# Patient Record
Sex: Female | Born: 1990 | Race: White | Hispanic: No | Marital: Single | Smoking: Current some day smoker
Health system: Southern US, Community
[De-identification: ages and names within clinical notes are randomized; demographics above are authoritative.]

## PROBLEM LIST (undated history)

## (undated) ENCOUNTER — Inpatient Hospital Stay (HOSPITAL_COMMUNITY): Payer: Self-pay

## (undated) DIAGNOSIS — F329 Major depressive disorder, single episode, unspecified: Secondary | ICD-10-CM

## (undated) DIAGNOSIS — K219 Gastro-esophageal reflux disease without esophagitis: Secondary | ICD-10-CM

## (undated) DIAGNOSIS — F1010 Alcohol abuse, uncomplicated: Secondary | ICD-10-CM

## (undated) DIAGNOSIS — F39 Unspecified mood [affective] disorder: Secondary | ICD-10-CM

## (undated) DIAGNOSIS — F32A Depression, unspecified: Secondary | ICD-10-CM

## (undated) HISTORY — DX: Major depressive disorder, single episode, unspecified: F32.9

## (undated) HISTORY — DX: Alcohol abuse, uncomplicated: F10.10

## (undated) HISTORY — DX: Unspecified mood (affective) disorder: F39

## (undated) HISTORY — DX: Depression, unspecified: F32.A

---
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
## 2009-07-28 ENCOUNTER — Inpatient Hospital Stay (HOSPITAL_COMMUNITY): Admission: EM | Admit: 2009-07-28 | Discharge: 2009-07-31 | Payer: Self-pay | Admitting: Emergency Medicine

## 2009-07-31 ENCOUNTER — Ambulatory Visit: Payer: Self-pay | Admitting: Psychiatry

## 2009-07-31 ENCOUNTER — Inpatient Hospital Stay (HOSPITAL_COMMUNITY): Admission: AD | Admit: 2009-07-31 | Discharge: 2009-08-02 | Payer: Self-pay | Admitting: Psychiatry

## 2009-09-28 ENCOUNTER — Ambulatory Visit: Payer: Self-pay

## 2009-12-20 ENCOUNTER — Observation Stay: Payer: Self-pay | Admitting: Internal Medicine

## 2010-03-08 ENCOUNTER — Ambulatory Visit: Payer: Self-pay | Admitting: Family Medicine

## 2010-03-12 DIAGNOSIS — F10982 Alcohol use, unspecified with alcohol-induced sleep disorder: Secondary | ICD-10-CM

## 2010-03-12 DIAGNOSIS — F329 Major depressive disorder, single episode, unspecified: Secondary | ICD-10-CM

## 2010-03-12 DIAGNOSIS — R634 Abnormal weight loss: Secondary | ICD-10-CM

## 2010-03-12 DIAGNOSIS — F1010 Alcohol abuse, uncomplicated: Secondary | ICD-10-CM

## 2010-03-12 LAB — CONVERTED CEMR LAB: TSH: 1.93 microintl units/mL (ref 0.35–5.50)

## 2010-03-13 ENCOUNTER — Encounter: Payer: Self-pay | Admitting: Family Medicine

## 2010-03-13 LAB — CONVERTED CEMR LAB
Chlamydia, Swab/Urine, PCR: NEGATIVE
HIV: NONREACTIVE

## 2010-04-12 ENCOUNTER — Ambulatory Visit: Admit: 2010-04-12 | Payer: Self-pay | Admitting: Family Medicine

## 2010-05-02 NOTE — Assessment & Plan Note (Signed)
Summary: NEW PT TO EST/CLE   Vital Signs:  Patient profile:   20 year old female Height:      65 inches Weight:      115.25 pounds BMI:     19.25 Temp:     96.7 degrees F oral Pulse rate:   72 / minute Pulse rhythm:   regular BP sitting:   120 / 84  (right arm) Cuff size:   regular  Vitals Entered By: Linde Gillis CMA Duncan Dull) (March 12, 2010 10:50 AM) CC: new patient, establish care   History of Present Illness: 20 yo here to establish care.  ETOH abuse- s/p recent inpatient rehab [facility] for [facility] days.  Attends AA meetings 5 x /week.  Dropped out of [employer] last semester due to 2 DUIs.  Trying to get her life back together, changing her circle of friends so she is not influenced by other drinkers. Father has been sober for 11 years.  Depression- h/o drug overdose on Tylenol PM  (intentional) s/p stay at [facility] in May 2011.  Says that she was upset because she has had multiple deaths of people close to her, including her grandfather and ex boyfriend in those months.  Also had been random drug tested and tested positive for Marijuana they day she attempted OD and that pushed her over the edge.  She was very upset that her parents would find out.  Insomnia- needs help sleeping.  Asking for Ambien.  Difficulty falling but can stay asleep.  This has been ongoing for months.  Difficulty gaining weight- ever since she has issues with ETOH, insomnia and depression, cant seem to gain weight.  Denies any symptoms of hypo or hyperthyroidism.  Sexually active with her boyfriend, on OCPS.  Wants to be tested for STDs.  Had pap last year at Dr. Neysa Hotter office.   Preventive Screening-Counseling & Management  Alcohol-Tobacco     Smoking Status: current  Current Medications (verified): 1)  Necon 1/35 (28) 1-35 Mg-Mcg Tabs (Norethindrone-Eth Estradiol) .... Take As Directed 2)  Remeron 15 Mg  Tabs (Mirtazapine) .Marland Kitchen.. 1 Qhs  Allergies (verified): No Known Drug  Allergies  Past History:  Past Medical History: Alcohol abuse- s/p inpatient rehab at [facility] in 2011, two [place]. Depression- h/o Tylenol pm overdose, subsequent admission to behavioral health  Family History: Family History of Alcoholism/Addiction  Social History: Single Current Smoker Smoking Status:  current  Review of Systems      See HPI General:  Complains of loss of appetite and sleep disorder. Eyes:  Denies blurring. ENT:  Denies difficulty swallowing. CV:  Denies chest pain or discomfort. Resp:  Denies shortness of breath. GI:  Denies abdominal pain and change in bowel habits. GU:  Denies abnormal vaginal bleeding, discharge, and dysuria. MS:  Denies joint pain, joint redness, and joint swelling. Derm:  Denies rash. Neuro:  Denies headaches. Psych:  Complains of anxiety and depression; denies sense of great danger, suicidal thoughts/plans, thoughts of violence, unusual visions or sounds, and thoughts /plans of harming others. Endo:  Denies cold intolerance and heat intolerance. Heme:  Denies abnormal bruising and bleeding.  Physical Exam  General:  alert, well-developed, and well-nourished.   Head:  normocephalic and atraumatic.   Eyes:  vision grossly intact, pupils equal, pupils round, and pupils reactive to light.   Ears:  no external deformities.   Nose:  no external deformity.   Mouth:  good dentition.   Neck:  No deformities, masses, or tenderness  noted. Psych:  Cognition and judgment appear intact. Alert and cooperative with normal attention span and concentration. 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  Cannot give her controlled substances at this point given her addiction and  recent SI attempt with sleep add. Will try Remeron given her loss of appetite.  See below.  Problem # 2:  WEIGHT LOSS (ICD-783.21) Assessment: New Most likely due to her depression and recent treatment for addiction. Will check TSH but will also start on Remeron 15 mg at bedtime.  Problem # 3:  ALCOHOL ABUSE (ICD-305.00) Assessment: Improved See above.  Encouraged her to continue going to [employer] Offered referral for psychotherapy, pt declined.  Problem # 4:  SCREENING EXAMINATION FOR VENEREAL DISEASE (ICD-V74.5) Assessment: New  Orders: T-Chlamydia & GC Probe, Urine (87491/[id number]) Venipuncture (23536) T-HIV Antibody  (Reflex) [id number]) T-RPR (Syphilis) (67619-50932)  Complete Medication List: 1)  Necon 1/35 (28) 1-35 Mg-mcg Tabs (Norethindrone-eth estradiol) .... Take as directed 2)  Remeron 15 Mg Tabs (Mirtazapine) .Marland Kitchen.. 1 qhs  Patient Instructions: 1)  Great to meet you. 2)  Please follow up in one month. Prescriptions: REMERON 15 MG  TABS (MIRTAZAPINE) 1 qhs  #30 x 0   Entered and Authorized by:   Ruthe Mannan MD   Signed by:   Ruthe Mannan MD on 03/12/2010   Method used:   Electronically to        [facility]* (retail)       [address]       [place], [place]  [address]       Ph: [phone]       Fax: [phone]   RxID:   [id number]    Orders Added: 1)  T-Chlamydia & GC Probe, Urine [87491/[id number]] 2)  Venipuncture [92426] 3)  T-HIV Antibody  (Reflex) [[id number]] 4)  T-RPR (Syphilis) [phone] 5)  New Patient Level IV [74081]    Prior Medications (reviewed today): None Current Allergies (reviewed today): No known allergies    TD Result Date:  03/03/2006 TD Result:  historical PAP Next Due:  1 yr   Appended Document: NEW PT TO EST/CLE

## 2010-06-18 LAB — APTT
aPTT: 27 seconds (ref 24–37)
aPTT: 27 seconds (ref 24–37)
aPTT: 27 seconds (ref 24–37)

## 2010-06-18 LAB — SALICYLATE LEVEL: Salicylate Lvl: <4 mg/dL (ref 2.8–20.0)

## 2010-06-18 LAB — COMPREHENSIVE METABOLIC PANEL
ALT: 15 U/L (ref 0–35)
AST: 18 U/L (ref 0–37)
AST: 20 U/L (ref 0–37)
AST: 24 U/L (ref 0–37)
Albumin: 2.9 g/dL — ABNORMAL LOW (ref 3.5–5.2)
Albumin: 3.3 g/dL — ABNORMAL LOW (ref 3.5–5.2)
Albumin: 3.4 g/dL — ABNORMAL LOW (ref 3.5–5.2)
Alkaline Phosphatase: 32 U/L — ABNORMAL LOW (ref 39–117)
Alkaline Phosphatase: 33 U/L — ABNORMAL LOW (ref 39–117)
Alkaline Phosphatase: 34 U/L — ABNORMAL LOW (ref 39–117)
BUN: 2 mg/dL — ABNORMAL LOW (ref 6–23)
BUN: 5 mg/dL — ABNORMAL LOW (ref 6–23)
BUN: 6 mg/dL (ref 6–23)
CO2: 21 mEq/L (ref 19–32)
Calcium: 8.4 mg/dL (ref 8.4–10.5)
Calcium: 8.8 mg/dL (ref 8.4–10.5)
Calcium: 8.8 mg/dL (ref 8.4–10.5)
Calcium: 8.6 mg/dL (ref 8.4–10.5)
Chloride: 117 mEq/L — ABNORMAL HIGH (ref 96–112)
Creatinine, Ser: 0.75 mg/dL (ref 0.4–1.2)
Creatinine, Ser: 0.77 mg/dL (ref 0.4–1.2)
Creatinine, Ser: 0.9 mg/dL (ref 0.4–1.2)
GFR calc Af Amer: >60 mL/min (ref >60)
GFR calc non Af Amer: >60 mL/min (ref >60)
GFR calc non Af Amer: >60 mL/min (ref >60)
GFR calc non Af Amer: >60 mL/min (ref >60)
Glucose, Bld: 109 mg/dL — ABNORMAL HIGH (ref 70–99)
Glucose, Bld: 82 mg/dL (ref 70–99)
Potassium: 3.5 mEq/L (ref 3.5–5.1)
Potassium: 3.7 mEq/L (ref 3.5–5.1)
Sodium: 143 mEq/L (ref 135–145)
Total Bilirubin: 1.1 mg/dL (ref 0.3–1.2)
Total Protein: 5.6 g/dL — ABNORMAL LOW (ref 6.0–8.3)
Total Protein: 5.9 g/dL — ABNORMAL LOW (ref 6.0–8.3)

## 2010-06-18 LAB — URINALYSIS, ROUTINE W REFLEX MICROSCOPIC
Bilirubin Urine: NEGATIVE
Glucose, UA: NEGATIVE mg/dL
Hgb urine dipstick: NEGATIVE
Protein, ur: NEGATIVE mg/dL
Urobilinogen, UA: 0.2 mg/dL (ref 0.0–1.0)
pH: 5 (ref 5.0–8.0)

## 2010-06-18 LAB — CBC
HCT: 37.2 % (ref 36.0–46.0)
HCT: 40.7 % (ref 36.0–46.0)
Hemoglobin: 12.6 g/dL (ref 12.0–15.0)
MCHC: 33.9 g/dL (ref 30.0–36.0)
MCHC: 33.7 g/dL (ref 30.0–36.0)
MCV: 94.7 fL (ref 78.0–100.0)
MCV: 93.7 fL (ref 78.0–100.0)
Platelets: 192 10*3/uL (ref 150–400)
Platelets: 255 10*3/uL (ref 150–400)
RBC: 3.94 MIL/uL (ref 3.87–5.11)
RBC: 4.34 MIL/uL (ref 3.87–5.11)
RDW: 13.3 % (ref 11.5–15.5)
RDW: 13.4 % (ref 11.5–15.5)
WBC: 13.6 10*3/uL — ABNORMAL HIGH (ref 4.0–10.5)
WBC: 8.1 10*3/uL (ref 4.0–10.5)

## 2010-06-18 LAB — ACETAMINOPHEN LEVEL
Acetaminophen (Tylenol), Serum: <10 ug/mL — ABNORMAL LOW (ref 10–30)
Acetaminophen (Tylenol), Serum: <10 ug/mL — ABNORMAL LOW (ref 10–30)
Acetaminophen (Tylenol), Serum: 48.8 ug/mL — ABNORMAL HIGH (ref 10–30)

## 2010-06-18 LAB — DIFFERENTIAL
Eosinophils Absolute: 0 10*3/uL (ref 0.0–0.7)
Eosinophils Relative: 0 % (ref 0–5)
Eosinophils Relative: 0 % (ref 0–5)
Lymphocytes Relative: 15 % (ref 12–46)
Lymphs Abs: 2 10*3/uL (ref 0.7–4.0)
Lymphs Abs: 1 10*3/uL (ref 0.7–4.0)
Monocytes Relative: 7 % (ref 3–12)
Neutro Abs: 10.7 10*3/uL — ABNORMAL HIGH (ref 1.7–7.7)
Neutro Abs: 5.7 10*3/uL (ref 1.7–7.7)

## 2010-06-18 LAB — MAGNESIUM: Magnesium: 2.1 mg/dL (ref 1.5–2.5)

## 2010-06-18 LAB — ETHANOL: Alcohol, Ethyl (B): 5 mg/dL (ref 0–10)

## 2010-06-18 LAB — PROTIME-INR
INR: 1.05 (ref 0.00–1.49)
INR: 1.13 (ref 0.00–1.49)
INR: 1.13 (ref 0.00–1.49)
Prothrombin Time: 13.6 seconds (ref 11.6–15.2)
Prothrombin Time: 14.4 seconds (ref 11.6–15.2)
Prothrombin Time: 14.4 seconds (ref 11.6–15.2)

## 2010-06-18 LAB — RAPID URINE DRUG SCREEN, HOSP PERFORMED
Amphetamines: NOT DETECTED
Benzodiazepines: POSITIVE — AB
Benzodiazepines: NOT DETECTED
Cocaine: NOT DETECTED
Opiates: NOT DETECTED
Tetrahydrocannabinol: NOT DETECTED
Tetrahydrocannabinol: NOT DETECTED

## 2010-06-18 LAB — MRSA PCR SCREENING: MRSA by PCR: NEGATIVE

## 2010-06-18 LAB — PHOSPHORUS: Phosphorus: 3.4 mg/dL (ref 2.3–4.6)

## 2010-08-13 ENCOUNTER — Encounter: Payer: Self-pay | Admitting: Family Medicine

## 2010-08-14 ENCOUNTER — Ambulatory Visit (INDEPENDENT_AMBULATORY_CARE_PROVIDER_SITE_OTHER): Payer: 59 | Admitting: Family Medicine

## 2010-08-14 ENCOUNTER — Other Ambulatory Visit: Payer: Self-pay | Admitting: Family Medicine

## 2010-08-14 ENCOUNTER — Encounter: Payer: Self-pay | Admitting: Family Medicine

## 2010-08-14 VITALS — BP 100/60 | HR 79 | Temp 98.3°F | Ht 65.0 in | Wt 115.8 lb

## 2010-08-14 DIAGNOSIS — Z331 Pregnant state, incidental: Secondary | ICD-10-CM

## 2010-08-14 HISTORY — DX: Pregnant state, incidental: Z33.1

## 2010-08-14 LAB — POCT URINE PREGNANCY: Preg Test, Ur: POSITIVE

## 2010-08-14 MED ORDER — PROMETHAZINE HCL 12.5 MG PO TABS: 12.5000 mg | ORAL_TABLET | Freq: Four times a day (QID) | ORAL | Status: AC | PRN

## 2010-08-14 NOTE — Progress Notes (Signed)
20 yo here for ?pregnancy.  Cannot remember when she had her LMP--?end of February.  Went to [facility] last month, pos pregnancy.  Pt wants to know her options.  Considering abortion.  Has had breast tenderness, nausea, vomiting.  Vomiting improved.   Finally starting to regain weight. Wt Readings from Last 3 Encounters:  08/14/10 115 lb 12.8 oz (52.527 kg)  03/12/10 115 lb 4 oz (52.277 kg)   She is taking a prenatal vitamin.    Cannot afford OB care at this point, applying for medicaid.  Review of Systems       See HPI  Physical Exam BP 100/60  Pulse 79  Temp(Src) 98.3 F (36.8 C) (Oral)  Ht 5\' 5"  (1.651 m)  Wt 115 lb 12.8 oz (52.527 kg)  BMI 19.27 kg/m2  LMP 05/25/2010  General:  alert, well-developed, and well-nourished.   Head:  normocephalic and atraumatic.   Psych:  Cognition and judgment appear intact. Alert and cooperative with normal attention span and concentration. No apparent delusions, illusions, hallucinations  A/P- pregnancy. Likely around12 weeks or greater. Will order OB ultrasound for dating although discussed with pt and her mother that this may not be as accurate as it would have been earlier on in pregnancy. Refer to [facility] for prenatal care and to discuss options.

## 2010-08-15 ENCOUNTER — Ambulatory Visit (HOSPITAL_COMMUNITY)
Admission: RE | Admit: 2010-08-15 | Discharge: 2010-08-15 | Disposition: A | Discharge status: Home / Self Care | Payer: 59 | Source: Ambulatory Visit | Attending: Family Medicine | Admitting: Family Medicine

## 2010-08-15 ENCOUNTER — Other Ambulatory Visit (HOSPITAL_COMMUNITY): Payer: 59

## 2010-08-15 DIAGNOSIS — Z3689 Encounter for other specified antenatal screening: Secondary | ICD-10-CM | POA: Insufficient documentation

## 2010-08-15 DIAGNOSIS — Z331 Pregnant state, incidental: Secondary | ICD-10-CM

## 2010-08-23 ENCOUNTER — Other Ambulatory Visit: Payer: 59

## 2010-09-18 ENCOUNTER — Ambulatory Visit (INDEPENDENT_AMBULATORY_CARE_PROVIDER_SITE_OTHER): Payer: 59 | Admitting: Family Medicine

## 2010-09-18 ENCOUNTER — Encounter: Payer: Self-pay | Admitting: Family Medicine

## 2010-09-18 VITALS — BP 120/80 | HR 76 | Temp 97.6°F | Ht 65.0 in | Wt 111.2 lb

## 2010-09-18 DIAGNOSIS — R21 Rash and other nonspecific skin eruption: Secondary | ICD-10-CM

## 2010-09-18 MED ORDER — FLUOCINONIDE 0.05 % EX CREA: TOPICAL_CREAM | CUTANEOUS | Status: DC

## 2010-09-18 NOTE — Patient Instructions (Signed)
Good to see you. Try taking Benadryl or Zyrtec for next several days (follow instructions on bottle). Apply lidex to legs daily. Call me on Friday with an update.

## 2010-09-18 NOTE — Progress Notes (Signed)
20 yo here for rash behind legs.  Recently had an abortion, no complications. Emotionally feels good about things.  3 days ago, acute onset of very itchy rash behind her knees bilaterally. Has not spread but is not getting better. No rash elsewhere. No wheezing or SOB. Has no known allergies. Was at the swimming pool that day, rash appeared at night. No new lotions or detergents.  Has not taken any antihistamines.  Review of Systems       See HPI  Physical Exam BP 120/80  Pulse 76  Temp(Src) 97.6 F (36.4 C) (Oral)  Ht 5\' 5"  (1.651 m)  Wt 111 lb 4 oz (50.463 kg)  BMI 18.51 kg/m2  General:  alert, well-developed, and well-nourished.   Head:  normocephalic and atraumatic.   Psych:  Cognition and judgment appear intact. Alert and cooperative with normal attention span and concentration. No apparent delusions, illusions, hallucinations Skin:  Multiple urticaria behind knees bilaterally, otherwise no rashes or discoloration  A/P: 1. Rash   New. Consistent with contact/allergic dermatitis. Will place on antihistamine, apply topical lidex. See pt instructions for details. The patient and her mother indicate understanding of these issues and agrees with the plan.

## 2010-10-16 ENCOUNTER — Ambulatory Visit: Payer: 59 | Admitting: Obstetrics & Gynecology

## 2010-10-16 DIAGNOSIS — Z01419 Encounter for gynecological examination (general) (routine) without abnormal findings: Secondary | ICD-10-CM

## 2010-10-16 NOTE — Progress Notes (Deleted)
  Subjective:    Patient ID: Doris Roberson, female    DOB: [birth year], 20 y.o.   MRN: [id number]  HPI    Review of Systems     Objective:   Physical Exam        Assessment & Plan:

## 2011-01-15 ENCOUNTER — Ambulatory Visit (INDEPENDENT_AMBULATORY_CARE_PROVIDER_SITE_OTHER): Payer: 59 | Admitting: Family Medicine

## 2011-01-15 ENCOUNTER — Encounter: Payer: Self-pay | Admitting: Family Medicine

## 2011-01-15 VITALS — BP 120/70 | HR 76 | Temp 98.5°F | Ht 65.0 in | Wt 125.5 lb

## 2011-01-15 DIAGNOSIS — F39 Unspecified mood [affective] disorder: Secondary | ICD-10-CM

## 2011-01-15 DIAGNOSIS — F1910 Other psychoactive substance abuse, uncomplicated: Secondary | ICD-10-CM

## 2011-01-15 DIAGNOSIS — F1010 Alcohol abuse, uncomplicated: Secondary | ICD-10-CM | POA: Insufficient documentation

## 2011-01-15 DIAGNOSIS — Z79899 Other long term (current) drug therapy: Secondary | ICD-10-CM

## 2011-01-15 LAB — CBC WITH DIFFERENTIAL/PLATELET
Basophils Relative: 0.7 % (ref 0.0–3.0)
Eosinophils Relative: 2.1 % (ref 0.0–5.0)
HCT: 40.1 % (ref 36.0–46.0)
Hemoglobin: 13.6 g/dL (ref 12.0–15.0)
Lymphocytes Relative: 24 % (ref 12.0–46.0)
Lymphs Abs: 2.1 10*3/uL (ref 0.7–4.0)
Monocytes Relative: 5.4 % (ref 3.0–12.0)
Neutro Abs: 6 10*3/uL (ref 1.4–7.7)
RBC: 4.39 Mil/uL (ref 3.87–5.11)
WBC: 8.8 10*3/uL (ref 4.5–10.5)

## 2011-01-15 LAB — HEPATIC FUNCTION PANEL
Albumin: 4 g/dL (ref 3.5–5.2)
Alkaline Phosphatase: 49 U/L (ref 39–117)
Total Protein: 6.8 g/dL (ref 6.0–8.3)

## 2011-01-15 MED ORDER — QUETIAPINE FUMARATE 100 MG PO TABS: 100.0000 mg | ORAL_TABLET | Freq: Every day | ORAL | Status: DC

## 2011-01-15 NOTE — Patient Instructions (Signed)
Hang in there. I am very proud of you. Please stop by to see Doris Roberson after you to go the lab.

## 2011-01-15 NOTE — Progress Notes (Signed)
  Subjective:    Patient ID: Doris Roberson, female    DOB: 1991/02/07, 20 y.o.   MRN: [id number]  HPI  20 yo here for follow up.  Was at an inpatient treatment facility for ETOH abuse 12/16/2010- 01/04/2011. 31 days sober, attending AA meetings religiously.  She was also started on Seroquel 100 mg nightly and Lamictal 100 25 mg daily. She is unsure why they were started. Denies any symptoms of mania.  Does endorse feeling anxious. Feels a little better with these medications but at times has a tremor in her hands. Denies any SI or HI.  No follow up scheduled with a psychiatrist.  Patient Active Problem List  Diagnoses  . ALCOHOL INDUCED SLEEP DISORDERS  . ALCOHOL ABUSE  . DEPRESSION  . WEIGHT LOSS  . Pregnant state, incidental  . Rash  . Alcohol abuse  . Mood disorder   Past Medical History  Diagnosis Date  . Alcohol abuse     s/p inpatient rehab at [facility] in 2011, two [place]  . Depression     h/o Tylenol pm overdose, subsequent admision to behavioral health  . Alcohol abuse   . Mood disorder    No past surgical history on file. History  Substance Use Topics  . Smoking status: Current Everyday Smoker  . Smokeless tobacco: Not on file  . Alcohol Use:    Family History  Problem Relation Age of Onset  . Alcohol abuse Other    No Known Allergies Current Outpatient Prescriptions on File Prior to Visit  Medication Sig Dispense Refill  . mirtazapine (REMERON) 15 MG tablet Take 15 mg by mouth at bedtime.        Kathrynn Running Estrad-Fe Biphas (LO LOESTRIN FE) 1 MG-10 MCG / 10 MCG TABS Take 1 tablet by mouth daily.         The PMH, PSH, Social History, Family History, Medications, and allergies have been reviewed in [facility], and have been updated if relevant.   Review of Systems    See HPI Denies focal weakness, severe memory loss, concerning skin lesions, depression, anxiety, abnormal bruising/bleeding, major joint swelling, breast masses or abnormal vaginal  bleeding.    Objective:   Physical Exam BP 120/70  Pulse 76  Temp(Src) 98.5 F (36.9 C) (Oral)  Ht 5\' 5"  (1.651 m)  Wt 125 lb 8 oz (56.926 kg)  BMI 20.88 kg/m2  LMP 12/25/2010  General:  Well-developed,well-nourished,in no acute distress; alert,appropriate and cooperative throughout examination Head:  normocephalic and atraumatic.   Neurologic:  alert & oriented X3 and gait normal, no tremor visible.  Skin:  Intact without suspicious lesions or rashes Psych:  Cognition and judgment appear intact. Alert and cooperative with normal attention span and concentration. No apparent delusions, illusions, hallucinations    Assessment & Plan:   1. Alcohol abuse  >25 min spent with face to face with patient, >50% counseling and/or coordinating care Congratulated her on being sober! Ambulatory referral to Psychiatry  2. Mood disorder  NOS- unclear diagnosis and I explained to [name] and her mother that I am not comfortable prescribing Seroquel and Lamictal on a regular basis without having a clear diagnosis.  Refer to psychiatry.  They agree with plan. Given Rx for seroquel as she has no further refills. Ambulatory referral to Psychiatry  3. Long term use of drug  Hepatic function panel, CBC w/Diff

## 2011-11-05 ENCOUNTER — Telehealth: Payer: Self-pay

## 2011-11-05 NOTE — Telephone Encounter (Signed)
pts mother left v/m pt going to school in 2 weeks and needs tetanus shot; health dept cannot give today and pt has to get today due to pt only day off. Left v/m for pts mother to call back.

## 2011-11-13 NOTE — Telephone Encounter (Signed)
Spoke with pts mother; she did call back and not sure who she spoke with but pt did not need immunization.

## 2012-08-30 IMAGING — US US OB COMP LESS 14 WK
1 series · 14 of 28 positions shown · non-contrast
Comparison: none

[Series 1: us ob comp less 14 wks · 34 acquisitions, 14 frames shown]
[im 2/34]
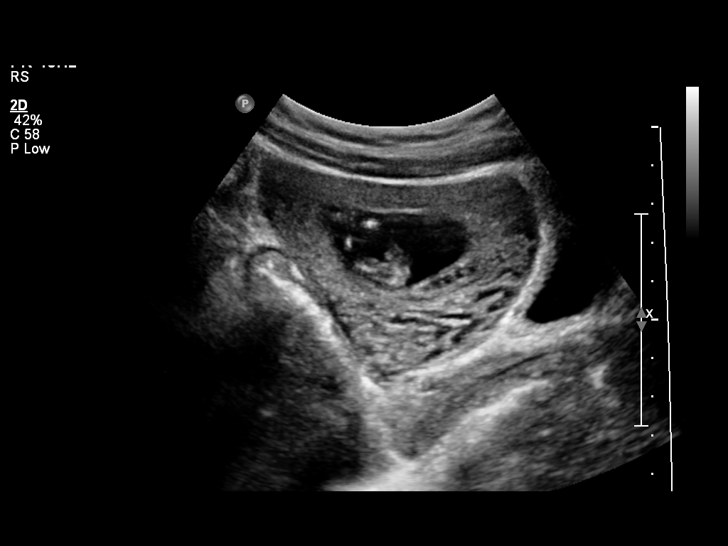
[im 4/34]
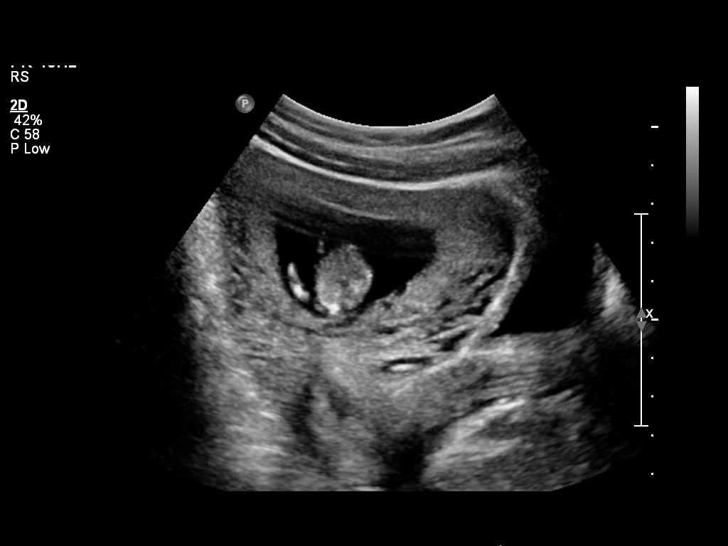
[im 7/34]
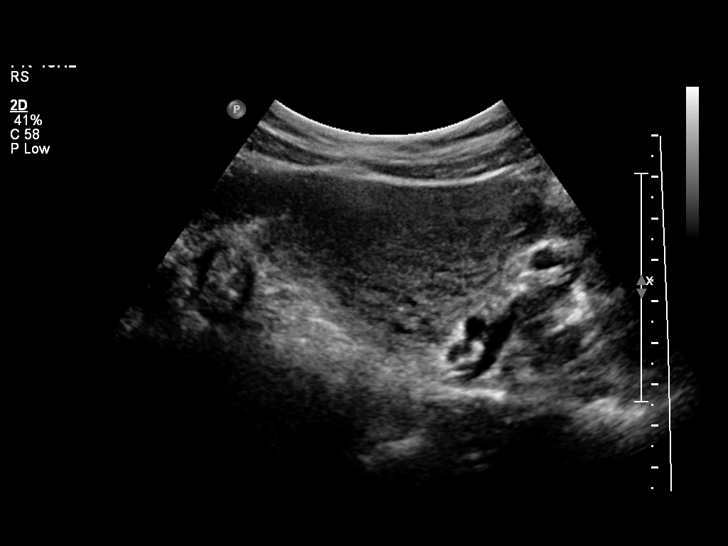
[im 9/34]
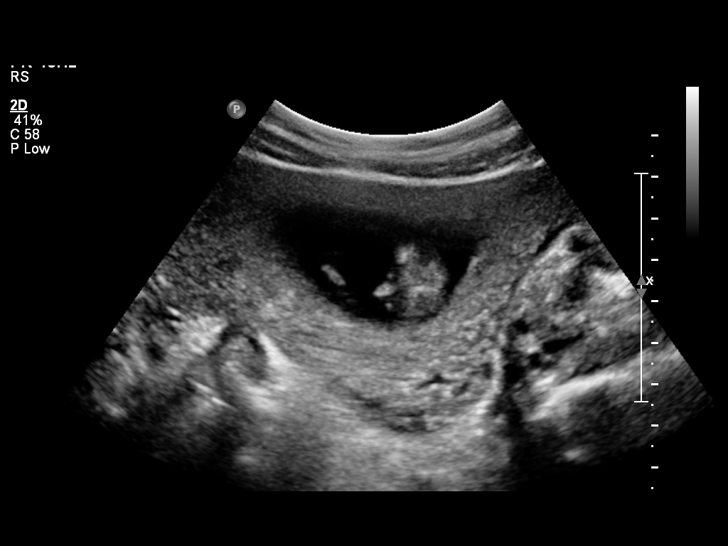
[im 12/34]
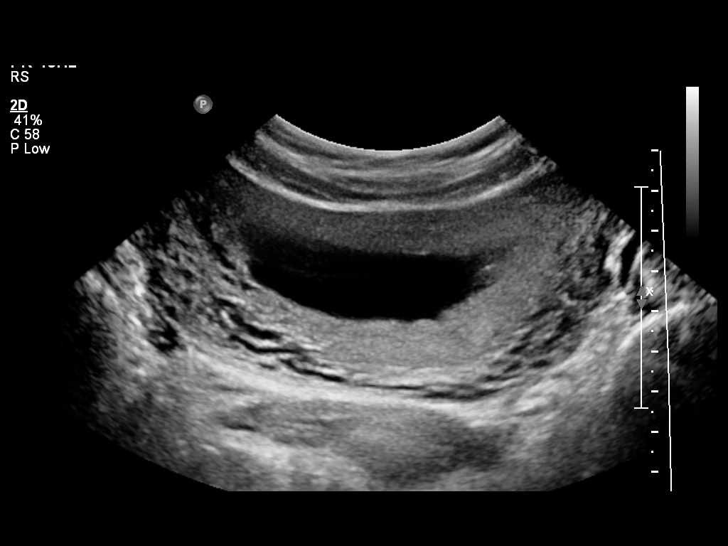
[im 14/34]
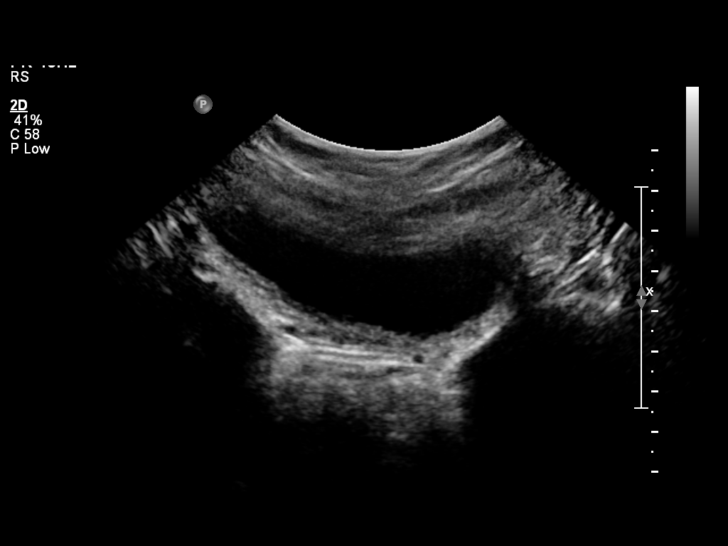
[im 16/34]
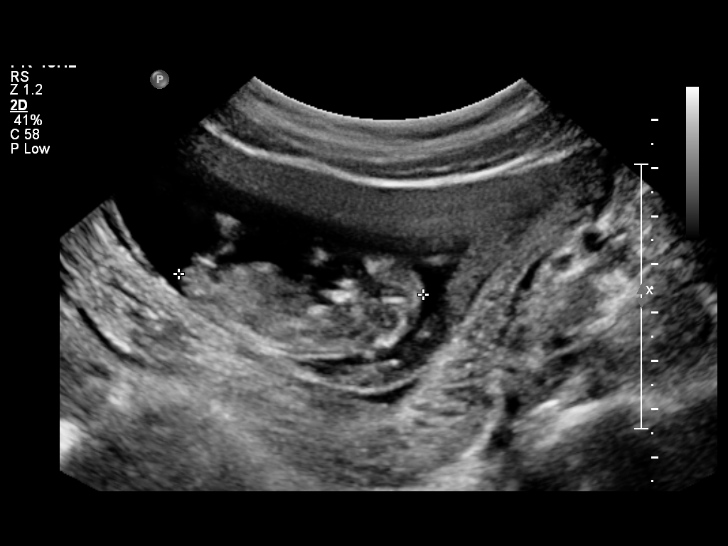
[im 19/34]
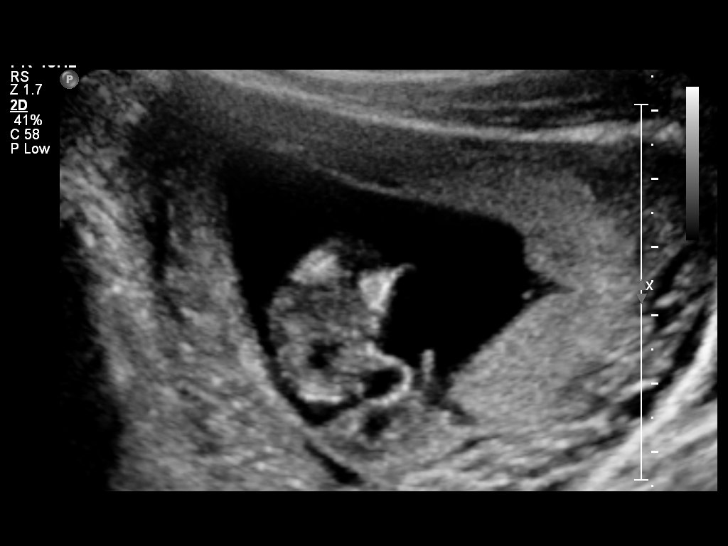
[im 21/34]
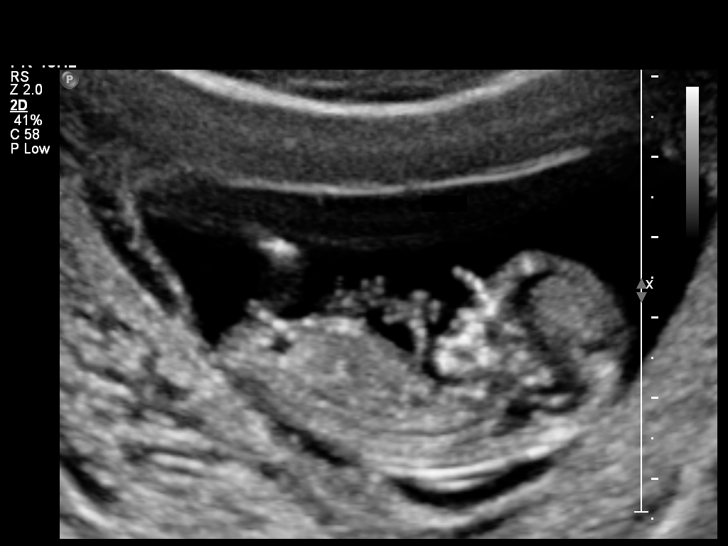
[im 24/34]
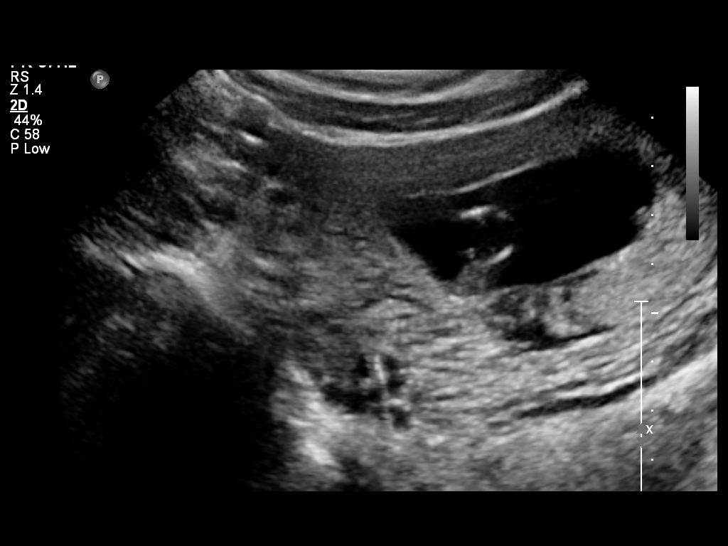
[im 26/34]
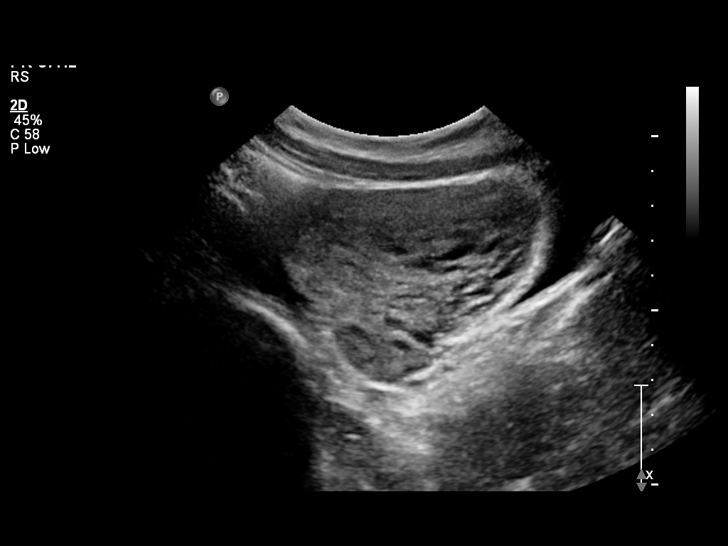
[im 29/34]
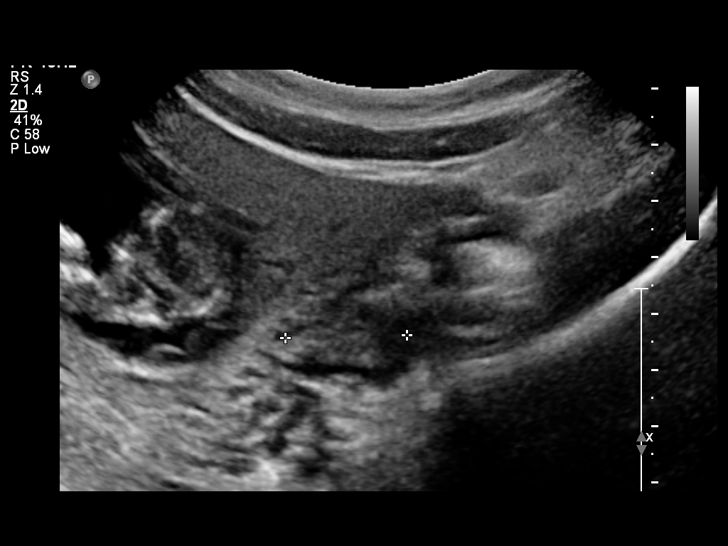
[im 31/34]
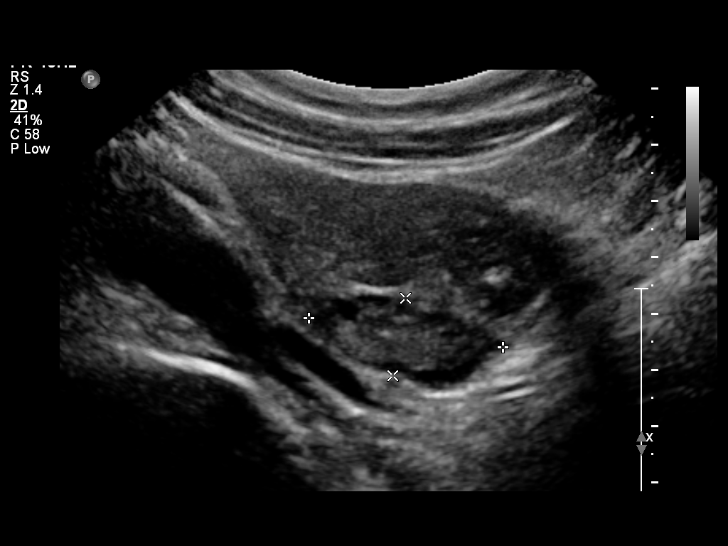
[im 34/34]
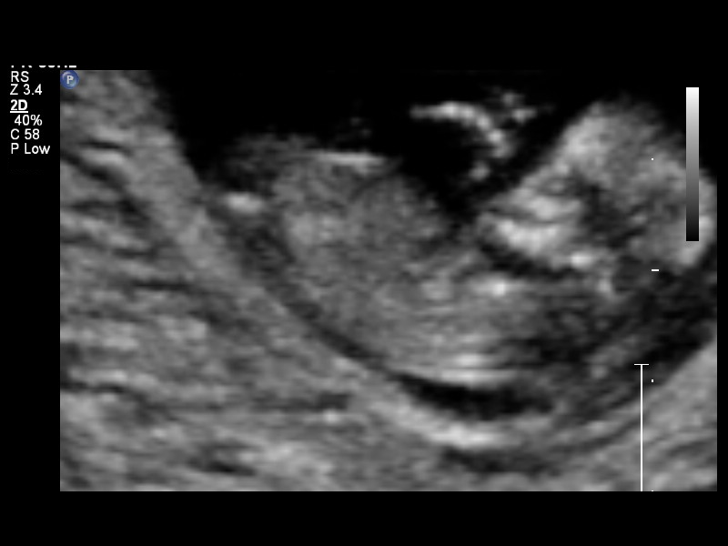

[14 of 28 positions shown; findings below may reference images not displayed]

OBSTETRICS REPORT
                      (Signed Final 08/15/2010 [DATE])

 Order#:         [id number]
Procedures

 US OB COMP LESS 14 WKS                                76801.0
Indications

 Uncertain LMP;  Establish Gestational [AGE]
Fetal Evaluation

 Preg. Location:    Intrauterine
 Gest. Sac:         Intrauterine
 Yolk Sac:          Visualized
 Fetal Pole:        Visualized
 Fetal Heart Rate:  158                          bpm
 Cardiac Activity:  Observed
Biometry

 CRL:     50.9  mm     G. Age:  11w 5d                 EDD:    03/01/11
Gestational Age

 LMP:           11w 5d        Date:  05/25/10                 EDD:   03/01/11
 Best:          11w 5d     Det. By:  LMP  (05/25/10)          EDD:   03/01/11
Cervix Uterus Adnexa

 Cervix:       Closed.
 Left Ovary:    Within normal limits measuring 3.5 x 1.4 x 2.1 cm.
 Right Ovary:   Within normal limits measuring 1.3 x 2.8 x 1.5 cm.

 Adnexa:     No abnormality visualized.
Impression

 There is a single living intrauterine pregancy demonstrating
 an EGA by CRL of  11w 5d. This correlates well with
 expected EGA by LMP of 11w 5d  .

 Normal ovaries.

## 2015-10-05 ENCOUNTER — Other Ambulatory Visit: Payer: Self-pay | Admitting: Obstetrics and Gynecology

## 2015-10-05 ENCOUNTER — Ambulatory Visit
Admission: RE | Admit: 2015-10-05 | Discharge: 2015-10-05 | Disposition: A | Discharge status: Home / Self Care | Payer: Medicaid Other | Source: Ambulatory Visit | Attending: Obstetrics and Gynecology | Admitting: Obstetrics and Gynecology

## 2015-10-05 DIAGNOSIS — O26842 Uterine size-date discrepancy, second trimester: Secondary | ICD-10-CM

## 2015-10-05 DIAGNOSIS — O99330 Smoking (tobacco) complicating pregnancy, unspecified trimester: Secondary | ICD-10-CM | POA: Insufficient documentation

## 2015-10-05 DIAGNOSIS — Z3480 Encounter for supervision of other normal pregnancy, unspecified trimester: Secondary | ICD-10-CM | POA: Insufficient documentation

## 2015-10-05 DIAGNOSIS — O468X1 Other antepartum hemorrhage, first trimester: Secondary | ICD-10-CM | POA: Diagnosis not present

## 2015-10-05 DIAGNOSIS — Z3A10 10 weeks gestation of pregnancy: Secondary | ICD-10-CM | POA: Diagnosis not present

## 2015-10-05 DIAGNOSIS — Z3483 Encounter for supervision of other normal pregnancy, third trimester: Secondary | ICD-10-CM

## 2015-10-05 DIAGNOSIS — Z3481 Encounter for supervision of other normal pregnancy, first trimester: Secondary | ICD-10-CM | POA: Diagnosis present

## 2015-10-05 HISTORY — DX: Smoking (tobacco) complicating pregnancy, unspecified trimester: O99.330

## 2015-10-05 LAB — OB RESULTS CONSOLE RPR: RPR: NONREACTIVE

## 2015-10-05 LAB — OB RESULTS CONSOLE HEPATITIS B SURFACE ANTIGEN: HEP B S AG: NEGATIVE

## 2015-10-08 ENCOUNTER — Other Ambulatory Visit: Payer: Self-pay | Admitting: Obstetrics and Gynecology

## 2015-10-08 DIAGNOSIS — Z369 Encounter for antenatal screening, unspecified: Secondary | ICD-10-CM

## 2015-10-18 ENCOUNTER — Ambulatory Visit: Payer: Medicaid Other

## 2015-10-22 ENCOUNTER — Ambulatory Visit
Admission: RE | Admit: 2015-10-22 | Discharge: 2015-10-22 | Disposition: A | Discharge status: Home / Self Care | Payer: Medicaid Other | Source: Ambulatory Visit | Attending: Obstetrics and Gynecology | Admitting: Obstetrics and Gynecology

## 2015-10-22 DIAGNOSIS — Z369 Encounter for antenatal screening, unspecified: Secondary | ICD-10-CM | POA: Insufficient documentation

## 2015-10-22 DIAGNOSIS — Z3481 Encounter for supervision of other normal pregnancy, first trimester: Secondary | ICD-10-CM | POA: Insufficient documentation

## 2015-10-22 DIAGNOSIS — Z36 Encounter for antenatal screening of mother: Secondary | ICD-10-CM

## 2015-10-22 DIAGNOSIS — N8312 Corpus luteum cyst of left ovary: Secondary | ICD-10-CM | POA: Diagnosis not present

## 2015-10-22 DIAGNOSIS — Z3A12 12 weeks gestation of pregnancy: Secondary | ICD-10-CM | POA: Diagnosis not present

## 2015-10-22 DIAGNOSIS — O208 Other hemorrhage in early pregnancy: Secondary | ICD-10-CM | POA: Diagnosis not present

## 2015-10-22 DIAGNOSIS — Z818 Family history of other mental and behavioral disorders: Secondary | ICD-10-CM | POA: Diagnosis not present

## 2015-10-22 HISTORY — DX: Encounter for antenatal screening, unspecified: Z36.9

## 2015-10-22 NOTE — Progress Notes (Signed)
Doris Wells, MS, CGC performed an integral service incident to the physician's initial service.  I was physically present in the clinical area and was immediately available to render assistance.   Nivan Melendrez C Michie Molnar  

## 2015-10-22 NOTE — Progress Notes (Signed)
Referring physician:  [facility] Length of Consultation: 45 minutes   Doris Roberson  was referred to [facility] for genetic counseling to review prenatal screening and testing options and to discuss the family history of autism and hydrocephaly.  This note summarizes the information we discussed.    We offered the following routine screening tests for this pregnancy:  First trimester screening, which includes nuchal translucency ultrasound screen and first trimester maternal serum marker screening.  The nuchal translucency has approximately an 80% detection rate for Down syndrome and can be positive for other chromosome abnormalities as well as congenital heart defects.  When combined with a maternal serum marker screening, the detection rate is up to 90% for Down syndrome and up to 97% for trisomy 18.     Maternal serum marker screening, a blood test that measures pregnancy proteins, can provide risk assessments for Down syndrome, trisomy 18, and open neural tube defects (spina bifida, anencephaly). Because it does not directly examine the fetus, it cannot positively diagnose or rule out these problems.  Targeted ultrasound uses high frequency sound waves to create an image of the developing fetus.  An ultrasound is often recommended as a routine means of evaluating the pregnancy.  It is also used to screen for fetal anatomy problems (for example, a heart defect) that might be suggestive of a chromosomal or other abnormality.   Should these screening tests indicate an increased concern, then the following additional testing options would be offered:  The chorionic villus sampling procedure is available for first trimester chromosome analysis.  This involves the withdrawal of a small amount of chorionic villi (tissue from the developing placenta).  Risk of pregnancy loss is estimated to be approximately 1 in 200 to 1 in 100 (0.5 to 1%).  There is approximately a 1% (1  in 100) chance that the CVS chromosome results will be unclear.  Chorionic villi cannot be tested for neural tube defects.     Amniocentesis involves the removal of a small amount of amniotic fluid from the sac surrounding the fetus with the use of a thin needle inserted through the maternal abdomen and uterus.  Ultrasound guidance is used throughout the procedure.  Fetal cells from amniotic fluid are directly evaluated and > 99.5% of chromosome problems and > 98% of open neural tube defects can be detected. This procedure is generally performed after the 15th week of pregnancy.  The main risks to this procedure include complications leading to miscarriage in less than 1 in 200 cases (0.5%).  As another option for information if the pregnancy is suspected to be an an increased chance for certain chromosome conditions, we also reviewed the availability of cell free fetal DNA testing from maternal blood to determine whether or not the baby may have either Down syndrome, trisomy 67, or trisomy 69.  This test utilizes a maternal blood sample and DNA sequencing technology to isolate circulating cell free fetal DNA from maternal plasma.  The fetal DNA can then be analyzed for DNA sequences that are derived from the three most common chromosomes involved in aneuploidy, chromosomes 13, 18, and 21.  If the overall amount of DNA is greater than the expected level for any of these chromosomes, aneuploidy is suspected.  While we do not consider it a replacement for invasive testing and karyotype analysis, a negative result from this testing would be reassuring, though not a guarantee of a normal chromosome complement for the baby.  An abnormal  result is certainly suggestive of an abnormal chromosome complement, though we would still recommend CVS or amniocentesis to confirm any findings from this testing.  Cystic Fibrosis and Spinal Muscular Atrophy (SMA) screening were also discussed with the patient. Both conditions are  recessive, which means that both parents must be carriers in order to have a child with the disease.  Cystic fibrosis (CF) is one of the most common genetic conditions in persons of Caucasian ancestry.  This condition occurs in approximately 1 in 2,500 Caucasian persons and results in thickened secretions in the lungs, digestive, and reproductive systems.  For a baby to be at risk for having CF, both of the parents must be carriers for this condition.  Approximately 1 in 57 Caucasian persons is a carrier for CF.  Current carrier testing looks for the most common mutations in the gene for CF and can detect approximately 90% of carriers in the Caucasian population.  This means that the carrier screening can greatly reduce, but cannot eliminate, the chance for an individual to have a child with CF.  If an individual is found to be a carrier for CF, then carrier testing would be available for the partner. As part of [place]'s newborn screening profile, all babies born in the state of [place] will have a two-tier screening process.  Specimens are first tested to determine the concentration of immunoreactive trypsinogen (IRT).  The top 5% of specimens with the highest IRT values then undergo DNA testing using a panel of over 40 common CF mutations. SMA is a neurodegenerative disorder that leads to atrophy of skeletal muscle and overall weakness.  This condition is also more prevalent in the Caucasian population, with 1 in 40-1 in 60 persons being a carrier and 1 in 6,000-1 in 10,000 children being affected.  There are multiple forms of the disease, with some causing death in infancy to other forms with survival into adulthood.  The genetics of SMA is complex, but carrier screening can detect up to 95% of carriers in the Caucasian population.  Similar to CF, a negative result can greatly reduce, but cannot eliminate, the chance to have a child with SMA.  We obtained a detailed family history and pregnancy  history.  This is the second pregnancy for the patient, her first was an elective termination for personal reasons.  The father of this pregnancy reported a paternal half sister with autism.  She is 56 years old and has no physical differences or birth defects.  He is not aware of any genetic testing that may have been performed to learn about the cause for her autism.  The term autism may include a spectrum of disorders from Asperger syndrome (with mild features) to more severe autistic features and developmental disabilities.  There may be many different causes for autism, some of which are inherited and others that are not well understood.  In order to determine the chance for this pregnancy or other family members to have a similar condition, one must first determine the cause for the condition in the affected family member.  When the specific cause is not known, this risk assessment is much more difficult.  For this reason, we would encourage a pediatric medical genetics evaluation on his sister.  Among the inherited causes for autism spectrum disorders, developmental delay or mental retardation are single gene disorders, chromosome conditions, numerous syndromes and multifactorial conditions.  Each of these may have a different recurrence risk within a family.  We discussed  Fragile X syndrome, but did not offer carrier screening to the father of the baby, as this is a paternal half sister and they could not share an X chromosome.  The father of the baby also reported one paternal first cousin with hydrocephaly.  She is now an adult but he knows little medical information about her condition or any testing that may have been performed.  Hydrocephaly can also have many causes.  Without information about the type and cause, it is hard to determine the level of concern for other family members.  In the absence of a genetic syndrome as the cause, the recurrence risk for this pregnancy, a fourth degree relative, is  likely low.  Ultrasound in the second and third trimester can be used to assess for hydrocephaly. Lastly, he reported one maternal uncle with Cherlynn Polo disease, or ALS.  Approximately 90% of cases of this condition are sporadic in a family and are thought to be due to multiple factors including environmental as well as inherited components.  In less than 10% of cases, there appears to be a familial cause.  If this uncle had any genetic testing, we are happy to discuss this further, though given the lack of other affected family members, recurrence is expected to be low.  The remainder of the family history is unremarkable for birth defects, developmental differences or known genetic conditions.  Ms. Kiddoo reported no complications during this pregnancy.  She reported taking medications including Remeron and trazodone prior to [redacted] weeks gestation when she learned that she was pregnant.  While there is limited data on the use of these medications during pregnancy, they have not been shown to increase the risk for birth defects.  Ms. Coblentz is also trying to stop smoking.  She has cut back from 1 pack per day to 8-10 cigarettes each day.  As we discussed, smoking during pregnancy has been associated with low birth weight, premature delivery and pregnancy loss.  For this reason, we suggest that she cut back or avoid smoking for the remainder of the pregnancy.  After consideration of the options, Ms. Kane elected to proceed with first trimester screening and to declined both CF and SMA carrier screening.  An ultrasound was performed at the time of the visit.  The gestational age was consistent with  12 weeks.  Fetal anatomy could not be assessed due to early gestational age.  Please refer to the ultrasound report for details of that study.  Ms. Folz was encouraged to call with questions or concerns.  We can be contacted at [phone].    Cherly Anderson, MS, CGC

## 2015-10-25 ENCOUNTER — Telehealth: Payer: Self-pay | Admitting: Obstetrics and Gynecology

## 2015-10-25 NOTE — Telephone Encounter (Signed)
Ms. Szabo  elected to undergo First Trimester screening on 10/22/15.  To review, first trimester screening, includes nuchal translucency ultrasound screen and/or first trimester maternal serum marker screening.  The nuchal translucency has approximately an 80% detection rate for Down syndrome and can be positive for other chromosome abnormalities as well as heart defects.  When combined with a maternal serum marker screening, the detection rate is up to 90% for Down syndrome and up to 97% for trisomy 13 and 18.     The results of the First Trimester Nuchal Translucency and Biochemical Screening were within normal range.  The risk for Down syndrome is now estimated to be less than 1 in 10,000.  The risk for Trisomy 13/18 is also estimated to be 1 in 10,000.  Should more definitive information be desired, we would offer amniocentesis.  Because we do not yet know the effectiveness of combined first and second trimester screening, we do not recommend a maternal serum screen to assess the chance for chromosome conditions.  However, if screening for neural tube defects is desired, maternal serum screening for AFP only can be performed between 15 and [redacted] weeks gestation.    Cherly Anderson, MS, CGC

## 2016-01-31 DIAGNOSIS — O26892 Other specified pregnancy related conditions, second trimester: Secondary | ICD-10-CM | POA: Insufficient documentation

## 2016-01-31 DIAGNOSIS — O99333 Smoking (tobacco) complicating pregnancy, third trimester: Secondary | ICD-10-CM

## 2016-01-31 DIAGNOSIS — Z6791 Unspecified blood type, Rh negative: Secondary | ICD-10-CM | POA: Insufficient documentation

## 2016-01-31 HISTORY — DX: Other specified pregnancy related conditions, second trimester: O26.892

## 2016-01-31 HISTORY — DX: Smoking (tobacco) complicating pregnancy, third trimester: O99.333

## 2016-03-15 DIAGNOSIS — O210 Mild hyperemesis gravidarum: Secondary | ICD-10-CM | POA: Insufficient documentation

## 2016-03-15 HISTORY — DX: Mild hyperemesis gravidarum: O21.0

## 2016-03-28 ENCOUNTER — Inpatient Hospital Stay (HOSPITAL_COMMUNITY)
Admission: AD | Admit: 2016-03-28 | Discharge: 2016-03-28 | Disposition: A | Discharge status: Home / Self Care | Payer: Medicaid Other | Source: Ambulatory Visit | Attending: Obstetrics and Gynecology | Admitting: Obstetrics and Gynecology

## 2016-03-28 ENCOUNTER — Encounter (HOSPITAL_COMMUNITY): Payer: Self-pay | Admitting: *Deleted

## 2016-03-28 DIAGNOSIS — O99513 Diseases of the respiratory system complicating pregnancy, third trimester: Secondary | ICD-10-CM | POA: Insufficient documentation

## 2016-03-28 DIAGNOSIS — J069 Acute upper respiratory infection, unspecified: Secondary | ICD-10-CM | POA: Insufficient documentation

## 2016-03-28 DIAGNOSIS — O99333 Smoking (tobacco) complicating pregnancy, third trimester: Secondary | ICD-10-CM | POA: Diagnosis not present

## 2016-03-28 DIAGNOSIS — R030 Elevated blood-pressure reading, without diagnosis of hypertension: Secondary | ICD-10-CM | POA: Insufficient documentation

## 2016-03-28 DIAGNOSIS — Z3A35 35 weeks gestation of pregnancy: Secondary | ICD-10-CM | POA: Diagnosis not present

## 2016-03-28 DIAGNOSIS — O26893 Other specified pregnancy related conditions, third trimester: Secondary | ICD-10-CM | POA: Diagnosis not present

## 2016-03-28 DIAGNOSIS — R05 Cough: Secondary | ICD-10-CM | POA: Diagnosis present

## 2016-03-28 DIAGNOSIS — O9989 Other specified diseases and conditions complicating pregnancy, childbirth and the puerperium: Secondary | ICD-10-CM | POA: Diagnosis not present

## 2016-03-28 DIAGNOSIS — F17210 Nicotine dependence, cigarettes, uncomplicated: Secondary | ICD-10-CM | POA: Insufficient documentation

## 2016-03-28 LAB — URINALYSIS, ROUTINE W REFLEX MICROSCOPIC
Bilirubin Urine: NEGATIVE
Glucose, UA: NEGATIVE mg/dL
Hgb urine dipstick: NEGATIVE
Ketones, ur: NEGATIVE mg/dL
Leukocytes, UA: NEGATIVE
Nitrite: NEGATIVE
Protein, ur: NEGATIVE mg/dL
Specific Gravity, Urine: 1.011 (ref 1.005–1.030)
pH: 6 (ref 5.0–8.0)

## 2016-03-28 LAB — COMPREHENSIVE METABOLIC PANEL
ALT: 18 U/L (ref 14–54)
ANION GAP: 6 (ref 5–15)
AST: 19 U/L (ref 15–41)
Albumin: 3.3 g/dL — ABNORMAL LOW (ref 3.5–5.0)
Alkaline Phosphatase: 170 U/L — ABNORMAL HIGH (ref 38–126)
BUN: 8 mg/dL (ref 6–20)
CHLORIDE: 106 mmol/L (ref 101–111)
CO2: 24 mmol/L (ref 22–32)
Calcium: 9.7 mg/dL (ref 8.9–10.3)
Creatinine, Ser: 0.4 mg/dL — ABNORMAL LOW (ref 0.44–1.00)
Glucose, Bld: 74 mg/dL (ref 65–99)
Potassium: 4.3 mmol/L (ref 3.5–5.1)
Sodium: 136 mmol/L (ref 135–145)
TOTAL PROTEIN: 6.6 g/dL (ref 6.5–8.1)
Total Bilirubin: 0.4 mg/dL (ref 0.3–1.2)

## 2016-03-28 LAB — INFLUENZA PANEL BY PCR (TYPE A & B)
INFLAPCR: NEGATIVE
Influenza B By PCR: NEGATIVE

## 2016-03-28 LAB — PROTEIN / CREATININE RATIO, URINE
CREATININE, URINE: 56 mg/dL
Protein Creatinine Ratio: 0.13 mg/mg{Cre} (ref 0.00–0.15)
Total Protein, Urine: 7 mg/dL

## 2016-03-28 LAB — CBC
HCT: 33.7 % — ABNORMAL LOW (ref 36.0–46.0)
Hemoglobin: 11.6 g/dL — ABNORMAL LOW (ref 12.0–15.0)
MCH: 30.9 pg (ref 26.0–34.0)
MCHC: 34.4 g/dL (ref 30.0–36.0)
MCV: 89.6 fL (ref 78.0–100.0)
PLATELETS: 439 10*3/uL — AB (ref 150–400)
RBC: 3.76 MIL/uL — ABNORMAL LOW (ref 3.87–5.11)
RDW: 13.3 % (ref 11.5–15.5)
WBC: 15.7 10*3/uL — AB (ref 4.0–10.5)

## 2016-03-28 LAB — RAPID STREP SCREEN (MED CTR MEBANE ONLY): STREPTOCOCCUS, GROUP A SCREEN (DIRECT): NEGATIVE

## 2016-03-28 MED ORDER — METOCLOPRAMIDE HCL 5 MG/ML IJ SOLN
10.0000 mg | Freq: Once | INTRAMUSCULAR | Status: AC
Start: 2016-03-28 — End: 2016-03-28
  Administered 2016-03-28: 10 mg via INTRAVENOUS
  Filled 2016-03-28: qty 2

## 2016-03-28 MED ORDER — FLUTICASONE PROPIONATE 50 MCG/ACT NA SUSP: 2.0000 | Freq: Every day | NASAL | 0 refills | Status: DC

## 2016-03-28 MED ORDER — BENZONATATE 100 MG PO CAPS
200.0000 mg | ORAL_CAPSULE | Freq: Once | ORAL | Status: AC
  Administered 2016-03-28: 200 mg via ORAL
  Filled 2016-03-28: qty 2

## 2016-03-28 MED ORDER — LACTATED RINGERS IV BOLUS (SEPSIS)
1000.0000 mL | Freq: Once | INTRAVENOUS | Status: AC
  Administered 2016-03-28: 1000 mL via INTRAVENOUS

## 2016-03-28 MED ORDER — BENZONATATE 100 MG PO CAPS: 100.0000 mg | ORAL_CAPSULE | Freq: Three times a day (TID) | ORAL | 0 refills | Status: DC

## 2016-03-28 NOTE — Discharge Instructions (Signed)
Safe Medications in Pregnancy   Acne: Benzoyl Peroxide Salicylic Acid  Backache/Headache: Tylenol: 2 regular strength every 4 hours OR              2 Extra strength every 6 hours  Colds/Coughs/Allergies: Benadryl (alcohol free) 25 mg every 6 hours as needed Breath right strips Claritin Cepacol throat lozenges Chloraseptic throat spray Cold-Eeze- up to three times per day Cough drops, alcohol free Flonase (by prescription only) Guaifenesin Mucinex Robitussin DM (plain only, alcohol free) Saline nasal spray/drops Sudafed (pseudoephedrine) & Actifed ** use only after [redacted] weeks gestation and if you do not have high blood pressure Tylenol Vicks Vaporub Zinc lozenges Zyrtec   Constipation: Colace Ducolax suppositories Fleet enema Glycerin suppositories Metamucil Milk of magnesia Miralax Senokot Smooth move tea  Diarrhea: Kaopectate Imodium A-D  *NO pepto Bismol  Hemorrhoids: Anusol Anusol HC Preparation H Tucks  Indigestion: Tums Maalox Mylanta Zantac  Pepcid  Insomnia: Benadryl (alcohol free) 25mg  every 6 hours as needed Tylenol PM Unisom, no Gelcaps  Leg Cramps: Tums MagGel  Nausea/Vomiting:  Bonine Dramamine Emetrol Ginger extract Sea bands Meclizine  Nausea medication to take during pregnancy:  Unisom (doxylamine succinate 25 mg tablets) Take one tablet daily at bedtime. If symptoms are not adequately controlled, the dose can be increased to a maximum recommended dose of two tablets daily (1/2 tablet in the morning, 1/2 tablet mid-afternoon and one at bedtime). Vitamin B6 100mg  tablets. Take one tablet twice a day (up to 200 mg per day).  Skin Rashes: Aveeno products Benadryl cream or 25mg  every 6 hours as needed Calamine Lotion 1% cortisone cream  Yeast infection: Gyne-lotrimin 7 Monistat 7  Gum/tooth pain: Anbesol  **If taking multiple medications, please check labels to avoid duplicating the same active ingredients **take  medication as directed on the label ** Do not exceed 4000 mg of tylenol in 24 hours **Do not take medications that contain aspirin or ibuprofen          Upper Respiratory Infection, Adult Most upper respiratory infections (URIs) are a viral infection of the air passages leading to the lungs. A URI affects the nose, throat, and upper air passages. The most common type of URI is nasopharyngitis and is typically referred to as "the common cold." URIs run their course and usually go away on their own. Most of the time, a URI does not require medical attention, but sometimes a bacterial infection in the upper airways can follow a viral infection. This is called a secondary infection. Sinus and middle ear infections are common types of secondary upper respiratory infections. Bacterial pneumonia can also complicate a URI. A URI can worsen asthma and chronic obstructive pulmonary disease (COPD). Sometimes, these complications can require emergency medical care and may be life threatening. What are the causes? Almost all URIs are caused by viruses. A virus is a type of germ and can spread from one person to another. What increases the risk? You may be at risk for a URI if:  You smoke.  You have chronic heart or lung disease.  You have a weakened defense (immune) system.  You are very young or very old.  You have nasal allergies or asthma.  You work in crowded or poorly ventilated areas.  You work in health care facilities or schools. What are the signs or symptoms? Symptoms typically develop 2-3 days after you come in contact with a cold virus. Most viral URIs last 7-10 days. However, viral URIs from the influenza virus (flu virus)  can last 14-18 days and are typically more severe. Symptoms may include:  Runny or stuffy (congested) nose.  Sneezing.  Cough.  Sore throat.  Headache.  Fatigue.  Fever.  Loss of appetite.  Pain in your forehead, behind your eyes, and over your  cheekbones (sinus pain).  Muscle aches. How is this diagnosed? Your health care provider may diagnose a URI by:  Physical exam.  Tests to check that your symptoms are not due to another condition such as:  Strep throat.  Sinusitis.  Pneumonia.  Asthma. How is this treated? A URI goes away on its own with time. It cannot be cured with medicines, but medicines may be prescribed or recommended to relieve symptoms. Medicines may help:  Reduce your fever.  Reduce your cough.  Relieve nasal congestion. Follow these instructions at home:  Take medicines only as directed by your health care provider.  Gargle warm saltwater or take cough drops to comfort your throat as directed by your health care provider.  Use a warm mist humidifier or inhale steam from a shower to increase air moisture. This may make it easier to breathe.  Drink enough fluid to keep your urine clear or pale yellow.  Eat soups and other clear broths and maintain good nutrition.  Rest as needed.  Return to work when your temperature has returned to normal or as your health care provider advises. You may need to stay home longer to avoid infecting others. You can also use a face mask and careful hand washing to prevent spread of the virus.  Increase the usage of your inhaler if you have asthma.  Do not use any tobacco products, including cigarettes, chewing tobacco, or electronic cigarettes. If you need help quitting, ask your health care provider. How is this prevented? The best way to protect yourself from getting a cold is to practice good hygiene.  Avoid oral or hand contact with people with cold symptoms.  Wash your hands often if contact occurs. There is no clear evidence that vitamin C, vitamin E, echinacea, or exercise reduces the chance of developing a cold. However, it is always recommended to get plenty of rest, exercise, and practice good nutrition. Contact a health care provider if:  You are  getting worse rather than better.  Your symptoms are not controlled by medicine.  You have chills.  You have worsening shortness of breath.  You have brown or red mucus.  You have yellow or brown nasal discharge.  You have pain in your face, especially when you bend forward.  You have a fever.  You have swollen neck glands.  You have pain while swallowing.  You have white areas in the back of your throat. Get help right away if:  You have severe or persistent:  Headache.  Ear pain.  Sinus pain.  Chest pain.  You have chronic lung disease and any of the following:  Wheezing.  Prolonged cough.  Coughing up blood.  A change in your usual mucus.  You have a stiff neck.  You have changes in your:  Vision.  Hearing.  Thinking.  Mood. This information is not intended to replace advice given to you by your health care provider. Make sure you discuss any questions you have with your health care provider. Document Released: 09/10/2000 Document Revised: 11/18/2015 Document Reviewed: 06/22/2013 Elsevier Interactive Patient Education  2017 [employer].

## 2016-03-28 NOTE — MAU Provider Note (Signed)
History     CSN: [id number]  Arrival date and time: 03/28/16 1740   First Provider Initiated Contact with Patient 03/28/16 1825      Chief Complaint  Patient presents with  . Cough  . Headache   HPI Doris Roberson is a 25 y.o. G2P0010 at [redacted]w[redacted]d who presents with cold symptoms and headache. Symptoms began on Sunday. Initially had sore throat which resolved after 1 day. Reports non productive cough, headache, and nausea/vomiting. Nausea/vomiting has been an issue for her throughout the pregnancy. Recently prescribed reglan by ob/gyn (in [place]) but hasn't filled rx yet. Has not vomited today but is nauseated.  Headache off & on x 2 days. Describes as frontal, throbbing headache that she rates 7/10. Has not treated. Denies history of hypertension, vision changes, or epigastric pain.  Denies ear pain, sinus pain, SOB, chest pain, abdominal pain, diarrhea, constipation, vaginal bleeding, or LOF. Positive fetal movement.  Pt states she thinks she has the flu. Husband has been sick with similar symptoms. Pt denies fever/chills or body aches. Did received flu vaccine this season.    OB History    Gravida Para Term Preterm AB Living   2       1     SAB TAB Ectopic Multiple Live Births                  Past Medical History:  Diagnosis Date  . Alcohol abuse    s/p inpatient rehab at [facility] in 2011, two [place]  . Depression    h/o Tylenol pm overdose, subsequent admision to behavioral health  . Mood disorder [facility])     Past Surgical History:  Procedure Laterality Date  . ANTERIOR CRUCIATE LIGAMENT REPAIR Left 2008    Family History  Problem Relation Age of Onset  . Alcohol abuse Other     Social History  Substance Use Topics  . Smoking status: Current Every Day Smoker    Packs/day: 0.25  . Smokeless tobacco: Never Used  . Alcohol use No    Allergies: No Known Allergies  Prescriptions Prior to Admission  Medication Sig Dispense Refill Last Dose  .  Prenatal Vit-Fe Fumarate-FA (PRENATAL MULTIVITAMIN) TABS tablet Take 1 tablet by mouth daily at 12 noon.   03/27/2016 at Unknown time  . promethazine (PHENERGAN) 25 MG tablet Take 25 mg by mouth every 6 (six) hours as needed for nausea or vomiting.   03/27/2016 at Unknown time    Review of Systems  Constitutional: Negative for chills and fever.  HENT: Positive for congestion. Negative for ear pain, sinus pain and sore throat.   Eyes: Negative for blurred vision.  Respiratory: Positive for cough. Negative for sputum production, shortness of breath and wheezing.   Cardiovascular: Negative for chest pain.  Gastrointestinal: Positive for nausea and vomiting. Negative for abdominal pain, constipation and diarrhea.  Genitourinary: Negative.   Musculoskeletal: Negative for myalgias.  Neurological: Positive for headaches. Negative for dizziness.   Physical Exam   Blood pressure 132/80, pulse 91, temperature 98.5 F (36.9 C), resp. rate 16, height 5\' 3"  (1.6 m), weight 161 lb (73 kg), SpO2 99 %.  Temp:  [98.5 F (36.9 C)] 98.5 F (36.9 C) (12/29 1802) Pulse Rate:  [81-95] 81 (12/29 1945) Resp:  [16] 16 (12/29 1802) BP: (124-146)/(73-84) 124/73 (12/29 1943) SpO2:  [99 %] 99 % (12/29 1945) Weight:  [161 lb (73 kg)] 161 lb (73 kg) (12/29 1802)   Physical Exam  Nursing note and vitals  reviewed. Constitutional: She is oriented to person, place, and time. She appears well-developed and well-nourished. No distress.  HENT:  Head: Normocephalic and atraumatic.  Right Ear: Tympanic membrane normal.  Left Ear: Tympanic membrane normal.  Nose: Mucosal edema present. Right sinus exhibits no maxillary sinus tenderness and no frontal sinus tenderness. Left sinus exhibits no maxillary sinus tenderness and no frontal sinus tenderness.  Mouth/Throat: Posterior oropharyngeal erythema present. No oropharyngeal exudate, posterior oropharyngeal edema or tonsillar abscesses.  Eyes: Conjunctivae are normal.  Right eye exhibits no discharge. Left eye exhibits no discharge. No scleral icterus.  Neck: Normal range of motion.  Cardiovascular: Normal rate, regular rhythm and normal heart sounds.   No murmur heard. Respiratory: Effort normal and breath sounds normal. No respiratory distress. She has no wheezes.  GI: Soft. Bowel sounds are normal. There is no tenderness.  Neurological: She is alert and oriented to person, place, and time. She has normal reflexes.  No clonus  Skin: Skin is warm and dry. She is not diaphoretic.  Psychiatric: She has a normal mood and affect. Her behavior is normal. Judgment and thought content normal.   Fetal Tracing:  Baseline: 140 Variability: moderate Accelerations: 15x15 Decelerations: none  Toco: irr ctx   MAU Course  Procedures Results for orders placed or performed during the hospital encounter of 03/28/16 (from the past 24 hour(s))  Urinalysis, Routine w reflex microscopic     Status: Abnormal   Collection Time: 03/28/16  6:00 PM  Result Value Ref Range   Color, Urine YELLOW YELLOW   APPearance CLOUDY (A) CLEAR   Specific Gravity, Urine 1.011 1.005 - 1.030   pH 6.0 5.0 - 8.0   Glucose, UA NEGATIVE NEGATIVE mg/dL   Hgb urine dipstick NEGATIVE NEGATIVE   Bilirubin Urine NEGATIVE NEGATIVE   Ketones, ur NEGATIVE NEGATIVE mg/dL   Protein, ur NEGATIVE NEGATIVE mg/dL   Nitrite NEGATIVE NEGATIVE   Leukocytes, UA NEGATIVE NEGATIVE  Protein / creatinine ratio, urine     Status: None   Collection Time: 03/28/16  6:00 PM  Result Value Ref Range   Creatinine, Urine 56.00 mg/dL   Total Protein, Urine 7 mg/dL   Protein Creatinine Ratio 0.13 0.00 - 0.15 mg/mg[Cre]  CBC     Status: Abnormal   Collection Time: 03/28/16  7:07 PM  Result Value Ref Range   WBC 15.7 (H) 4.0 - 10.5 K/uL   RBC 3.76 (L) 3.87 - 5.11 MIL/uL   Hemoglobin 11.6 (L) 12.0 - 15.0 g/dL   HCT 16.133.7 (L) 09.636.0 - 04.546.0 %   MCV 89.6 78.0 - 100.0 fL   MCH 30.9 26.0 - 34.0 pg   MCHC 34.4 30.0 -  36.0 g/dL   RDW 40.913.3 81.111.5 - 91.415.5 %   Platelets 439 (H) 150 - 400 K/uL  Comprehensive metabolic panel     Status: Abnormal   Collection Time: 03/28/16  7:07 PM  Result Value Ref Range   Sodium 136 135 - 145 mmol/L   Potassium 4.3 3.5 - 5.1 mmol/L   Chloride 106 101 - 111 mmol/L   CO2 24 22 - 32 mmol/L   Glucose, Bld 74 65 - 99 mg/dL   BUN 8 6 - 20 mg/dL   Creatinine, Ser 7.820.40 (L) 0.44 - 1.00 mg/dL   Calcium 9.7 8.9 - 95.610.3 mg/dL   Total Protein 6.6 6.5 - 8.1 g/dL   Albumin 3.3 (L) 3.5 - 5.0 g/dL   AST 19 15 - 41 U/L   ALT 18 14 -  54 U/L   Alkaline Phosphatase 170 (H) 38 - 126 U/L   Total Bilirubin 0.4 0.3 - 1.2 mg/dL   GFR calc non Af Amer >60 >60 mL/min   GFR calc Af Amer >60 >60 mL/min   Anion gap 6 5 - 15  Influenza panel by PCR (type A & B, H1N1)     Status: None   Collection Time: 03/28/16  7:13 PM  Result Value Ref Range   Influenza A By PCR NEGATIVE NEGATIVE   Influenza B By PCR NEGATIVE NEGATIVE    MDM Reactive fetal tracing Initial BP elevated; CBC, CMP, & urine PCR ordered IV fluid bolus, reglan 10 mg IV, & tessalon 200 mg  Pt reports improvement in symptoms Flu & strep swabs collected  Assessment and Plan  A; 1. Acute upper respiratory infection   2. Elevated blood-pressure reading without diagnosis of hypertension    P; Discharge home Rx tessalon & fluticasone Strep swab pending OTC meds safe in pregnancy list given Discussed reasons to return to MAU Keep f/u with OB  Judeth HornErin Ezzie Senat 03/28/2016, 6:24 PM

## 2016-03-28 NOTE — MAU Note (Signed)
Pt presents to MAU with complaints of a cough, vomiting and a headache since Sunday. Denies any vaginal bleeding or abnormal discharge

## 2016-03-31 LAB — CULTURE, GROUP A STREP (THRC)

## 2016-04-02 LAB — OB RESULTS CONSOLE GC/CHLAMYDIA
CHLAMYDIA, DNA PROBE: NEGATIVE
Gonorrhea: NEGATIVE

## 2016-04-08 DIAGNOSIS — L0591 Pilonidal cyst without abscess: Secondary | ICD-10-CM | POA: Insufficient documentation

## 2016-04-10 ENCOUNTER — Inpatient Hospital Stay (HOSPITAL_COMMUNITY)
Admission: AD | Admit: 2016-04-10 | Discharge: 2016-04-10 | Disposition: A | Discharge status: Home / Self Care | Payer: Medicaid Other | Source: Ambulatory Visit | Attending: Obstetrics and Gynecology | Admitting: Obstetrics and Gynecology

## 2016-04-10 ENCOUNTER — Encounter (HOSPITAL_COMMUNITY): Payer: Self-pay | Admitting: *Deleted

## 2016-04-10 DIAGNOSIS — F17210 Nicotine dependence, cigarettes, uncomplicated: Secondary | ICD-10-CM | POA: Diagnosis not present

## 2016-04-10 DIAGNOSIS — O99713 Diseases of the skin and subcutaneous tissue complicating pregnancy, third trimester: Secondary | ICD-10-CM | POA: Insufficient documentation

## 2016-04-10 DIAGNOSIS — K641 Second degree hemorrhoids: Secondary | ICD-10-CM

## 2016-04-10 DIAGNOSIS — O99333 Smoking (tobacco) complicating pregnancy, third trimester: Secondary | ICD-10-CM | POA: Diagnosis not present

## 2016-04-10 DIAGNOSIS — Z3A37 37 weeks gestation of pregnancy: Secondary | ICD-10-CM | POA: Diagnosis not present

## 2016-04-10 DIAGNOSIS — L0501 Pilonidal cyst with abscess: Secondary | ICD-10-CM | POA: Insufficient documentation

## 2016-04-10 DIAGNOSIS — O2243 Hemorrhoids in pregnancy, third trimester: Secondary | ICD-10-CM | POA: Insufficient documentation

## 2016-04-10 DIAGNOSIS — L0591 Pilonidal cyst without abscess: Secondary | ICD-10-CM | POA: Diagnosis present

## 2016-04-10 LAB — URINALYSIS, ROUTINE W REFLEX MICROSCOPIC
Bilirubin Urine: NEGATIVE
Glucose, UA: NEGATIVE mg/dL
HGB URINE DIPSTICK: NEGATIVE
KETONES UR: NEGATIVE mg/dL
LEUKOCYTES UA: NEGATIVE
Nitrite: NEGATIVE
PROTEIN: NEGATIVE mg/dL
Specific Gravity, Urine: 1.015 (ref 1.005–1.030)
pH: 6 (ref 5.0–8.0)

## 2016-04-10 MED ORDER — HYDROCORTISONE ACETATE 25 MG RE SUPP: 25.0000 mg | Freq: Two times a day (BID) | RECTAL | 3 refills | Status: DC

## 2016-04-10 MED ORDER — HYDROCODONE-ACETAMINOPHEN 5-325 MG PO TABS: 1.0000 | ORAL_TABLET | ORAL | 0 refills | Status: DC | PRN

## 2016-04-10 MED ORDER — HYDROCORTISONE ACETATE 25 MG RE SUPP
25.0000 mg | Freq: Once | RECTAL | Status: AC
  Administered 2016-04-10: 25 mg via RECTAL
  Filled 2016-04-10: qty 1

## 2016-04-10 NOTE — MAU Note (Signed)
Pt reports she has a cyst near her sacrum that they did surgery on at the beginning of her pregnancy but it is back and very painful and last pm she started having pain near her rectum and she has a hemorrhoid and today it has gotten larger today and hurts more.

## 2016-04-10 NOTE — MAU Note (Signed)
IN RM  10--   HEMORR-   NOT BLEEDING ,    P CYST   RED  AND  SORE.

## 2016-04-10 NOTE — MAU Note (Signed)
PT  SAYS SHE HAS HAD   CYST   BEFORE   IN 08-2015-   [facility]   [place]    [place] -      ALSO  HA  HEMORR-    SAYS    ALSO  HAS   N/V    ALL  PREG

## 2016-04-10 NOTE — Discharge Instructions (Signed)
Hemorrhoids Hemorrhoids are swollen veins in and around the rectum or anus. There are two types of hemorrhoids:  Internal hemorrhoids. These occur in the veins that are just inside the rectum. They may poke through to the outside and become irritated and painful.  External hemorrhoids. These occur in the veins that are outside of the anus and can be felt as a painful swelling or hard lump near the anus.  Most hemorrhoids do not cause serious problems, and they can be managed with home treatments such as diet and lifestyle changes. If home treatments do not help your symptoms, procedures can be done to shrink or remove the hemorrhoids. What are the causes? This condition is caused by increased pressure in the anal area. This pressure may result from various things, including:  Constipation.  Straining to have a bowel movement.  Diarrhea.  Pregnancy.  Obesity.  Sitting for long periods of time.  Heavy lifting or other activity that causes you to strain.  Anal sex.  What are the signs or symptoms? Symptoms of this condition include:  Pain.  Anal itching or irritation.  Rectal bleeding.  Leakage of stool (feces).  Anal swelling.  One or more lumps around the anus.  How is this diagnosed? This condition can often be diagnosed through a visual exam. Other exams or tests may also be done, such as:  Examination of the rectal area with a gloved hand (digital rectal exam).  Examination of the anal canal using a small tube (anoscope).  A blood test, if you have lost a significant amount of blood.  A test to look inside the colon (sigmoidoscopy or colonoscopy).  How is this treated? This condition can usually be treated at home. However, various procedures may be done if dietary changes, lifestyle changes, and other home treatments do not help your symptoms. These procedures can help make the hemorrhoids smaller or remove them completely. Some of these procedures involve  surgery, and others do not. Common procedures include:  Rubber band ligation. Rubber bands are placed at the base of the hemorrhoids to cut off the blood supply to them.  Sclerotherapy. Medicine is injected into the hemorrhoids to shrink them.  Infrared coagulation. A type of light energy is used to get rid of the hemorrhoids.  Hemorrhoidectomy surgery. The hemorrhoids are surgically removed, and the veins that supply them are tied off.  Stapled hemorrhoidopexy surgery. A circular stapling device is used to remove the hemorrhoids and use staples to cut off the blood supply to them.  Follow these instructions at home: Eating and drinking  Eat foods that have a lot of fiber in them, such as whole grains, beans, nuts, fruits, and vegetables. Ask your health care provider about taking products that have added fiber (fiber supplements).  Drink enough fluid to keep your urine clear or pale yellow. Managing pain and swelling  Take warm sitz baths for 20 minutes, 3-4 times a day to ease pain and discomfort.  If directed, apply ice to the affected area. Using ice packs between sitz baths may be helpful. ? Put ice in a plastic bag. ? Place a towel between your skin and the bag. ? Leave the ice on for 20 minutes, 2-3 times a day. General instructions  Take over-the-counter and prescription medicines only as told by your health care provider.  Use medicated creams or suppositories as told.  Exercise regularly.  Go to the bathroom when you have the urge to have a bowel movement. Do not wait.    bowel movements.  Keep the anal area dry and clean. Use wet toilet paper or moist towelettes after a bowel movement.  Do not sit on the toilet for long periods of time. This increases blood pooling and pain. Contact a health care provider if:  You have increasing pain and swelling that are not controlled by treatment or medicine.  You have uncontrolled bleeding.  You have  difficulty having a bowel movement, or you are unable to have a bowel movement.  You have pain or inflammation outside the area of the hemorrhoids. This information is not intended to replace advice given to you by your health care provider. Make sure you discuss any questions you have with your health care provider. Document Released: 03/14/2000 Document Revised: 08/15/2015 Document Reviewed: 11/29/2014 Elsevier Interactive Patient Education  2017 [employer] Pilonidal Cyst Introduction A pilonidal cyst is a fluid-filled sac. It forms beneath the skin near your tailbone, at the top of the crease of your buttocks. A pilonidal cyst that is not large or infected may not cause symptoms or problems. If the cyst becomes irritated or infected, it may fill with pus. This causes pain and swelling (pilonidal abscess). An infected cyst may need to be treated with medicine, drained, or removed. What are the causes? The cause of a pilonidal cyst is not known. One cause may be a hair that grows into your skin (ingrown hair). What increases the risk? Pilonidal cysts are more common in boys and men. Risk factors include:  Having lots of hair near the crease of the buttocks.  Being overweight.  Having a pilonidal dimple.  Wearing tight clothing.  Not bathing or showering frequently.  Sitting for long periods of time. What are the signs or symptoms? Signs and symptoms of a pilonidal cyst may include:  Redness.  Pain and tenderness.  Warmth.  Swelling.  Pus.  Fever. How is this diagnosed? Your health care provider may diagnose a pilonidal cyst based on your symptoms and a physical exam. The health care provider may do a blood test to check for infection. If your cyst is draining pus, your health care provider may take a sample of the drainage to be tested at a laboratory. How is this treated? Surgery is the usual treatment for an infected pilonidal cyst. You may also have to take  medicines before surgery. The type of surgery you have depends on the size and severity of the infected cyst. The different kinds of surgery include:  Incision and drainage. This is a procedure to open and drain the cyst.  Marsupialization. In this procedure, a large cyst or abscess may be opened and kept open by stitching the edges of the skin to the cyst walls.  Cyst removal. This procedure involves opening the skin and removing all or part of the cyst. Follow these instructions at home:  Follow all of your surgeons instructions carefully if you had surgery.  Take medicines only as directed by your health care provider.  If you were prescribed an antibiotic medicine, finish it all even if you start to feel better.  Keep the area around your pilonidal cyst clean and dry.  Clean the area as directed by your health care provider. Pat the area dry with a clean towel. Do not rub it as this may cause bleeding.  Remove hair from the area around the cyst as directed by your health care provider.  Do not wear tight clothing or sit in one place for long periods of time.  There are many different ways to close and cover an incision, including stitches, skin glue, and adhesive strips. Follow your health care provider's instructions on:  Incision care.  Bandage (dressing) changes and removal.  Incision closure removal. Contact a health care provider if:  You have drainage, redness, swelling, or pain at the site of the cyst.  You have a fever. This information is not intended to replace advice given to you by your health care provider. Make sure you discuss any questions you have with your health care provider. Document Released: 03/14/2000 Document Revised: 08/23/2015 Document Reviewed: 08/04/2013  2017 [employer]

## 2016-04-10 NOTE — MAU Provider Note (Signed)
History     CSN: [id number]  Arrival date and time: 04/10/16 7829   First Provider Initiated Contact with Patient 04/10/16 2025      Chief Complaint  Patient presents with  . Abscess  . Hemorrhoids   Doris Roberson is 26 y.o. G2P0010 at [redacted]w[redacted]d who presents today with a pilondial cyst. She states that she had one at the beginning of the pregnancy and it has returned in the last 5 days. She also has a hemorrhoid x 2 days. She has not tried anything for it. She states that it is gradually worsening. She rates her pain 8/10 at this time. She denies any abdominal pain, contractions, leaking of fluid or vaginal bleeding. She confirms normal fetal movement. She denies any complications with this pregnancy. Nex appointment 04/15/16    Abscess  This is a new problem. Episode onset: about 5 days ago. The problem occurs constantly. The problem has been gradually worsening. Associated symptoms include nausea and vomiting (has had the entire pregnancy ). Pertinent negatives include no chills or fever. Nothing aggravates the symptoms. She has tried heat for the symptoms. The treatment provided no relief.     Past Medical History:  Diagnosis Date  . Alcohol abuse    s/p inpatient rehab at [facility] in 2011, two [place]  . Depression    h/o Tylenol pm overdose, subsequent admision to behavioral health  . Mood disorder [facility])     Past Surgical History:  Procedure Laterality Date  . ANTERIOR CRUCIATE LIGAMENT REPAIR Left 2008    Family History  Problem Relation Age of Onset  . Alcohol abuse Other     Social History  Substance Use Topics  . Smoking status: Current Every Day Smoker    Packs/day: 0.25  . Smokeless tobacco: Never Used  . Alcohol use No    Allergies: No Known Allergies  Prescriptions Prior to Admission  Medication Sig Dispense Refill Last Dose  . benzonatate (TESSALON) 100 MG capsule Take 1 capsule (100 mg total) by mouth every 8 (eight) hours. 21 capsule 0   .  fluticasone (FLONASE) 50 MCG/ACT nasal spray Place 2 sprays into both nostrils daily. 16 g 0   . metoCLOPramide (REGLAN) 10 MG tablet Take by mouth.     . Prenatal Vit-Fe Fumarate-FA (PRENATAL MULTIVITAMIN) TABS tablet Take 1 tablet by mouth daily at 12 noon.   03/27/2016 at Unknown time  . promethazine (PHENERGAN) 25 MG tablet Take 25 mg by mouth every 6 (six) hours as needed for nausea or vomiting.   03/27/2016 at Unknown time    Review of Systems  Constitutional: Negative for chills and fever.  Gastrointestinal: Positive for nausea and vomiting (has had the entire pregnancy ). Negative for constipation and diarrhea.  Genitourinary: Negative for vaginal bleeding and vaginal discharge.   Physical Exam   Blood pressure 125/80, pulse 110, temperature 98.3 F (36.8 C), temperature source Oral, resp. rate 18, height 5\' 6"  (1.676 m), weight 163 lb (73.9 kg), SpO2 98 %.  Physical Exam  Nursing note and vitals reviewed. Constitutional: She is oriented to person, place, and time. She appears well-developed and well-nourished. No distress.  HENT:  Head: Normocephalic.  Cardiovascular: Normal rate.   Respiratory: Effort normal.  GI: Soft. There is no tenderness. There is no rebound.  Genitourinary:  Genitourinary Comments: 2cmx2cm pilonidal abscess. Fluctuant at the very top, but still firm along the edges.  1cmx1cm externalized hemorrhoid.  Hemorrhoid replaced back into rectum and Anusol suppository inserted.  Neurological: She is alert and oriented to person, place, and time.  Skin: Skin is warm and dry.  Psychiatric: She has a normal mood and affect.   FHT: 135, moderate with 15x15 accels, no decels Toco: no UCs  MAU Course  Procedures  MDM   Assessment and Plan   1. Pilonidal abscess   2. Grade II hemorrhoids   3. [redacted] weeks gestation of pregnancy     DC home Comfort measures reviewed  3rd Trimester precautions  PTL precautions  Fetal kick counts RX: Anusol suppositories  BID, Vicodin PRN #15  Return to MAU as needed FU with OB as planned  Follow-up Information    [facility] Follow up.   Contact information: Triage RN for urgent/walk in clinic  [phone]           Tawnya CrookHogan, Tramaine Sauls Donovan 04/10/2016, 8:27 PM

## 2016-04-14 ENCOUNTER — Encounter: Payer: Self-pay | Admitting: *Deleted

## 2016-04-14 DIAGNOSIS — R8271 Bacteriuria: Secondary | ICD-10-CM

## 2016-04-14 HISTORY — DX: Bacteriuria: R82.71

## 2016-04-15 ENCOUNTER — Encounter: Payer: Self-pay | Admitting: Surgery

## 2016-04-15 ENCOUNTER — Ambulatory Visit (INDEPENDENT_AMBULATORY_CARE_PROVIDER_SITE_OTHER): Payer: Medicaid Other | Admitting: Surgery

## 2016-04-15 VITALS — BP 120/74 | HR 92 | Temp 97.6°F | Ht 66.0 in | Wt 163.8 lb

## 2016-04-15 DIAGNOSIS — K645 Perianal venous thrombosis: Secondary | ICD-10-CM

## 2016-04-15 DIAGNOSIS — L0501 Pilonidal cyst with abscess: Secondary | ICD-10-CM

## 2016-04-15 MED ORDER — CEPHALEXIN 500 MG PO CAPS: 500.0000 mg | ORAL_CAPSULE | Freq: Four times a day (QID) | ORAL | 0 refills | Status: DC

## 2016-04-15 NOTE — Progress Notes (Signed)
  Surgical Consultation  04/15/2016  Doris Roberson is an 26 y.o. female.   CC: Pilonidal cyst  HPI: This patient with a pilonidal cyst she is [redacted] weeks pregnant and was referred over by her obstetrician. The patient had a pilonidal cyst at the commencement of her pregnancy which was I indeed at an emergency room. Then it has recurred and last night it spontaneously drained. She is feeling much better and much less pain no fevers or chills. She is not currently on antibiotics.  Also she had an acute hemorrhoid thrombosis last week but did not have any I&D of that either. Her pain from her hemorrhoid is much better.   Past Medical History:  Diagnosis Date  . Alcohol abuse    s/[place] inpatient re[place] at [facility] [place], [place]  . Depression    h/o Tylenol pm overdose, subsequent admision to behavioral health  . Mood disorder [facility](HCC)     Past Surgical History:  Procedure Laterality Date  . ANTERIOR CRUCIATE LIGAMENT REPAIR Left 2008    Family History  Problem Relation Age of Onset  . Alcohol abuse Other     Social History:  reports that she has been smoking.  She has been smoking about 0.25 packs per day. She has never used smokeless tobacco. She reports that she does not drink alcohol or use drugs.  Allergies:  Allergies  Allergen Reactions  . Other Other (See Comments)    Medications reviewed.   Review of Systems:   Review of Systems  Constitutional: Negative for chills and fever.  HENT: Negative.   Eyes: Negative.   Respiratory: Negative.   Cardiovascular: Negative.   Gastrointestinal: Negative.   Genitourinary: Negative.   Musculoskeletal: Negative.   Skin: Negative.   Neurological: Negative.   Endo/Heme/Allergies: Negative.   Psychiatric/Behavioral: Negative.      Physical Exam:  There were no vitals taken for this visit.  Physical Exam  Constitutional: She is oriented to person, place, and time and well-developed, well-nourished, and in no  distress. No distress.  Obviously pregnant  HENT:  Head: Normocephalic and atraumatic.  Abdominal:  Gravid  Genitourinary:  Genitourinary Comments: External rectal exam demonstrates a resolving thrombosed hemorrhoid with eschar  Musculoskeletal: Normal range of motion. She exhibits no edema.  Neurological: She is alert and oriented to person, place, and time.  Skin: Skin is warm. She is not diaphoretic. There is erythema.  Spontaneous drainage from a erythematous fluctuant area with no expressible purulence at this time.  Vitals reviewed.     No results found for this or any previous visit (from the past 48 hour(s)). No results found.  Assessment/Plan:  #1 resolving thrombosed hemorrhoid no further treatment needed. She has Anusol at home.  #2 infected pilonidal cyst with abscess which has spontaneously drained. I've recommended starting her on Keflex and asked her to notify her OB that I'm starting her on Keflex. There is no expressible purulence at this time and no need for further drainage. I did discuss with her the need for excision at some point once she is postpartum.  Patient will follow-up next week.   Lattie Hawichard E Marrion Accomando, MD, FACS

## 2016-04-15 NOTE — Patient Instructions (Signed)
Please pick up your medicine at the pharmacy. Please see your follow up appointment listed below. Please call our office if you have questions or concerns.

## 2016-04-15 NOTE — Addendum Note (Signed)
Addended by: Cameron ProudHILDERS, Keven Soucy S on: 04/15/2016 10:29 AM   Modules accepted: Orders

## 2016-04-21 ENCOUNTER — Ambulatory Visit: Payer: Medicaid Other | Admitting: Surgery

## 2016-04-24 ENCOUNTER — Ambulatory Visit: Payer: Self-pay | Admitting: General Surgery

## 2016-05-05 ENCOUNTER — Other Ambulatory Visit: Payer: Self-pay | Admitting: Obstetrics and Gynecology

## 2016-05-05 ENCOUNTER — Inpatient Hospital Stay
Admission: RE | Admit: 2016-05-05 | Discharge: 2016-05-09 | DRG: 766 | Disposition: A | Discharge status: Home / Self Care | Payer: Medicaid Other | Source: Ambulatory Visit | Attending: Obstetrics and Gynecology | Admitting: Obstetrics and Gynecology

## 2016-05-05 DIAGNOSIS — O99214 Obesity complicating childbirth: Secondary | ICD-10-CM | POA: Diagnosis present

## 2016-05-05 DIAGNOSIS — O26893 Other specified pregnancy related conditions, third trimester: Secondary | ICD-10-CM | POA: Diagnosis present

## 2016-05-05 DIAGNOSIS — O9962 Diseases of the digestive system complicating childbirth: Secondary | ICD-10-CM | POA: Diagnosis present

## 2016-05-05 DIAGNOSIS — O99334 Smoking (tobacco) complicating childbirth: Secondary | ICD-10-CM | POA: Diagnosis present

## 2016-05-05 DIAGNOSIS — K219 Gastro-esophageal reflux disease without esophagitis: Secondary | ICD-10-CM | POA: Diagnosis present

## 2016-05-05 DIAGNOSIS — Z6791 Unspecified blood type, Rh negative: Secondary | ICD-10-CM

## 2016-05-05 DIAGNOSIS — Z3A40 40 weeks gestation of pregnancy: Secondary | ICD-10-CM

## 2016-05-05 DIAGNOSIS — O480 Post-term pregnancy: Principal | ICD-10-CM | POA: Diagnosis present

## 2016-05-05 DIAGNOSIS — F17210 Nicotine dependence, cigarettes, uncomplicated: Secondary | ICD-10-CM | POA: Diagnosis present

## 2016-05-05 DIAGNOSIS — O9081 Anemia of the puerperium: Secondary | ICD-10-CM | POA: Diagnosis not present

## 2016-05-05 DIAGNOSIS — E669 Obesity, unspecified: Secondary | ICD-10-CM | POA: Diagnosis present

## 2016-05-05 DIAGNOSIS — Z6827 Body mass index (BMI) 27.0-27.9, adult: Secondary | ICD-10-CM | POA: Diagnosis not present

## 2016-05-05 LAB — CBC
HEMATOCRIT: 35 % (ref 35.0–47.0)
HEMOGLOBIN: 12.4 g/dL (ref 12.0–16.0)
MCH: 31.9 pg (ref 26.0–34.0)
MCHC: 35.3 g/dL (ref 32.0–36.0)
MCV: 90.2 fL (ref 80.0–100.0)
Platelets: 406 10*3/uL (ref 150–440)
RBC: 3.88 MIL/uL (ref 3.80–5.20)
RDW: 13.7 % (ref 11.5–14.5)
WBC: 16.4 10*3/uL — ABNORMAL HIGH (ref 3.6–11.0)

## 2016-05-05 LAB — TYPE AND SCREEN
ABO/RH(D): O NEG
ANTIBODY SCREEN: NEGATIVE

## 2016-05-05 MED ORDER — OXYTOCIN 40 UNITS IN LACTATED RINGERS INFUSION - SIMPLE MED
2.5000 [IU]/h | INTRAVENOUS | Status: DC
  Filled 2016-05-05: qty 1000

## 2016-05-05 MED ORDER — TERBUTALINE SULFATE 1 MG/ML IJ SOLN: 0.2500 mg | Freq: Once | INTRAMUSCULAR | Status: DC | PRN

## 2016-05-05 MED ORDER — BUTORPHANOL TARTRATE 1 MG/ML IJ SOLN
1.0000 mg | INTRAMUSCULAR | Status: DC | PRN
  Administered 2016-05-06 (×2): 1 mg via INTRAVENOUS
  Filled 2016-05-05 (×2): qty 1

## 2016-05-05 MED ORDER — ACETAMINOPHEN 325 MG PO TABS: 650.0000 mg | ORAL_TABLET | ORAL | Status: DC | PRN

## 2016-05-05 MED ORDER — LIDOCAINE HCL (PF) 1 % IJ SOLN: 30.0000 mL | INTRAMUSCULAR | Status: DC | PRN

## 2016-05-05 MED ORDER — LACTATED RINGERS IV SOLN: 500.0000 mL | INTRAVENOUS | Status: DC | PRN

## 2016-05-05 MED ORDER — DINOPROSTONE 10 MG VA INST
10.0000 mg | VAGINAL_INSERT | Freq: Once | VAGINAL | Status: AC
  Administered 2016-05-05: 10 mg via VAGINAL
  Filled 2016-05-05: qty 1

## 2016-05-05 MED ORDER — ONDANSETRON HCL 4 MG/2ML IJ SOLN
4.0000 mg | Freq: Four times a day (QID) | INTRAMUSCULAR | Status: DC | PRN
  Administered 2016-05-06 – 2016-05-07 (×3): 4 mg via INTRAVENOUS
  Filled 2016-05-05 (×2): qty 2

## 2016-05-05 MED ORDER — ZOLPIDEM TARTRATE 5 MG PO TABS
5.0000 mg | ORAL_TABLET | Freq: Every evening | ORAL | Status: DC | PRN
  Administered 2016-05-05: 5 mg via ORAL
  Filled 2016-05-05: qty 1

## 2016-05-05 MED ORDER — OXYTOCIN BOLUS FROM INFUSION: 500.0000 mL | Freq: Once | INTRAVENOUS | Status: DC

## 2016-05-05 MED ORDER — LACTATED RINGERS IV SOLN
INTRAVENOUS | Status: DC
  Administered 2016-05-05 – 2016-05-07 (×5): via INTRAVENOUS

## 2016-05-05 MED ORDER — SOD CITRATE-CITRIC ACID 500-334 MG/5ML PO SOLN
30.0000 mL | ORAL | Status: DC | PRN
  Administered 2016-05-07: 30 mL via ORAL

## 2016-05-05 NOTE — Progress Notes (Signed)
Doris Roberson is a [age] y.o. G2P0010 at 1365w5d by US  admitted for IOL due to post-dates. PNC at [facility] significant for tobacco abuse, small subchorionic hemorrhage4, facial edema at 21 weeks with no diagnosis, epistaxsis, pilonidal cyst presents here this pm for IOL. Disc risks and benefits and alternatives including infection, bleeding, fetal or uterine intolerance and pt accepts the risks.   Subjective: I am ready to be induced  Objective: BP 135/82 (BP Location: Left Arm)   Pulse 100   Temp 98.4 F (36.9 C) (Oral)   Resp 16   Ht 5\' 6"  (1.676 m)   Wt 76.7 kg (169 lb)   BMI 27.28 kg/m  No intake/output data recorded. No intake/output data recorded.  FHT: 145, +accels, no decels, CAt 1 strip UC:   occas SVE:    FT/long  Labs: Lab Results  Component Value Date   WBC 16.4 (H) 05/05/2016   HGB 12.4 05/05/2016   HCT 35.0 05/05/2016   MCV 90.2 05/05/2016   PLT 406 05/05/2016    Assessment / Plan: A: IUP at 40 5/7 weeks for IOL due to post-dates 2. Tobacco Abuse 3. Obesity  P: [facility] to [facility] for [facility]. 2. Ext fetal and uterine monitors 3. GBS neg 4. Cervidil 10 mg per vagina .    Sharee PimpleCaron W Bernadene Garside 05/05/2016, 10:51 PM

## 2016-05-05 NOTE — H&P (Signed)
OB ADMISSION/ HISTORY & PHYSICAL:  Admission Date: No admission date for patient encounter.  Admit Diagnosis: IOL at 40+5 weeks for postdates   Doris Roberson is a 26 y.o. female presenting for induction of labor for postdates.    Prenatal History: G2P0010   EDC : 04/30/2016, by Ultrasound  Prenatal care at [facility]  Prenatal course complicated by: Tobacco Use in Pregnancy 0.5PPD, RH Negative s/p Rhogam, Small Subchorionic Hemorrhage noted on [place] on, 7/7/17Facial swelling at 21 wks without other s/s PreE, Epistaxsis, Pilonidal cyst:  Prenatal Labs: ABO, Rh:  O Negative Antibody:  Negative Rubella:   Immune Varicella: Immune  RPR:   NR HBsAg:   Negative HIV:   Negative GTT: 81 GBS:   Negative  1st trimester genetic screening: Negative AFP: Negative Flu: given 01/03/16 Tdap: given 02/15/16  Medical / Surgical History :  Past medical history:  Past Medical History:  Diagnosis Date  . Alcohol abuse    s/p inpatient rehab at [facility] in 2011, two [place]  . Depression    h/o Tylenol pm overdose, subsequent admision to behavioral health  . Mood disorder [facility])      Past surgical history:  Past Surgical History:  Procedure Laterality Date  . ANTERIOR CRUCIATE LIGAMENT REPAIR Left 2008    Family History:  Family History  Problem Relation Age of Onset  . Alcohol abuse Other   . Heart disease Father      Social History:  reports that she has been smoking.  She has been smoking about 0.25 packs per day. She has never used smokeless tobacco. She reports that she does not drink alcohol or use drugs.   Allergies: Other    Current Medications at time of admission:  Prior to Admission medications   Medication Sig Start Date End Date Taking? Authorizing Provider  cephALEXin (KEFLEX) 500 MG capsule Take 1 capsule (500 mg total) by mouth 4 (four) times daily. 04/15/16   Lattie Haw, MD  fluticasone (FLONASE) 50 MCG/ACT nasal spray Place 2 sprays into  both nostrils daily. 03/28/16   Judeth Horn, NP  HYDROcodone-acetaminophen (NORCO/VICODIN) 5-325 MG tablet Take 1-2 tablets by mouth every 4 (four) hours as needed. 04/10/16   Armando Reichert, CNM  hydrocortisone (ANUSOL-HC) 25 MG suppository Place 1 suppository (25 mg total) rectally 2 (two) times daily. 04/10/16   Armando Reichert, CNM  metoCLOPramide (REGLAN) 10 MG tablet Take by mouth. 03/15/16   Historical Provider, MD  Prenatal Vit-Fe Fumarate-FA (PRENATAL MULTIVITAMIN) TABS tablet Take 1 tablet by mouth daily at 12 noon.    Historical Provider, MD  promethazine (PHENERGAN) 25 MG tablet Take 25 mg by mouth every 6 (six) hours as needed for nausea or vomiting.    Historical Provider, MD     Review of Systems: Active FM +Bh ctxs No LOF  / SROM  No bloody show    Physical Exam:  VS: There were no vitals taken for this visit.  General: alert and oriented, appears  calm Heart: RRR Lungs: Clear lung fields Abdomen: Gravid, soft and non-tender, non-distended / uterus: gravid, non-tender Extremities: no edema  Genitalia / VE:  on 05/02/16: external os: 1cm/ internal os: FTP/40%/soft/-3/vtx  Reactive NST on 05/02/16  Assessment: 40+[redacted] weeks gestation Induction stage of labor GBS Negative Tobacco Use  Plan:  1. Admit to [employer] for Induction of Labor for postdates    - Routine labor and delivery orders    - Cervidil 10mg  vaginally x 1    -  Ambien 5mg  PRN for sleep tonight    - Pain control: Stadol 1mg  IVP PRN for pain or epidural at >3cm 2. GBS Negative 3. Postpartum:     - Breast/Formula    - Contraception: unsure  4. Anticipate SVD  Dr. Dalbert GarnetBeasley notified of admission / plan of care  Carlean JewsMeredith Hermilo Dutter, CNM

## 2016-05-06 ENCOUNTER — Inpatient Hospital Stay: Payer: Medicaid Other | Admitting: Anesthesiology

## 2016-05-06 MED ORDER — NALOXONE HCL 0.4 MG/ML IJ SOLN: 0.4000 mg | INTRAMUSCULAR | Status: DC | PRN

## 2016-05-06 MED ORDER — MEPERIDINE HCL 25 MG/ML IJ SOLN: 6.2500 mg | INTRAMUSCULAR | Status: DC | PRN

## 2016-05-06 MED ORDER — SODIUM CHLORIDE 0.9 % IV SOLN: 10.0000 ug/h | INTRAVENOUS | Status: DC

## 2016-05-06 MED ORDER — KETOROLAC TROMETHAMINE 30 MG/ML IJ SOLN: 30.0000 mg | Freq: Four times a day (QID) | INTRAMUSCULAR | Status: DC | PRN

## 2016-05-06 MED ORDER — LIDOCAINE HCL (PF) 1 % IJ SOLN
INTRAMUSCULAR | Status: AC
  Filled 2016-05-06: qty 30

## 2016-05-06 MED ORDER — OXYTOCIN 40 UNITS IN LACTATED RINGERS INFUSION - SIMPLE MED
1.0000 m[IU]/min | INTRAVENOUS | Status: DC
  Administered 2016-05-06 – 2016-05-07 (×2): 2 m[IU]/min via INTRAVENOUS

## 2016-05-06 MED ORDER — NALOXONE HCL 2 MG/2ML IJ SOSY
1.0000 ug/kg/h | PREFILLED_SYRINGE | INTRAVENOUS | Status: DC | PRN
  Filled 2016-05-06: qty 2

## 2016-05-06 MED ORDER — OXYTOCIN 10 UNIT/ML IJ SOLN
INTRAMUSCULAR | Status: AC
  Filled 2016-05-06: qty 2

## 2016-05-06 MED ORDER — NALBUPHINE HCL 10 MG/ML IJ SOLN: 5.0000 mg | Freq: Once | INTRAMUSCULAR | Status: DC | PRN

## 2016-05-06 MED ORDER — NALBUPHINE HCL 10 MG/ML IJ SOLN: 5.0000 mg | INTRAMUSCULAR | Status: DC | PRN

## 2016-05-06 MED ORDER — ROPIVACAINE HCL 2 MG/ML IJ SOLN
10.0000 mL/h | INTRAMUSCULAR | Status: DC
  Filled 2016-05-06 (×4): qty 5

## 2016-05-06 MED ORDER — TERBUTALINE SULFATE 1 MG/ML IJ SOLN: 0.2500 mg | Freq: Once | INTRAMUSCULAR | Status: DC | PRN

## 2016-05-06 MED ORDER — FENTANYL 2.5 MCG/ML W/ROPIVACAINE 0.2% IN NS 100 ML EPIDURAL INFUSION (ARMC-ANES)
10.0000 mL/h | EPIDURAL | Status: DC
  Administered 2016-05-06: 10 mL/h via EPIDURAL
  Administered 2016-05-07: 20 mL/h via EPIDURAL
  Filled 2016-05-06: qty 100

## 2016-05-06 MED ORDER — MISOPROSTOL 200 MCG PO TABS
ORAL_TABLET | ORAL | Status: AC
  Filled 2016-05-06: qty 4

## 2016-05-06 MED ORDER — LIDOCAINE HCL (PF) 1 % IJ SOLN
INTRAMUSCULAR | Status: DC | PRN
  Administered 2016-05-06: 3 mL via SUBCUTANEOUS

## 2016-05-06 MED ORDER — ONDANSETRON HCL 4 MG/2ML IJ SOLN
4.0000 mg | Freq: Three times a day (TID) | INTRAMUSCULAR | Status: DC | PRN
  Filled 2016-05-06: qty 2

## 2016-05-06 MED ORDER — SODIUM CHLORIDE 0.9% FLUSH: 3.0000 mL | INTRAVENOUS | Status: DC | PRN

## 2016-05-06 MED ORDER — DIPHENHYDRAMINE HCL 25 MG PO CAPS: 25.0000 mg | ORAL_CAPSULE | ORAL | Status: DC | PRN

## 2016-05-06 MED ORDER — DIPHENHYDRAMINE HCL 50 MG/ML IJ SOLN
12.5000 mg | INTRAMUSCULAR | Status: DC | PRN
  Administered 2016-05-07: 25 mg via INTRAVENOUS
  Filled 2016-05-06: qty 1

## 2016-05-06 MED ORDER — BUPIVACAINE HCL (PF) 0.25 % IJ SOLN
INTRAMUSCULAR | Status: DC | PRN
  Administered 2016-05-06 (×2): 5 mL via EPIDURAL

## 2016-05-06 MED ORDER — AMMONIA AROMATIC IN INHA
RESPIRATORY_TRACT | Status: AC
  Filled 2016-05-06: qty 10

## 2016-05-06 MED ORDER — LIDOCAINE-EPINEPHRINE (PF) 1.5 %-1:200000 IJ SOLN
INTRAMUSCULAR | Status: DC | PRN
  Administered 2016-05-06: 3 mL via EPIDURAL

## 2016-05-06 NOTE — Progress Notes (Addendum)
S:  Feeling stronger contractions and using Nitrous oxide PRN      Discussed foley bulb placement and patient agrees   O:  VS: Blood pressure 129/71, pulse 81, temperature 97.9 F (36.6 C), temperature source Oral, resp. rate 20, height 5\' 6"  (1.676 m), weight 76.7 kg (169 lb).        FHR : baseline 125 bpm / variability moderate / accelerations + / occasional variable decelerations        Toco: contractions every 2-4 minutes / mild-moderate         Cervix : Dilation: 2 Effacement (%): 90 Cervical Position: Anterior Station: -2 Presentation: Vertex Exam by:: J. Grindheim        Membranes: Intact  A: Latent labor     FHR category 2  P: Foley Bulb placed and instilled with 30mL of sterile water with minimal discomfort      Continue nitrous oxide or Stadol PRN     Reassess in 1-2 hours    Carlean JewsMeredith Cheryel Kyte, CNM

## 2016-05-06 NOTE — Progress Notes (Addendum)
S:  Pt. Is tearful and nauseous requiring Zofran IVP.  She states she is exhausted.  Multiple family members present in the room     Foley bulb out around 1700    She is comfortable with her epidural   O:  VS: Blood pressure 125/81, pulse (!) 107, temperature 97.9 F (36.6 C), temperature source Oral, resp. rate 20, height 5\' 6"  (1.676 m), weight 76.7 kg (169 lb), SpO2 100 %.        FHR : baseline 120bpm / variability moderate / accelerations + / occasional variable decelerations        Toco: contractions every 2-6 minutes /moderate         Cervix : Dilation: 5 Effacement (%): 80, 90 Cervical Position: Anterior Station: Ballotable Presentation: Vertex Exam by:: M Sigmon CNM        Membranes: Intract   A: Latent labor     FHR category 2  P: Begin Pitocin at 2 milliunits and increase by 2 milliunits      AROM when appropriate     Advised family members to let Adyn rest for several hours      Anticipate NSVD      Carlean JewsMeredith Sigmon, CNM

## 2016-05-06 NOTE — Progress Notes (Signed)
S:  Comfortable with epidural and nausea has improved     C/o heartburn, but declines medication       Desires to be checked - currently on Pitocin 6 milliunits  O:  VS: Blood pressure 125/77, pulse 84, temperature 98.1 F (36.7 C), temperature source Oral, resp. rate 18, height 5\' 6"  (1.676 m), weight 76.7 kg (169 lb), SpO2 100 %.        FHR : baseline 125 bpm / variability moderate / accelerations + / none decelerations        Toco: contractions every 2-4 minutes / moderate        Cervix : Dilation: 5.5 Effacement (%): 80, 90 Cervical Position: Middle Station: -2 Presentation: Vertex Exam by:: Carlean JewsMeredith Daltyn Degroat, CNM         Membranes: Intact  A: Latent labor     FHR category 1  P: Continue Pitocin augmentation      AROM when appropriate     Reassess in 1-2 hours    Anticipate NSVD   Carlean JewsMeredith Santiana Glidden, CNM

## 2016-05-06 NOTE — Anesthesia Procedure Notes (Signed)
Epidural Patient location during procedure: OB Start time: 05/06/2016 5:45 PM End time: 05/06/2016 5:55 PM  Staffing Anesthesiologist: Lenard SimmerKARENZ, Bristal Steffy Performed: anesthesiologist   Preanesthetic Checklist Completed: patient identified, site marked, surgical consent, pre-op evaluation, timeout performed, IV checked, risks and benefits discussed and monitors and equipment checked  Epidural Patient position: sitting Prep: ChloraPrep Patient monitoring: heart rate, continuous pulse ox and blood pressure Approach: midline Location: L4-L5 Injection technique: LOR saline  Needle:  Needle type: Tuohy  Needle gauge: 17 G Needle length: 9 cm and 9 Needle insertion depth: 3.5 cm Catheter type: closed end flexible Catheter size: 19 Gauge Catheter at skin depth: 8 cm Test dose: negative and 1.5% lidocaine with Epi 1:200 K  Assessment Sensory level: T10 Events: blood not aspirated, injection not painful, no injection resistance, negative IV test and no paresthesia  Additional Notes Pt. Evaluated and documentation done after procedure finished. Patient identified. Risks/Benefits/Options discussed with patient including but not limited to bleeding, infection, nerve damage, paralysis, failed block, incomplete pain control, headache, blood pressure changes, nausea, vomiting, reactions to medication both or allergic, itching and postpartum back pain. Confirmed with bedside nurse the patient's most recent platelet count. Confirmed with patient that they are not currently taking any anticoagulation, have any bleeding history or any family history of bleeding disorders. Patient expressed understanding and wished to proceed. All questions were answered. Sterile technique was used throughout the entire procedure. Please see nursing notes for vital signs. Test dose was given through epidural catheter and negative prior to continuing to dose epidural or start infusion. Warning signs of high block given to the  patient including shortness of breath, tingling/numbness in hands, complete motor block, or any concerning symptoms with instructions to call for help. Patient was given instructions on fall risk and not to get out of bed. All questions and concerns addressed with instructions to call with any issues or inadequate analgesia.   Patient tolerated the insertion well without immediate complications.Reason for block:procedure for pain

## 2016-05-06 NOTE — Anesthesia Preprocedure Evaluation (Addendum)
Anesthesia Evaluation  Patient identified by MRN, date of birth, ID band Patient awake    Reviewed: Allergy & Precautions, H&P , NPO status , Patient's Chart, lab work & pertinent test results, reviewed documented beta blocker date and time   History of Anesthesia Complications Negative for: history of anesthetic complications  Airway Mallampati: III  TM Distance: >3 FB Neck ROM: full    Dental  (+) Teeth Intact   Pulmonary neg shortness of breath, neg sleep apnea, neg COPD, neg recent URI, Current Smoker,           Cardiovascular Exercise Tolerance: Good negative cardio ROS       Neuro/Psych PSYCHIATRIC DISORDERS (Depression, h/o alcohol abuse) negative neurological ROS     GI/Hepatic Neg liver ROS, GERD  ,  Endo/Other  negative endocrine ROS  Renal/GU negative Renal ROS  negative genitourinary   Musculoskeletal   Abdominal   Peds  Hematology negative hematology ROS (+)   Anesthesia Other Findings Past Medical History: No date: Alcohol abuse     Comment: s/p inpatient rehab at [facility] in               2011, two DUI's No date: Depression     Comment: h/o Tylenol pm overdose, subsequent admision               to behavioral health No date: Mood disorder (HCC)   Reproductive/Obstetrics (+) Pregnancy                             Anesthesia Physical Anesthesia Plan  ASA: II  Anesthesia Plan: Epidural   Post-op Pain Management:    Induction:   Airway Management Planned:   Additional Equipment:   Intra-op Plan:   Post-operative Plan:   Informed Consent: I have reviewed the patients History and Physical, chart, labs and discussed the procedure including the risks, benefits and alternatives for the proposed anesthesia with the patient or authorized representative who has indicated his/her understanding and acceptance.   Dental Advisory Given  Plan Discussed with:  Anesthesiologist, CRNA and Surgeon  Anesthesia Plan Comments:         Anesthesia Quick Evaluation

## 2016-05-06 NOTE — Progress Notes (Signed)
S:  Pt. Has showered and had a light laboring diet and is feeling better      Hurting more with her contractions rating them 6/10 per RN   O:  VS: Blood pressure 129/71, pulse 81, temperature 97.9 F (36.6 C), temperature source Oral, resp. rate 20, height 5\' 6"  (1.676 m), weight 76.7 kg (169 lb).        FHR : baseline 135 bpm / variability moderate / accelerations + / occasional variable decelerations        Toco: contractions every 1-3 minutes / mild-moderate         Cervix : Dilation: 1.5 Effacement (%): 60 Station: -3 Presentation: Vertex Exam by:: Sharyl NimrodMeredith Zoiee Wimmer        Membranes: Intact  A: Latent labor     FHR category 2  P: Continue expectant management for now since she is contracting well on her own      Will reassess in 1-2 hours, if no cervical change will plan for foley bulb and Pitocin, AROM when appropriate  Carlean JewsMeredith Maydelin Deming, CNM

## 2016-05-06 NOTE — Progress Notes (Signed)
I am assuming care with Dr. Dalbert GarnetBeasley as my supervising physician  S:  Pt. Feeling contractions, but not requiring pain medication       Cervidil removed at 9am        Pt. States she is starving and would like to eat  O:  VS: Blood pressure 119/65, pulse 75, temperature 97.8 F (36.6 C), temperature source Oral, resp. rate 20, height 5\' 6"  (1.676 m), weight 76.7 kg (169 lb).        FHR : baseline 125 bpm/ variability moderate / accelerations + / occasional variable decelerations        Toco: contractions every 3-3.5 minutes / mild-moderate         Cervix : Dilation: 1.5 Effacement (%): 60 Station: -3 Presentation: Vertex Exam by:: Sharyl NimrodMeredith Akeem Heppler        Membranes: Intact  A: Induction of labor     FHR category 2     GBS Negative   P: Okay for light laboring diet and shower       Plan for Foley Bulb and Pitocin after       Reassess in 1-2 hours  Carlean JewsMeredith Malachy Coleman, CNM

## 2016-05-07 ENCOUNTER — Encounter
Admission: RE | Disposition: A | Discharge status: Home / Self Care | Payer: Self-pay | Source: Ambulatory Visit | Attending: Obstetrics and Gynecology

## 2016-05-07 LAB — RPR: RPR Ser Ql: NONREACTIVE

## 2016-05-07 SURGERY — Surgical Case
Anesthesia: Epidural | Site: Abdomen | Wound class: Clean

## 2016-05-07 MED ORDER — NALBUPHINE HCL 10 MG/ML IJ SOLN: 5.0000 mg | Freq: Once | INTRAMUSCULAR | Status: DC | PRN

## 2016-05-07 MED ORDER — NALBUPHINE HCL 10 MG/ML IJ SOLN: 5.0000 mg | INTRAMUSCULAR | Status: DC | PRN

## 2016-05-07 MED ORDER — TETANUS-DIPHTH-ACELL PERTUSSIS 5-2.5-18.5 LF-MCG/0.5 IM SUSP: 0.5000 mL | Freq: Once | INTRAMUSCULAR | Status: DC

## 2016-05-07 MED ORDER — BUPIVACAINE HCL (PF) 0.5 % IJ SOLN
INTRAMUSCULAR | Status: DC | PRN
  Administered 2016-05-07: 30 mL

## 2016-05-07 MED ORDER — DIPHENHYDRAMINE HCL 50 MG/ML IJ SOLN: 12.5000 mg | INTRAMUSCULAR | Status: DC | PRN

## 2016-05-07 MED ORDER — MENTHOL 3 MG MT LOZG
1.0000 | LOZENGE | OROMUCOSAL | Status: DC | PRN
  Filled 2016-05-07: qty 9

## 2016-05-07 MED ORDER — IBUPROFEN 600 MG PO TABS
600.0000 mg | ORAL_TABLET | Freq: Four times a day (QID) | ORAL | Status: DC
  Administered 2016-05-07 – 2016-05-09 (×9): 600 mg via ORAL
  Filled 2016-05-07 (×9): qty 1

## 2016-05-07 MED ORDER — SIMETHICONE 80 MG PO CHEW
80.0000 mg | CHEWABLE_TABLET | ORAL | Status: DC
  Administered 2016-05-08: 80 mg via ORAL

## 2016-05-07 MED ORDER — PHENYLEPHRINE HCL 10 MG/ML IJ SOLN
INTRAMUSCULAR | Status: AC
  Filled 2016-05-07: qty 1

## 2016-05-07 MED ORDER — ACETAMINOPHEN 325 MG PO TABS: 650.0000 mg | ORAL_TABLET | ORAL | Status: DC | PRN

## 2016-05-07 MED ORDER — BUPIVACAINE HCL (PF) 0.5 % IJ SOLN
INTRAMUSCULAR | Status: AC
  Filled 2016-05-07: qty 30

## 2016-05-07 MED ORDER — PHENYLEPHRINE HCL 10 MG/ML IJ SOLN
INTRAMUSCULAR | Status: DC | PRN
  Administered 2016-05-07: 100 ug via INTRAVENOUS
  Administered 2016-05-07 (×2): 200 ug via INTRAVENOUS
  Administered 2016-05-07 (×6): 100 ug via INTRAVENOUS

## 2016-05-07 MED ORDER — COCONUT OIL OIL
1.0000 "application " | TOPICAL_OIL | Status: DC | PRN
  Administered 2016-05-08: 1 via TOPICAL
  Filled 2016-05-07: qty 120

## 2016-05-07 MED ORDER — SODIUM CHLORIDE 0.9 % IV SOLN
INTRAVENOUS | Status: DC | PRN
  Administered 2016-05-07: 70 mL

## 2016-05-07 MED ORDER — PRENATAL MULTIVITAMIN CH
1.0000 | ORAL_TABLET | Freq: Every day | ORAL | Status: DC
  Administered 2016-05-07 – 2016-05-09 (×3): 1 via ORAL
  Filled 2016-05-07 (×3): qty 1

## 2016-05-07 MED ORDER — SODIUM CHLORIDE FLUSH 0.9 % IV SOLN
INTRAVENOUS | Status: AC
Start: 2016-05-07 — End: 2016-05-07
  Filled 2016-05-07: qty 50

## 2016-05-07 MED ORDER — OXYCODONE-ACETAMINOPHEN 5-325 MG PO TABS
1.0000 | ORAL_TABLET | ORAL | Status: DC | PRN
  Administered 2016-05-07 – 2016-05-08 (×3): 1 via ORAL
  Filled 2016-05-07 (×6): qty 1

## 2016-05-07 MED ORDER — NALOXONE HCL 2 MG/2ML IJ SOSY
1.0000 ug/kg/h | PREFILLED_SYRINGE | INTRAVENOUS | Status: DC | PRN
  Filled 2016-05-07: qty 2

## 2016-05-07 MED ORDER — FENTANYL CITRATE (PF) 250 MCG/5ML IJ SOLN
INTRAMUSCULAR | Status: AC
  Filled 2016-05-07: qty 5

## 2016-05-07 MED ORDER — DEXTROSE 5 % IV SOLN
500.0000 mg | Freq: Once | INTRAVENOUS | Status: DC
  Filled 2016-05-07: qty 500

## 2016-05-07 MED ORDER — BISACODYL 10 MG RE SUPP: 10.0000 mg | Freq: Every day | RECTAL | Status: DC | PRN

## 2016-05-07 MED ORDER — FENTANYL CITRATE (PF) 100 MCG/2ML IJ SOLN
INTRAMUSCULAR | Status: DC | PRN
  Administered 2016-05-07: 100 ug via INTRAVENOUS
  Administered 2016-05-07: 150 ug via INTRAVENOUS

## 2016-05-07 MED ORDER — PHENYLEPHRINE 40 MCG/ML (10ML) SYRINGE FOR IV PUSH (FOR BLOOD PRESSURE SUPPORT)
PREFILLED_SYRINGE | INTRAVENOUS | Status: DC | PRN
  Administered 2016-05-07: 100 ug via INTRAVENOUS

## 2016-05-07 MED ORDER — SCOPOLAMINE 1 MG/3DAYS TD PT72
1.0000 | MEDICATED_PATCH | Freq: Once | TRANSDERMAL | Status: DC
  Administered 2016-05-07: 1.5 mg via TRANSDERMAL
  Filled 2016-05-07: qty 1

## 2016-05-07 MED ORDER — LACTATED RINGERS IV SOLN
INTRAVENOUS | Status: DC
  Administered 2016-05-07 – 2016-05-08 (×3): via INTRAVENOUS

## 2016-05-07 MED ORDER — MORPHINE SULFATE (PF) 0.5 MG/ML IJ SOLN
INTRAMUSCULAR | Status: AC
  Filled 2016-05-07: qty 10

## 2016-05-07 MED ORDER — CEFAZOLIN SODIUM-DEXTROSE 2-4 GM/100ML-% IV SOLN
2.0000 g | INTRAVENOUS | Status: DC
  Filled 2016-05-07 (×2): qty 100

## 2016-05-07 MED ORDER — KETOROLAC TROMETHAMINE 30 MG/ML IJ SOLN: 30.0000 mg | Freq: Four times a day (QID) | INTRAMUSCULAR | Status: DC | PRN

## 2016-05-07 MED ORDER — FENTANYL CITRATE (PF) 100 MCG/2ML IJ SOLN
25.0000 ug | INTRAMUSCULAR | Status: DC | PRN
  Administered 2016-05-07 (×2): 25 ug via INTRAVENOUS
  Filled 2016-05-07: qty 2

## 2016-05-07 MED ORDER — ONDANSETRON HCL 4 MG/2ML IJ SOLN
4.0000 mg | Freq: Three times a day (TID) | INTRAMUSCULAR | Status: DC | PRN
Start: 2016-05-07 — End: 2016-05-09

## 2016-05-07 MED ORDER — SOD CITRATE-CITRIC ACID 500-334 MG/5ML PO SOLN
ORAL | Status: AC
  Filled 2016-05-07: qty 15

## 2016-05-07 MED ORDER — BUPIVACAINE IN DEXTROSE 0.75-8.25 % IT SOLN
INTRATHECAL | Status: DC | PRN
  Administered 2016-05-07: 1.5 mL via INTRATHECAL

## 2016-05-07 MED ORDER — ONDANSETRON HCL 4 MG/2ML IJ SOLN: 4.0000 mg | Freq: Once | INTRAMUSCULAR | Status: DC | PRN

## 2016-05-07 MED ORDER — SODIUM CHLORIDE 0.9% FLUSH: 3.0000 mL | INTRAVENOUS | Status: DC | PRN

## 2016-05-07 MED ORDER — ONDANSETRON HCL 4 MG/2ML IJ SOLN
INTRAMUSCULAR | Status: DC | PRN
  Administered 2016-05-07: 4 mg via INTRAVENOUS

## 2016-05-07 MED ORDER — SIMETHICONE 80 MG PO CHEW: 80.0000 mg | CHEWABLE_TABLET | ORAL | Status: DC | PRN

## 2016-05-07 MED ORDER — WITCH HAZEL-GLYCERIN EX PADS: 1.0000 "application " | MEDICATED_PAD | CUTANEOUS | Status: DC | PRN

## 2016-05-07 MED ORDER — BUPIVACAINE LIPOSOME 1.3 % IJ SUSP
20.0000 mL | Freq: Once | INTRAMUSCULAR | Status: DC
  Filled 2016-05-07: qty 20

## 2016-05-07 MED ORDER — OXYCODONE-ACETAMINOPHEN 5-325 MG PO TABS
2.0000 | ORAL_TABLET | ORAL | Status: DC | PRN
  Administered 2016-05-08 – 2016-05-09 (×7): 2 via ORAL
  Filled 2016-05-07 (×6): qty 2

## 2016-05-07 MED ORDER — DIPHENHYDRAMINE HCL 25 MG PO CAPS: 25.0000 mg | ORAL_CAPSULE | Freq: Four times a day (QID) | ORAL | Status: DC | PRN

## 2016-05-07 MED ORDER — DIBUCAINE 1 % RE OINT: 1.0000 "application " | TOPICAL_OINTMENT | RECTAL | Status: DC | PRN

## 2016-05-07 MED ORDER — SIMETHICONE 80 MG PO CHEW
80.0000 mg | CHEWABLE_TABLET | Freq: Three times a day (TID) | ORAL | Status: DC
  Administered 2016-05-07 – 2016-05-09 (×6): 80 mg via ORAL
  Filled 2016-05-07 (×7): qty 1

## 2016-05-07 MED ORDER — LACTATED RINGERS IV SOLN
INTRAVENOUS | Status: DC
  Administered 2016-05-07 (×2): via INTRAVENOUS

## 2016-05-07 MED ORDER — BUPIVACAINE HCL (PF) 0.75 % IJ SOLN: INTRAMUSCULAR | Status: DC | PRN

## 2016-05-07 MED ORDER — DIPHENHYDRAMINE HCL 25 MG PO CAPS: 25.0000 mg | ORAL_CAPSULE | ORAL | Status: DC | PRN

## 2016-05-07 MED ORDER — ONDANSETRON HCL 4 MG/2ML IJ SOLN
INTRAMUSCULAR | Status: AC
  Filled 2016-05-07: qty 2

## 2016-05-07 MED ORDER — SENNOSIDES-DOCUSATE SODIUM 8.6-50 MG PO TABS
2.0000 | ORAL_TABLET | ORAL | Status: DC
  Administered 2016-05-08: 2 via ORAL
  Filled 2016-05-07 (×2): qty 2

## 2016-05-07 MED ORDER — MORPHINE SULFATE-NACL 0.5-0.9 MG/ML-% IV SOSY
PREFILLED_SYRINGE | INTRAVENOUS | Status: DC | PRN
  Administered 2016-05-07: .2 mg via EPIDURAL

## 2016-05-07 MED ORDER — OXYTOCIN 40 UNITS IN LACTATED RINGERS INFUSION - SIMPLE MED
2.5000 [IU]/h | INTRAVENOUS | Status: AC
  Filled 2016-05-07: qty 1000

## 2016-05-07 MED ORDER — FLEET ENEMA 7-19 GM/118ML RE ENEM
1.0000 | ENEMA | Freq: Every day | RECTAL | Status: DC | PRN
Start: 2016-05-07 — End: 2016-05-09

## 2016-05-07 MED ORDER — NALOXONE HCL 0.4 MG/ML IJ SOLN: 0.4000 mg | INTRAMUSCULAR | Status: DC | PRN

## 2016-05-07 MED ORDER — OXYTOCIN 40 UNITS IN LACTATED RINGERS INFUSION - SIMPLE MED
INTRAVENOUS | Status: AC
  Filled 2016-05-07: qty 1000

## 2016-05-07 MED ORDER — MEPERIDINE HCL 25 MG/ML IJ SOLN: 6.2500 mg | INTRAMUSCULAR | Status: DC | PRN

## 2016-05-07 MED ORDER — MEASLES, MUMPS & RUBELLA VAC ~~LOC~~ INJ
0.5000 mL | INJECTION | Freq: Once | SUBCUTANEOUS | Status: DC
  Filled 2016-05-07: qty 0.5

## 2016-05-07 MED ORDER — OXYTOCIN 40 UNITS IN LACTATED RINGERS INFUSION - SIMPLE MED
INTRAVENOUS | Status: DC | PRN
  Administered 2016-05-07: 1000 mL via INTRAVENOUS

## 2016-05-07 SURGICAL SUPPLY — 26 items
BARRIER ADHS 3X4 INTERCEED (GAUZE/BANDAGES/DRESSINGS) ×3 IMPLANT
BRR ADH 4X3 ABS CNTRL BYND (GAUZE/BANDAGES/DRESSINGS) ×1
CANISTER SUCT 3000ML (MISCELLANEOUS) ×3 IMPLANT
CATH KIT ON-Q SILVERSOAK 5IN (CATHETERS) IMPLANT
CHLORAPREP W/TINT 26ML (MISCELLANEOUS) ×3 IMPLANT
DRSG TELFA 3X8 NADH (GAUZE/BANDAGES/DRESSINGS) ×3 IMPLANT
ELECT REM PT RETURN 9FT ADLT (ELECTROSURGICAL) ×3
ELECTRODE REM PT RTRN 9FT ADLT (ELECTROSURGICAL) ×1 IMPLANT
GAUZE SPONGE 4X4 12PLY STRL (GAUZE/BANDAGES/DRESSINGS) ×3 IMPLANT
GOWN STRL REUS W/ TWL LRG LVL3 (GOWN DISPOSABLE) ×3 IMPLANT
GOWN STRL REUS W/TWL LRG LVL3 (GOWN DISPOSABLE) ×6
LIQUID BAND (GAUZE/BANDAGES/DRESSINGS) ×3 IMPLANT
NS IRRIG 1000ML POUR BTL (IV SOLUTION) ×3 IMPLANT
PAD OB MATERNITY 4.3X12.25 (PERSONAL CARE ITEMS) ×3 IMPLANT
PAD PREP 24X41 OB/GYN DISP (PERSONAL CARE ITEMS) ×3 IMPLANT
SUT MNCRL 4-0 (SUTURE) ×3
SUT MNCRL 4-0 27XMFL (SUTURE) ×1
SUT PDS AB 1 TP1 96 (SUTURE) ×3 IMPLANT
SUT PLAIN 2 0 XLH (SUTURE) ×3 IMPLANT
SUT PLAIN GUT 2-0 30 C14 SG823 (SUTURE) ×3
SUT VIC AB 0 CT1 36 (SUTURE) ×9 IMPLANT
SUT VIC AB 3-0 SH 27 (SUTURE) ×3
SUT VIC AB 3-0 SH 27X BRD (SUTURE) ×1 IMPLANT
SUTURE MNCRL 4-0 27XMF (SUTURE) ×1 IMPLANT
SUTURE PLN GUT2-0 30 C14 SG823 (SUTURE) ×1 IMPLANT
SYR 30ML LL (SYRINGE) ×6 IMPLANT

## 2016-05-07 NOTE — Anesthesia Procedure Notes (Signed)
Spinal  Patient location during procedure: OR Staffing Anesthesiologist: Tou Hayner Performed: anesthesiologist  Preanesthetic Checklist Completed: patient identified, site marked, surgical consent, pre-op evaluation, timeout performed, IV checked and risks and benefits discussed Spinal Block Patient position: sitting Prep: Betadine Patient monitoring: heart rate, cardiac monitor, continuous pulse ox and blood pressure Approach: midline Location: L3-4 Injection technique: single-shot Needle Needle type: Pencil-Tip  Needle gauge: 25 G Needle length: 9 cm Assessment Sensory level: T10     

## 2016-05-07 NOTE — Progress Notes (Signed)
Late entry: Dr. Dalbert GarnetBeasley notified of no cervical change in >4 hours with adequate ctxs on Pitocin and AROM.  Cervical edema still present.  Patient in severe distress due to painful uterine contractions and epidural no longer working despite increasing the rate.  Anesthesia on call, did not come to re-dose patient.  Family very upset as well.  Discussed with Dr. Dalbert GarnetBeasley and she advises primary LTCS and I called an urgent c/s.  Discussed with pt. And family and they agree.  Dr. Dalbert GarnetBeasley in route.   Carlean JewsMeredith Ekansh Sherk, CNM

## 2016-05-07 NOTE — Progress Notes (Signed)
S:  Pt. Still feeling comfortable       No cervical change despite Pitocin at 10 milliunits        Discussed AROM and pt.and family agree  O:  VS: Blood pressure 129/71, pulse 75, temperature 98.6 F (37 C), temperature source Oral, resp. rate 18, height 5\' 6"  (1.676 m), weight 76.7 kg (169 lb), SpO2 100 %.        FHR : baseline 120 bpm / variability moderate / accelerations + / no decelerations        Toco: contractions every 2-4 minutes / mild-moderate         Cervix : Dilation: 5.5 Effacement (%): 80, 90 Cervical Position: Middle Station: -2, -1 Presentation: Vertex Exam by:: M. Lacresha Fusilier CNM        Membranes: AROM for scant clear fluid  A: Latent labor     FHR category 1  P: AROM and IUPC placed     Continue Pitocin augmentation     Reassess in 1-2 hours      Anticipate NSVD  Carlean JewsMeredith Avionna Bower, CNM

## 2016-05-07 NOTE — Progress Notes (Addendum)
S:  Feeling painful contractions on left side      Early variable decels noted   O:  VS: Blood pressure (!) 120/57, pulse 78, temperature 98.3 F (36.8 C), temperature source Oral, resp. rate 18, height 5\' 6"  (1.676 m), weight 76.7 kg (169 lb), SpO2 99 %.        FHR : baseline 120 bpm / variability moderate / accelerations + / early decelerations        Toco: contractions every 1-3 minutes / strong / MVU adequate        Cervix : Dilation: 7 Effacement (%): 90 Cervical Position: Middle Station: -1 Presentation: Vertex Exam by:: M. Myishia Kasik CNM        Membranes: AROM     Feels asynclitic   A: Active labor     FHR category 2  P: Left exaggerated sims position with peanut ball      Anticipate NSVD  Doris Roberson, CNM

## 2016-05-07 NOTE — Anesthesia Post-op Follow-up Note (Cosign Needed)
Anesthesia QCDR form completed.        

## 2016-05-07 NOTE — Op Note (Signed)
  Cesarean Section Procedure Note  Date of procedure: 05/07/2016   Pre-operative Diagnosis: 26yo G1P0 with Intrauterine pregnancy at 4369w0d; failed induction of labor for late term pregnancy; arrest of active phase at 7cm; fetal asynclitism  Post-operative Diagnosis: same, delivered.  Procedure:  Primary Low Transverse Cesarean Section through Pfannenstiel incision  Surgeon: Christeen DouglasBethany Sharifa Bucholz, MD  Assistant(s):  Carlean JewsMeredith Sigmon, CNM  Anesthesia: Epidural anesthesia, Local anesthesia 0.5% bupivacaine with liposomal bupivicaine and Spinal anesthesia  Anesthesiologist: Berdine AddisonMathai Thomas, MD Anesthesiologist: Lenard SimmerAndrew Karenz, MD; Berdine AddisonMathai Thomas, MD CRNA: Darrol Jumpavid Marion, CRNA  Estimated Blood Loss:  500         Drains: none         Total IV Fluids: 800ml  Urine Output: 250ml         Specimens: cord gas for O neg maternal blood         Complications:  None; patient tolerated the procedure well.         Disposition: PACU - hemodynamically stable.         Condition: stable  Findings:  A female infant "Huston FoleyBrady" in cephalic presentation. Amniotic fluid - Clear  Birth weight 7#11oz Apgars of 9 and 9 at one and five minutes respectively.  Intact placenta with a three-vessel cord.  Grossly normal uterus, tubes and ovaries bilaterally. No intraabdominal adhesions were noted.  Indications: failed induction and failure to progress: arrest of dilation  Procedure Details  The patient was taken to Operating Room, identified as the correct patient and the procedure verified as C-Section Delivery. A formal Time Out was held with all team members present and in agreement.  After induction of anesthesia, the patient was draped and prepped in the usual sterile manner. A Pfannenstiel skin incision was made and carried down through the subcutaneous tissue to the fascia. Fascial incision was made and extended transversely with the Mayo scissors. The fascia was separated from the underlying rectus tissue superiorly  and inferiorly. The peritoneum was identified and entered bluntly. Peritoneal incision was extended longitudinally. The utero-vesical peritoneal reflection was incised transversely and a bladder flap was created digitally.   A low transverse hysterotomy was made. The fetus was delivered atraumatically. The umbilical cord was clamped x2 and cut and the infant was handed to the awaiting pediatricians. The placenta was removed intact and appeared normal, intact, and with a 3-vessel cord.   The uterus was exteriorized and cleared of all clot and debris. The hysterotomy was closed with running sutures of 0-Vicryl. A second imbricating layer was placed with the same suture. Excellent hemostasis was observed. The peritoneal cavity was cleared of all clots and debris. The uterus was returned to the abdomen.   The pelvis was irrigated and again, excellent hemostasis was noted. The fascia was then reapproximated with running sutures of 0 Vicryl.  The subcutaneous tissue was reapproximated with interrupted sutures of 0 Vicry. The skin was reapproximated with a 4-0 Monocryl subcuticular stitch. 20ml (in 30 of 0.5% bupivicaine and 50ml of NSS) of liposomal bupivicaine placed in the fascial and skin lines.  Instrument, sponge, and needle counts were correct prior to the abdominal closure and at the conclusion of the case.   The patient tolerated the procedure well and was transferred to the recovery room in stable condition.   Christeen DouglasBEASLEY, Parissa Chiao, MD 05/07/2016

## 2016-05-07 NOTE — Progress Notes (Signed)
S:  Pt. And family very upset and anxious about fetal monitoring strip. Pitocin has been off since 0445 with good recovery.  Pt. And family concerned about next steps.   Pt. Is comfortable with epidural   O:  VS: Blood pressure 115/81, pulse 94, temperature 99.2 F (37.3 C), temperature source Oral, resp. rate 18, height 5\' 6"  (1.676 m), weight 76.7 kg (169 lb), SpO2 100 %.        FHR : baseline 120 bpm / variability moderate / accelerations + / occasional variable and early decelerations        Toco: contractions every 2-555minutes / moderate / MVU inadequate now that Pitocin has been off        Cervix : 7cm/90%/-1/vtx/ cervical edema noted/ likely asynclitic   A: Protracted active labor     FHR category 2  P: Good recovery of FHR with interventions      Discussed findings with Dr. Dalbert GarnetBeasley - she recommends Benadryl 25mg  IVP x 1 dose for cervical edema and advised to restart the Pitocin at 2 milliunits and increase by 2 milliunits.  If she is still not making change, or there is a change in FHR, we will proceed with LTCS  Carlean JewsMeredith Erikka Follmer, CNM

## 2016-05-07 NOTE — Progress Notes (Addendum)
S:  Called to discuss recurrent early and a few late deceleration     Pitocin decreased to 4 mu, position change, and IVF bolus complete     More comfortable with epidural    O:  VS: Blood pressure 117/64, pulse 75, temperature 98.5 F (36.9 C), temperature source Oral, resp. rate 18, height 5\' 6"  (1.676 m), weight 76.7 kg (169 lb), SpO2 98 %.        FHR : baseline 150 bpm / variability moderate / accelerations + / recurrent early and late decelerations        Toco: contractions every 1-5 minutes / strong / MVU adequate        Cervix : Dilation: 7 Effacement (%): 90 Cervical Position: Middle Station: -1 Presentation: Vertex Exam by:: [facility] RN        Membranes: AROM  A: Protracted Active labor     FHR category 2  P: protracted active labor likely due to persistent asyncliticism on each exam.  Possible molding noted by RN.  We will continue to monitor with decreased Pitocin rate. If continue to have recurrent decelerations, will call Dr. Dalbert GarnetBeasley to discuss plan.   Doris JewsMeredith Zania Roberson, CNM

## 2016-05-07 NOTE — Transfer of Care (Signed)
Immediate Anesthesia Transfer of Care Note  Patient: Doris Roberson  Procedure(s) Performed: Procedure(s) with comments: CESAREAN SECTION (N/A) - Female born @ [id number] Apgars: 9/9 Weight:7lb 11oz  Patient Location: PACU  Anesthesia Type:Spinal  Level of Consciousness: awake, alert , oriented and patient cooperative  Airway & Oxygen Therapy: Patient Spontanous Breathing  Post-op Assessment: Report given to RN and Post -op Vital signs reviewed and stable  Post vital signs: Reviewed and stable  Last Vitals:  Vitals:   05/07/16 0635 05/07/16 0642  BP: 134/79 (!) 138/99  Pulse: 92 (!) 107  Resp: 18   Temp: 36.9 C     Last Pain:  Vitals:   05/07/16 0635  TempSrc: Oral  PainSc:          Complications: No apparent anesthesia complications

## 2016-05-07 NOTE — Discharge Summary (Signed)
Obstetrical Discharge Summary  Patient Name: Doris Roberson DOB: 1991/01/27 MRN: [id number]  Date of Admission: 05/05/2016 Date of Discharge: 05/09/2016  Primary OB: [facility]  Gestational Age at Delivery: 102w0d   Antepartum complications: Tobacco Use in Pregnancy 0.5PPD, RH Negative s/p Rhogam, Small Subchorionic Hemorrhage noted on [place] on, 7/7/17Facial swelling at 21 wks without other s/s PreE, Epistaxsis, Pilonidal cyst: Admitting Diagnosis: IOL for late term pregnancy Secondary Diagnosis: Patient Active Problem List   Diagnosis Date Noted  . Labor and delivery, indication for care 05/05/2016  . Bacteria in urine 04/14/2016  . Pilonidal cyst 04/08/2016  . Hyperemesis complicating pregnancy, antepartum 03/15/2016  . Rh negative status during pregnancy in second trimester 01/31/2016  . Smoking (tobacco) complicating pregnancy, third trimester 01/31/2016  . Family history of autism 10/22/2015  . First trimester screening 10/22/2015  . Tobacco use in pregnancy, antepartum 10/05/2015  . Alcohol abuse   . Mood disorder (HCC)   . Rash 09/18/2010  . Pregnant state, incidental 08/14/2010  . 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 ALCOHOL ABUSE 03/12/2010  . DEPRESSION 03/12/2010  . WEIGHT LOSS 03/12/2010    Augmentation: AROM, Pitocin and Cytotec Complications: None Intrapartum complications/course: Pt admitted for iol with cervical ripening. Adequate contractions after AROM with pitocin for >4hrs with no cervical change but +cervical edema, and a Cat II strip at times, though always with accels and mod variability. Poorly working epidural and pt required spinal for surgery. pLTCS on 05/07/16 at 0700.  Date of Delivery: 05/07/16 Delivered By: Karena Addison, CNM Delivery Type: primary cesarean section, low transverse incision Anesthesia: epidural, spinal and local used Placenta: Spontaneous Laceration: none Episiotomy: none Newborn Data: Live born female  "Doris Roberson" Birth Weight: 7 lb 11.1 oz (3490 g) APGAR: 9, 9    Discharge Physical Exam: 05/09/16 BP 112/60 (BP Location: Left Arm)   Pulse 70   Temp 98.4 F (36.9 C) (Oral)   Resp 17   Ht 5\' 6"  (1.676 m)   Wt 76.7 kg (169 lb)   SpO2 100%   Breastfeeding? Unknown   BMI 27.28 kg/m   General: NAD CV: RRR Pulm: CTABL, nl effort ABD: s/nd/nt, fundus firm and below the umbilicus Lochia: moderate Incision: c/d/i  DVT Evaluation: LE non-ttp, no evidence of DVT on exam.  Hemoglobin  Date Value Ref Range Status  05/08/2016 10.0 (L) 12.0 - 16.0 g/dL Final   HCT  Date Value Ref Range Status  05/08/2016 29.5 (L) 35.0 - 47.0 % Final    Post partum course: Mild ABL Anemia  Postpartum Procedures: FE replacement, s/p Rhogam  Disposition: stable, discharge to home. Baby Feeding: breastmilk Baby Disposition: home with mom  Rh Immune globulin given: no Rubella vaccine given: no   Contraception: OCPs  Prenatal Labs:  Prenatal Labs: ABO, Rh:  O Negative Antibody:  Negative Rubella:   Immune Varicella: Immune  RPR:   NR HBsAg:   Negative HIV:   Negative GTT: 81 GBS:   Negative  1st trimester genetic screening: Negative AFP: Negative Flu: given 01/03/16 Tdap: given 02/15/16    Plan:  Doris Roberson was discharged to home in good condition. Follow-up appointment at [facility] 2 weeks and at 6 weeks   Discharge Medications: Allergies as of 05/09/2016      Reactions   Other Other (See Comments)      Medication List    STOP taking these medications   cephALEXin 500 MG capsule Commonly known as:  KEFLEX  HYDROcodone-acetaminophen 5-325 MG tablet Commonly known as:  NORCO/VICODIN   metoCLOPramide 10 MG tablet Commonly known as:  REGLAN   promethazine 25 MG tablet Commonly known as:  PHENERGAN     TAKE these medications   fluticasone 50 MCG/ACT nasal spray Commonly known as:  FLONASE Place 2 sprays into both nostrils daily.    hydrocortisone 25 MG suppository Commonly known as:  ANUSOL-HC Place 1 suppository (25 mg total) rectally 2 (two) times daily.   ibuprofen 600 MG tablet Commonly known as:  ADVIL,MOTRIN Take 1 tablet (600 mg total) by mouth every 6 (six) hours.   oxyCODONE-acetaminophen 5-325 MG tablet Commonly known as:  PERCOCET/ROXICET Take 1-2 tablets by mouth every 4 (four) hours as needed (pain scale 4-7).   prenatal multivitamin Tabs tablet Take 1 tablet by mouth daily at 12 noon.       Follow-up Information    Christeen DouglasBEASLEY, BETHANY, MD. Schedule an appointment as soon as possible for a visit in 2 week(s).   Specialty:  Obstetrics and Gynecology Why:  Postpartum visit and needs an early postpartum depression screening.  Then in 6 weeks for PP visit  Contact information: [address] [place] [id number] [phone]           Signed:  Carlean JewsMeredith Vear Staton, CNM

## 2016-05-07 NOTE — Progress Notes (Signed)
Doris Roberson is a 26 y.o. G2P0010 at 4720w0d with iol for late term pregnancy  Subjective: Feeling contractions uncomfortably, ready for delivery  Objective: BP 134/79 (BP Location: Right Arm)   Pulse 92   Temp 98.5 F (36.9 C) (Oral)   Resp 18   Ht 5\' 6"  (1.676 m)   Wt 169 lb (76.7 kg)   SpO2 99%   BMI 27.28 kg/m  I/O last 3 completed shifts: In: 3816.7 [I.V.:3816.7] Out: 75 [Urine:75] No intake/output data recorded.  FHT:  FHR: 120 bpm, variability: moderate,  accelerations:  Present,  decelerations:  Present early and occasional decels UC:   regular, every 5 minutes SVE:   Dilation: 7 Effacement (%): 90 Station: -1 [facility]:: [facility]  Labs: Lab Results  Component Value Date   WBC 16.4 (H) 05/05/2016   HGB 12.4 05/05/2016   HCT 35.0 05/05/2016   MCV 90.2 05/05/2016   PLT 406 05/05/2016    Assessment / Plan: Arrest in active phase of labor; adequate contractions with pit and IUPC x4hrs without cervical change. Edematous cervix.   The risks of cesarean section discussed with the patient included but were not limited to: bleeding which may require transfusion or reoperation; infection which may require antibiotics; injury to bowel, bladder, ureters or other surrounding organs; injury to the fetus; need for additional procedures including hysterectomy in the event of a life-threatening hemorrhage; placental abnormalities wth subsequent pregnancies, incisional problems, thromboembolic phenomenon and other postoperative/anesthesia complications. The patient concurred with the proposed plan, giving informed written consent for the procedure.   Patient has been NPO since yesterday and she will remain NPO for procedure. Anesthesia and OR aware. Preoperative prophylactic antibiotics and SCDs ordered on call to the OR.  To OR when ready.  Christeen DouglasBEASLEY, Dorella Laster 05/07/2016, 7:01 AM

## 2016-05-08 LAB — CBC
HCT: 29.5 % — ABNORMAL LOW (ref 35.0–47.0)
Hemoglobin: 10 g/dL — ABNORMAL LOW (ref 12.0–16.0)
MCH: 30.9 pg (ref 26.0–34.0)
MCHC: 34 g/dL (ref 32.0–36.0)
MCV: 91 fL (ref 80.0–100.0)
PLATELETS: 324 10*3/uL (ref 150–440)
RBC: 3.25 MIL/uL — ABNORMAL LOW (ref 3.80–5.20)
RDW: 13.7 % (ref 11.5–14.5)
WBC: 19.3 10*3/uL — ABNORMAL HIGH (ref 3.6–11.0)

## 2016-05-08 LAB — FETAL SCREEN: Fetal Screen: NEGATIVE

## 2016-05-08 MED ORDER — RHO D IMMUNE GLOBULIN 1500 UNIT/2ML IJ SOSY
300.0000 ug | PREFILLED_SYRINGE | Freq: Once | INTRAMUSCULAR | Status: AC
  Administered 2016-05-08: 300 ug via INTRAVENOUS
  Filled 2016-05-08: qty 2

## 2016-05-08 NOTE — Anesthesia Post-op Follow-up Note (Signed)
  Anesthesia Pain Follow-up Note  Patient: Doris Roberson  Day #: 1  Date of Follow-up: 05/08/2016 Time: 9:01 AM  Last Vitals:  Vitals:   05/08/16 0419 05/08/16 0838  BP: (!) 112/55 105/63  Pulse: 70 65  Resp: 20 18  Temp: 36.9 C 36.6 C    Level of Consciousness: alert  Pain: mild   Side Effects:None  Catheter Site Exam:clean, dry     Plan: D/C from anesthesia care at surgeon's request  Rica MastBachich,  Suhailah Kwan M

## 2016-05-08 NOTE — Anesthesia Postprocedure Evaluation (Signed)
Anesthesia Post Note  Patient: Doris Roberson  Procedure(s) Performed: Procedure(s) (LRB): CESAREAN SECTION (N/A)  Patient location during evaluation: Mother Baby Anesthesia Type: Epidural and Spinal Level of consciousness: awake and alert and oriented Pain management: pain level controlled Vital Signs Assessment: post-procedure vital signs reviewed and stable Respiratory status: spontaneous breathing Cardiovascular status: stable Postop Assessment: adequate PO intake and no signs of nausea or vomiting Anesthetic complications: no     Last Vitals:  Vitals:   05/08/16 0419 05/08/16 0838  BP: (!) 112/55 105/63  Pulse: 70 65  Resp: 20 18  Temp: 36.9 C 36.6 C    Last Pain:  Vitals:   05/08/16 0838  TempSrc: Oral  PainSc:                  Rica MastBachich,  Jadien Lehigh M

## 2016-05-08 NOTE — Lactation Note (Signed)
This note was copied from a baby's chart. Lactation Consultation Note  Patient Name: Doris Roberson [id number]'s Date: 05/08/2016 Reason for consult: Follow-up assessment   Maternal Data    Feeding Feeding Type: Breast Fed (per mom) Nipple Type:  (no slow flow at 12) Length of feed: 15 min  LATCH Score/Interventions     Baby was crying and mother was crying. The baby was not rooting or cueing and after burping calmed. We reviewed normal breast feeding and reviewed normal diaper amounts in the first 24 hours.    Consult Status  ongoing    Trudee GripCarolyn P Davonta Stroot 05/08/2016, 5:50 PM

## 2016-05-08 NOTE — Progress Notes (Signed)
  Subjective:  Doing well.  No complaints. Has not yet voided or ambulated, tolerating regular PO diet, tolerating pain with PO meds. Denies: CP SOB F/C, N/V, calf pain     Objective:  Blood pressure (!) 112/55, pulse 70, temperature 98.4 F (36.9 C), temperature source Oral, resp. rate 20, height 5\' 6"  (1.676 m), weight 76.7 kg (169 lb), SpO2 98 %, unknown if currently breastfeeding.  General: NAD Pulmonary: no increased work of breathing Abdomen: non-distended, non-tender, fundus firm at level of umbilicus Incision: bandaged, c/d/i Extremities: no edema, no erythema, no tenderness   Intake/Output Summary (Last 24 hours) at 05/08/16 0754 Last data filed at 05/08/16 0545  Gross per 24 hour  Intake          2845.83 ml  Output             3600 ml  Net          -754.17 ml     Assessment:   26 y.o. G2P1010 postoperativeday # 1 s/p LTCS for FTP   Plan:  1) Acute blood loss anemia - hemodynamically stable and asymptomatic  2) foley out, get OOB, continue routine Post op care.  3) will need Rhogam for Rh + infant  4) continue inpatient status

## 2016-05-09 LAB — RHOGAM INJECTION: UNIT DIVISION: 0

## 2016-05-09 MED ORDER — PRENATAL MULTIVITAMIN CH: 1.0000 | ORAL_TABLET | Freq: Every day | ORAL | 3 refills | Status: DC

## 2016-05-09 MED ORDER — OXYCODONE-ACETAMINOPHEN 5-325 MG PO TABS: 1.0000 | ORAL_TABLET | ORAL | 0 refills | Status: DC | PRN

## 2016-05-09 MED ORDER — IBUPROFEN 600 MG PO TABS: 600.0000 mg | ORAL_TABLET | Freq: Four times a day (QID) | ORAL | 0 refills | Status: DC

## 2016-05-09 MED ORDER — FERROUS SULFATE 325 (65 FE) MG PO TABS: 325.0000 mg | ORAL_TABLET | Freq: Every day | ORAL | 3 refills | Status: DC

## 2016-05-09 NOTE — Lactation Note (Signed)
This note was copied from a baby's chart. Lactation Consultation Note  Patient Name: Boy Risa GrillLeeanna Chittenden [id number]'s Date: 05/09/2016     Maternal Data  Mom wants to pump and bottlefeed, has been pumping 20+ cc  Each session, she has a friend's Medela pump  that she plans on using at home.  Encouraged to pump at least 8 times in 24 hrs, nipples tender, encouraged pt to decrease level of suction on pump, should not feel pain, just tugging, given instructions on using coconut oil, given gel pads with instruction in use.  She is very teary and overwhelmed, informed her that she may try to put baby to breast if she wants when her milk productiton is increased and she is less overwhelmed.     Feeding    LATCH Score/Interventions                      Lactation Tools Discussed/Used     Consult Status      Dyann KiefMarsha D Emmogene Simson 05/09/2016, 5:12 PM

## 2016-05-09 NOTE — Discharge Instructions (Signed)

## 2017-05-08 IMAGING — US US OB COMP LESS 14 WK
1 series · 14 of 28 positions shown · non-contrast
Comparison: None.

CLINICAL DATA: Encounter for supervision of pregnancy. Size less
than dates.

EXAM:
OBSTETRIC <14 WK ULTRASOUND
TECHNIQUE: Transabdominal ultrasound was performed for evaluation of the
gestation as well as the maternal uterus and adnexal regions.

[Series 1: us ob comp less 14 wk · 0.17mm/px · 14 of 61 slices shown]
[im 3/61]
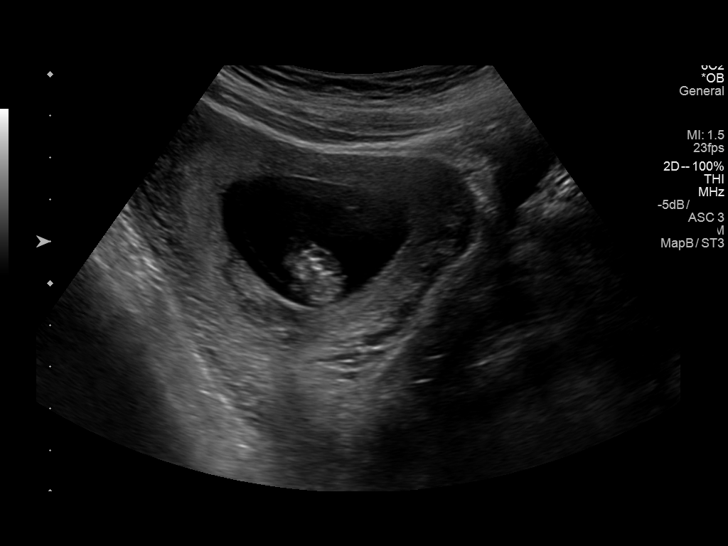
[im 7/61]
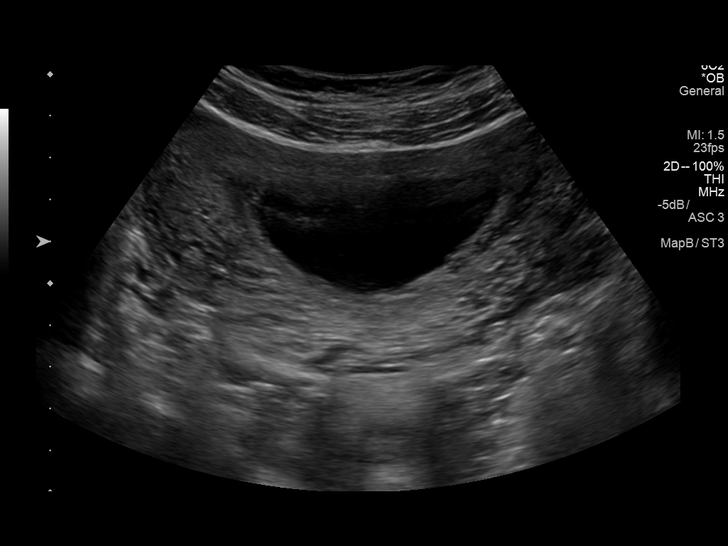
[im 12/61]
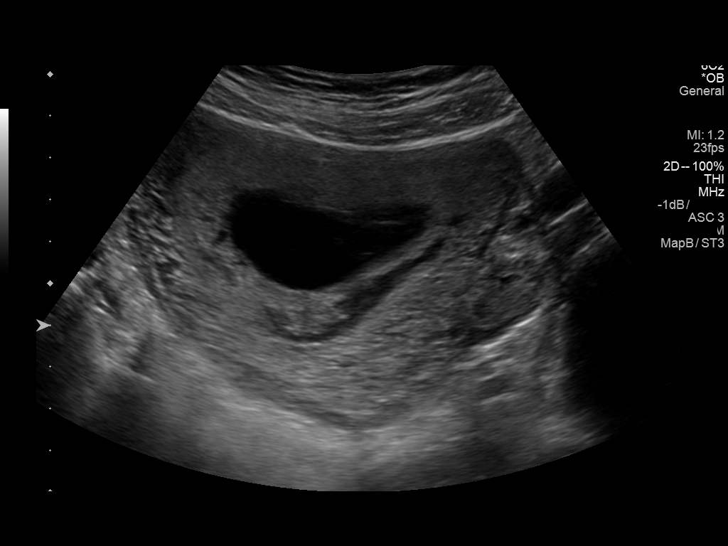
[im 16/61]
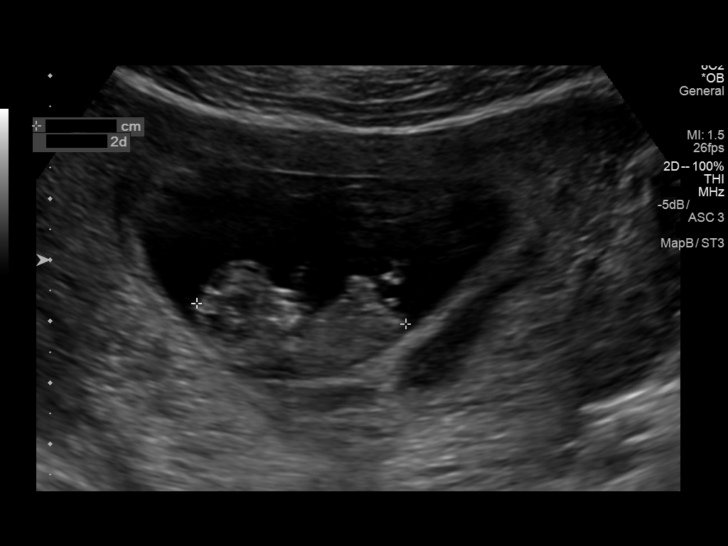
[im 21/61]
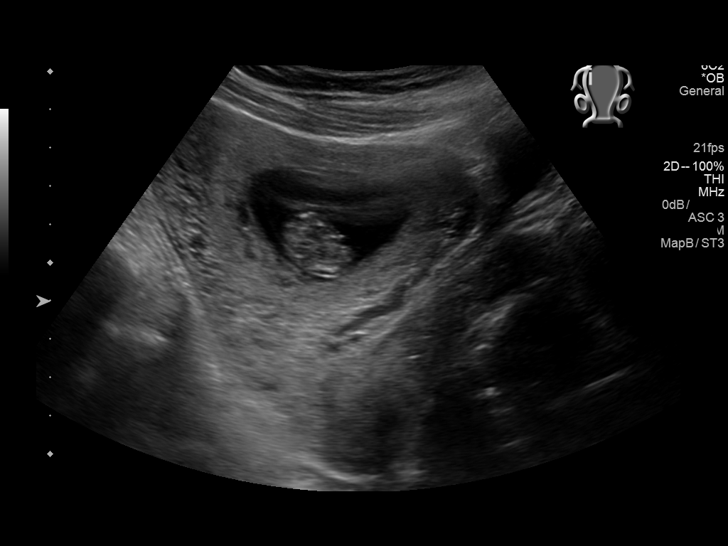
[im 25/61]
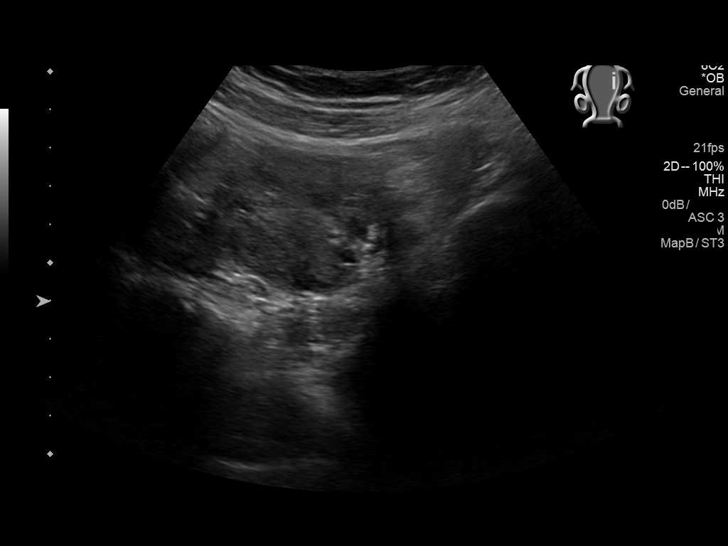
[im 29/61]
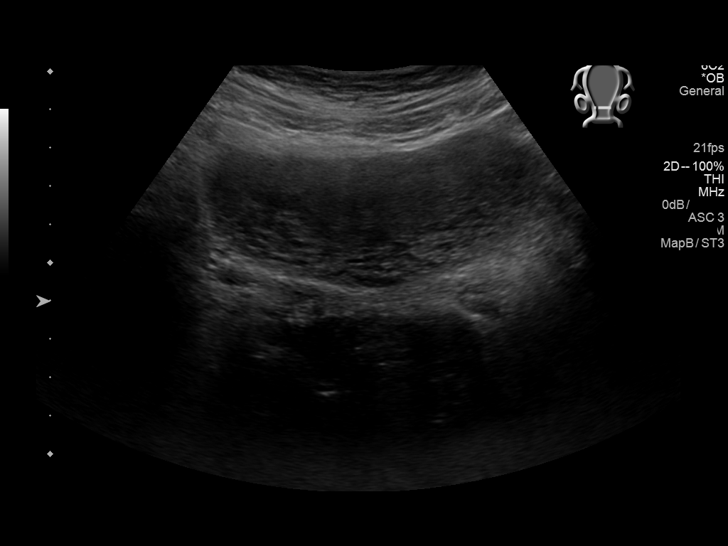
[im 34/61]
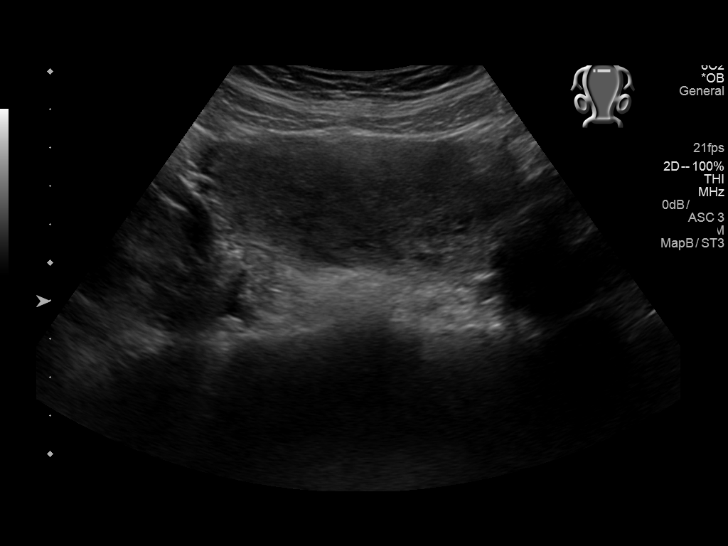
[im 38/61]
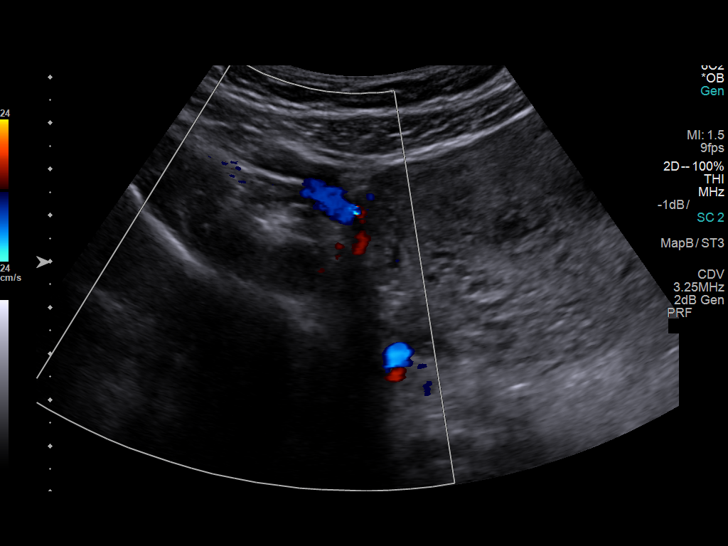
[im 43/61]
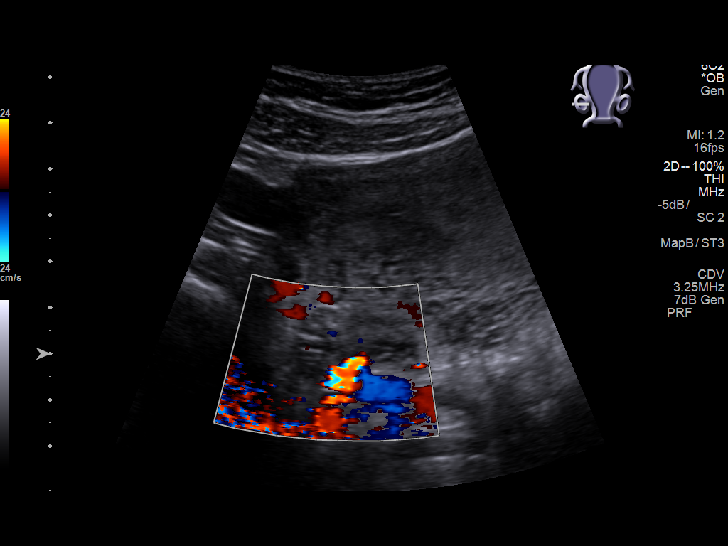
[im 47/61]
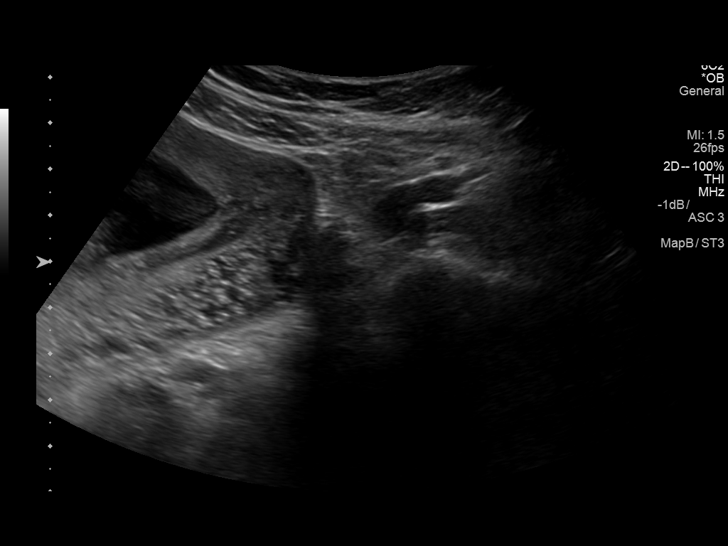
[im 52/61]
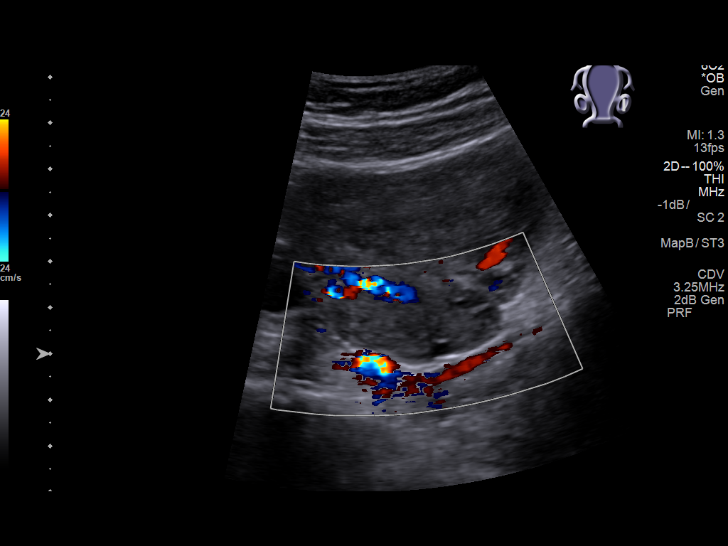
[im 56/61]
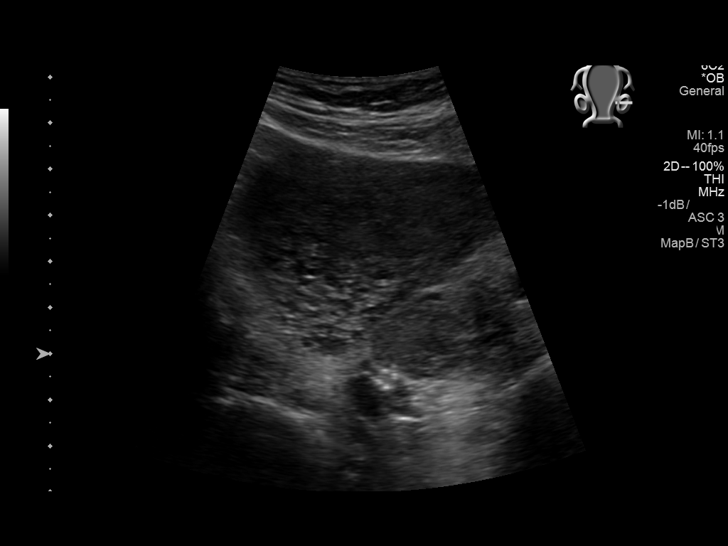
[im 61/61]
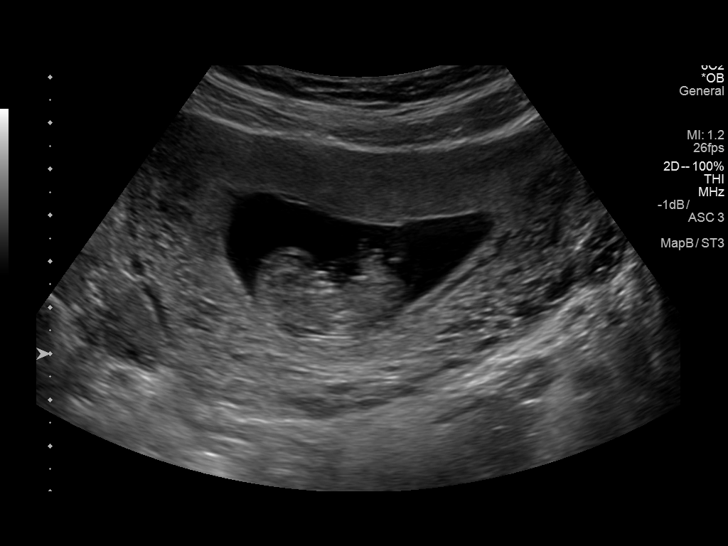

[14 of 28 positions shown; findings below may reference images not displayed]

FINDINGS: Intrauterine gestational sac: Present

Yolk sac:  No longer seen

Embryo:  Present

Cardiac Activity: Present

Heart Rate: 171 bpm

CRL:   34  mm   10 w 2 d                  US EDC: 04/30/2016

Subchorionic hemorrhage: Small subchronic hemorrhage along the left
aspect of the gestational sac, 3 cm in maximal length, 5 mm in
maximal thickness.

Maternal uterus/adnexae: Negative ovaries with corpus luteum on the
left. No adnexal mass or free pelvic fluid.
IMPRESSION: 1. Single living intrauterine pregnancy measuring 10 weeks 2 days
+/-6 days.
2. Small subchronic hemorrhage as described.

## 2017-07-21 ENCOUNTER — Emergency Department
Admission: EM | Admit: 2017-07-21 | Discharge: 2017-07-21 | Disposition: A | Discharge status: Home / Self Care | Payer: Medicaid Other | Attending: Emergency Medicine | Admitting: Emergency Medicine

## 2017-07-21 ENCOUNTER — Encounter: Payer: Self-pay | Admitting: Emergency Medicine

## 2017-07-21 ENCOUNTER — Other Ambulatory Visit: Payer: Self-pay

## 2017-07-21 DIAGNOSIS — R519 Headache, unspecified: Secondary | ICD-10-CM

## 2017-07-21 DIAGNOSIS — R51 Headache: Secondary | ICD-10-CM | POA: Diagnosis not present

## 2017-07-21 DIAGNOSIS — K0889 Other specified disorders of teeth and supporting structures: Secondary | ICD-10-CM | POA: Diagnosis present

## 2017-07-21 MED ORDER — KETOROLAC TROMETHAMINE 30 MG/ML IJ SOLN
30.0000 mg | Freq: Once | INTRAMUSCULAR | Status: AC
  Administered 2017-07-21: 30 mg via INTRAMUSCULAR
  Filled 2017-07-21: qty 1

## 2017-07-21 MED ORDER — KETOROLAC TROMETHAMINE 10 MG PO TABS: 10.0000 mg | ORAL_TABLET | Freq: Three times a day (TID) | ORAL | 0 refills | Status: DC

## 2017-07-21 MED ORDER — METOCLOPRAMIDE HCL 10 MG PO TABS
10.0000 mg | ORAL_TABLET | Freq: Once | ORAL | Status: AC
  Administered 2017-07-21: 10 mg via ORAL
  Filled 2017-07-21: qty 1

## 2017-07-21 MED ORDER — METOCLOPRAMIDE HCL 5 MG PO TABS: 5.0000 mg | ORAL_TABLET | Freq: Three times a day (TID) | ORAL | 0 refills | Status: DC | PRN

## 2017-07-21 NOTE — ED Triage Notes (Signed)
Here for pain to left lower wisdom tooth. Has appt to get pulled Monday but cannot wait until then.  Has started amoxicillin.  Pain causing headache.

## 2017-07-21 NOTE — Discharge Instructions (Addendum)
Your exam is consistent with headache that may be caused by tension or sinus pressure. Continue to dose the prescription antibiotic prescribed by your dental provider. You may take the prescription meds for headache pain until Saturday. Follow-up with the dental provider as scheduled. Return to the ED as needed.

## 2017-07-21 NOTE — ED Provider Notes (Signed)
[facility] Emergency Department Provider Note ____________________________________________  Time seen: 1740  I have reviewed the triage vital signs and the nursing notes.  HISTORY  Chief Complaint  Dental Pain  HPI Doris Roberson is a 27 y.o. female presents to the ED for evaluation and management of left lower jaw pain.  Patient specifically reports pain to the left lower jaw wisdom tooth, which is scheduled to be extracted on Monday.  Patient is currently taking a course of amoxicillin prescribed prophylactically.  He denies any focal gum swelling, abscess, or purulent drainage from the jaw.  She describes pain to the left lower jaw is causing headache pain.  Scribes a pulsating headache localizes to the ethmoid sinus region.  She denies any visual disturbance, tinnitus, vertigo, or vomiting.  She is noted some nausea.  She has taken 2 doses of Tylenol and 1 dose of Excedrin without lasting relief.  She denies a history of migraines.  Past Medical History:  Diagnosis Date  . Alcohol abuse    s/[place] re[place] at [facility] [place]  . Depression    h/o Tylenol pm overdose, subsequent admision to behavioral health  . Mood disorder [facility](HCC)     Patient Active Problem List   Diagnosis Date Noted  . Labor and delivery, indication for care 05/05/2016  . Bacteria in urine 04/14/2016  . Pilonidal cyst 04/08/2016  . Hyperemesis complicating pregnancy, antepartum 03/15/2016  . Rh negative status during pregnancy in second trimester 01/31/2016  . Smoking (tobacco) complicating pregnancy, third trimester 01/31/2016  . Family history of autism 10/22/2015  . First trimester screening 10/22/2015  . Tobacco use in pregnancy, antepartum 10/05/2015  . Alcohol abuse   . Mood disorder (HCC)   . Rash 09/18/2010  . Pregnant state, incidental 08/14/2010  . 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 ALCOHOL ABUSE 03/12/2010  . DEPRESSION 03/12/2010  .  WEIGHT LOSS 03/12/2010    Past Surgical History:  Procedure Laterality Date  . ANTERIOR CRUCIATE LIGAMENT REPAIR Left 2008  . CESAREAN SECTION N/A 05/07/2016   Procedure: CESAREAN SECTION;  Surgeon: Christeen DouglasBethany Beasley, MD;  Location: [facility] ORS;  Service: Obstetrics;  Laterality: N/A;  Female born @ [id number] Apgars: 9/9 Weight:7lb 11oz    Prior to Admission medications   Medication Sig Start Date End Date Taking? Authorizing Provider  ferrous sulfate 325 (65 FE) MG tablet Take 1 tablet (325 mg total) by mouth daily with breakfast. 05/09/16   Sigmon, Scarlette SliceMeredith C, CNM  fluticasone (FLONASE) 50 MCG/ACT nasal spray Place 2 sprays into both nostrils daily. 03/28/16   Judeth HornLawrence, Erin, NP  hydrocortisone (ANUSOL-HC) 25 MG suppository Place 1 suppository (25 mg total) rectally 2 (two) times daily. 04/10/16   Armando ReichertHogan, Heather D, CNM  ibuprofen (ADVIL,MOTRIN) 600 MG tablet Take 1 tablet (600 mg total) by mouth every 6 (six) hours. 05/09/16   Karena AddisonSigmon, Meredith C, CNM  ketorolac (TORADOL) 10 MG tablet Take 1 tablet (10 mg total) by mouth every 8 (eight) hours. 07/21/17   Honor Fairbank, Charlesetta IvoryJenise V Bacon, PA-C  metoCLOPramide (REGLAN) 5 MG tablet Take 1 tablet (5 mg total) by mouth every 8 (eight) hours as needed for nausea or vomiting. 07/21/17   Natali Lavallee, Charlesetta IvoryJenise V Bacon, PA-C  oxyCODONE-acetaminophen (PERCOCET/ROXICET) 5-325 MG tablet Take 1-2 tablets by mouth every 4 (four) hours as needed (pain scale 4-7). 05/09/16   Sigmon, Scarlette SliceMeredith C, CNM  Prenatal Vit-Fe Fumarate-FA (PRENATAL MULTIVITAMIN) TABS tablet Take 1 tablet by mouth daily at 12 noon.  [provider]  Prenatal Vit-Fe Fumarate-FA (PRENATAL MULTIVITAMIN) TABS tablet Take 1 tablet by mouth daily at 12 noon. 05/10/16   Karena Addison, CNM    Allergies Other  Family History  Problem Relation Age of Onset  . Alcohol abuse Other   . Heart disease Father     Social History Social History   Tobacco Use  . Smoking status: Current Every Day Smoker     Packs/day: 0.50  . Smokeless tobacco: Never Used  Substance Use Topics  . Alcohol use: No  . Drug use: No    Review of Systems  Constitutional: Negative for fever. Eyes: Negative for visual changes. ENT: Negative for sore throat. Left lower jaw pain Cardiovascular: Negative for chest pain. Respiratory: Negative for shortness of breath. Gastrointestinal: Negative for abdominal pain, vomiting and diarrhea. Reports nausea Musculoskeletal: Negative for back pain. Skin: Negative for rash. Neurological: Negative for focal weakness or numbness. Reports frontal headache ____________________________________________  PHYSICAL EXAM:  VITAL SIGNS: ED Triage Vitals [07/21/17 1718]  Enc Vitals Group     BP 120/86     Pulse Rate (!) 103     Resp 16     Temp 98.4 F (36.9 C)     Temp Source Oral     SpO2 100 %     Weight 110 lb (49.9 kg)     Height 5\' 6"  (1.676 m)     Head Circumference      Peak Flow      Pain Score 9     Pain Loc      Pain Edu?      Excl. in GC?     Constitutional: Alert and oriented. Well appearing and in no distress. Head: Normocephalic and atraumatic. Eyes: Conjunctivae are normal. PERRL. Normal extraocular movements Ears: Canals clear. TMs intact bilaterally. Nose: No congestion/rhinorrhea/epistaxis. Mouth/Throat: Mucous membranes are moist.  Uvula is midline and tonsils are flat.  No oropharyngeal lesions are appreciated.  Patient noted to have a chronically broken left lower third molar.  No focal gum swelling or erythema is appreciated.  No brawny sublingual edema is noted. Neck: Supple. No thyromegaly. Hematological/Lymphatic/Immunological: No cervical lymphadenopathy. Cardiovascular: Normal rate, regular rhythm. Normal distal pulses. Respiratory: Normal respiratory effort. No wheezes/rales/rhonchi. Gastrointestinal: Soft and nontender. No distention. Musculoskeletal: Nontender with normal range of motion in all extremities.  Neurologic:  Normal gait  without ataxia. Normal speech and language. No gross focal neurologic deficits are appreciated. Skin:  Skin is warm, dry and intact. No rash noted. ____________________________________________  PROCEDURES  Procedures Toradol 30 mg IM Reglan 10 mg PO ____________________________________________  INITIAL IMPRESSION / ASSESSMENT AND PLAN / ED COURSE  DDX: dental abscess, tooth impaction,  Migraine, sinus/tension headache  Patient with ED evaluation of headache and dental pain.  Patient has a wisdom tooth in the lower left jaw is scheduled for extraction and 6 days. Her exam is benign at this time. No indication of dental abscess as she is currently prophylactically dosing amoxicillin. There is no acute neurological deficit appreciated on exam. She reports significant improvement in headache pain at the time of discharge, rating pain at 3/10. She will be discharged with Toradol and Reglan. She will see her dental provider as scheduled. Return precautions have been reviewed.  ____________________________________________  FINAL CLINICAL IMPRESSION(S) / ED DIAGNOSES  Final diagnoses:  Pain, dental  Acute nonintractable headache, unspecified headache type      Lissa Hoard, PA-C 07/21/17 1839    Myrna Blazer, MD  07/21/17 2144  

## 2017-09-04 ENCOUNTER — Other Ambulatory Visit: Payer: Self-pay

## 2017-09-04 ENCOUNTER — Emergency Department
Admission: EM | Admit: 2017-09-04 | Discharge: 2017-09-04 | Disposition: A | Discharge status: Home / Self Care | Payer: Medicaid Other | Attending: Emergency Medicine | Admitting: Emergency Medicine

## 2017-09-04 DIAGNOSIS — Z79899 Other long term (current) drug therapy: Secondary | ICD-10-CM | POA: Insufficient documentation

## 2017-09-04 DIAGNOSIS — F439 Reaction to severe stress, unspecified: Secondary | ICD-10-CM | POA: Diagnosis not present

## 2017-09-04 DIAGNOSIS — R197 Diarrhea, unspecified: Secondary | ICD-10-CM | POA: Insufficient documentation

## 2017-09-04 DIAGNOSIS — F17200 Nicotine dependence, unspecified, uncomplicated: Secondary | ICD-10-CM | POA: Diagnosis not present

## 2017-09-04 NOTE — ED Notes (Signed)
Pt verbalized understanding of discharge instructions. NAD at this time. 
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
## 2017-09-04 NOTE — ED Notes (Signed)
First Nurse Note: Pt states that she has had nausea and diarrhea for the past 2 days, unable to eat. Pt states that she does not have PCP. Pt is in NAD at this time.

## 2017-09-04 NOTE — ED Triage Notes (Signed)
Nausea and diarrhea X 3 days. Abdominal cramping for past few days, none today. States that she needs work note. Pt alert and oriented X4, active, cooperative, pt in NAD. RR even and unlabored, color WNL.

## 2018-05-13 ENCOUNTER — Emergency Department (HOSPITAL_COMMUNITY)
Admission: EM | Admit: 2018-05-13 | Discharge: 2018-05-13 | Discharge status: Against Medical Advice | Payer: Medicaid Other | Attending: Emergency Medicine | Admitting: Emergency Medicine

## 2018-05-13 ENCOUNTER — Ambulatory Visit (HOSPITAL_COMMUNITY)
Admission: RE | Admit: 2018-05-13 | Discharge: 2018-05-13 | Disposition: A | Discharge status: Home / Self Care | Payer: Medicaid Other | Attending: Psychiatry | Admitting: Psychiatry

## 2018-05-13 ENCOUNTER — Other Ambulatory Visit: Payer: Self-pay

## 2018-05-13 ENCOUNTER — Encounter (HOSPITAL_COMMUNITY): Payer: Self-pay | Admitting: Emergency Medicine

## 2018-05-13 DIAGNOSIS — F1910 Other psychoactive substance abuse, uncomplicated: Secondary | ICD-10-CM

## 2018-05-13 DIAGNOSIS — F1520 Other stimulant dependence, uncomplicated: Secondary | ICD-10-CM | POA: Diagnosis not present

## 2018-05-13 DIAGNOSIS — Z008 Encounter for other general examination: Secondary | ICD-10-CM | POA: Diagnosis present

## 2018-05-13 DIAGNOSIS — Z79899 Other long term (current) drug therapy: Secondary | ICD-10-CM | POA: Insufficient documentation

## 2018-05-13 DIAGNOSIS — F1320 Sedative, hypnotic or anxiolytic dependence, uncomplicated: Secondary | ICD-10-CM | POA: Insufficient documentation

## 2018-05-13 DIAGNOSIS — F17200 Nicotine dependence, unspecified, uncomplicated: Secondary | ICD-10-CM | POA: Insufficient documentation

## 2018-05-13 DIAGNOSIS — F1410 Cocaine abuse, uncomplicated: Secondary | ICD-10-CM | POA: Insufficient documentation

## 2018-05-13 DIAGNOSIS — F331 Major depressive disorder, recurrent, moderate: Secondary | ICD-10-CM | POA: Insufficient documentation

## 2018-05-13 DIAGNOSIS — F1120 Opioid dependence, uncomplicated: Secondary | ICD-10-CM | POA: Insufficient documentation

## 2018-05-13 LAB — RAPID URINE DRUG SCREEN, HOSP PERFORMED
Amphetamines: POSITIVE — AB
BENZODIAZEPINES: POSITIVE — AB
Barbiturates: NOT DETECTED
COCAINE: POSITIVE — AB
OPIATES: POSITIVE — AB
TETRAHYDROCANNABINOL: POSITIVE — AB

## 2018-05-13 LAB — CBC
HCT: 44.3 % (ref 36.0–46.0)
HEMOGLOBIN: 14.3 g/dL (ref 12.0–15.0)
MCH: 30.9 pg (ref 26.0–34.0)
MCHC: 32.3 g/dL (ref 30.0–36.0)
MCV: 95.7 fL (ref 80.0–100.0)
Platelets: 328 10*3/uL (ref 150–400)
RBC: 4.63 MIL/uL (ref 3.87–5.11)
RDW: 12.8 % (ref 11.5–15.5)
WBC: 14.7 10*3/uL — ABNORMAL HIGH (ref 4.0–10.5)
nRBC: 0 % (ref 0.0–0.2)

## 2018-05-13 LAB — ETHANOL: Alcohol, Ethyl (B): <10 mg/dL (ref <10)

## 2018-05-13 LAB — I-STAT BETA HCG BLOOD, ED (MC, WL, AP ONLY): I-stat hCG, quantitative: 24.4 m[IU]/mL — ABNORMAL HIGH (ref <5)

## 2018-05-13 LAB — COMPREHENSIVE METABOLIC PANEL
ALT: 23 U/L (ref 0–44)
AST: 20 U/L (ref 15–41)
Albumin: 4.7 g/dL (ref 3.5–5.0)
Alkaline Phosphatase: 60 U/L (ref 38–126)
Anion gap: 10 (ref 5–15)
BUN: 14 mg/dL (ref 6–20)
CALCIUM: 9.6 mg/dL (ref 8.9–10.3)
CHLORIDE: 105 mmol/L (ref 98–111)
CO2: 25 mmol/L (ref 22–32)
CREATININE: 0.67 mg/dL (ref 0.44–1.00)
GFR calc Af Amer: >60 mL/min (ref >60)
GLUCOSE: 56 mg/dL — AB (ref 70–99)
Potassium: 3.4 mmol/L — ABNORMAL LOW (ref 3.5–5.1)
SODIUM: 140 mmol/L (ref 135–145)
TOTAL PROTEIN: 7.6 g/dL (ref 6.5–8.1)
Total Bilirubin: 0.5 mg/dL (ref 0.3–1.2)

## 2018-05-13 LAB — PREGNANCY, URINE: PREG TEST UR: POSITIVE — AB

## 2018-05-13 MED ORDER — MIRTAZAPINE 30 MG PO TABS: 30.0000 mg | ORAL_TABLET | Freq: Every day | ORAL | Status: DC
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
## 2018-05-13 NOTE — ED Notes (Signed)
TTS machine at bedside for consult.  

## 2018-05-13 NOTE — ED Notes (Addendum)
Pt came out of her room, mad that it took "22 minutes" to get a blanket and crackers. Pt stated that is ridiculous and the nurse was just "sitting there talking". Chelsea, RN stated that she was orienting another nurse and explaining work . Pt provided blanket and graham crackers.

## 2018-05-13 NOTE — ED Notes (Signed)
Sedalia Muta (friend) [phone] can get info.

## 2018-05-13 NOTE — ED Notes (Signed)
Patient requesting to take home medications for sleeping; EDP made aware that patient would like to take home medications and advised that home medications are not safe to take at this time. Will continue to monitor patient.

## 2018-05-13 NOTE — ED Notes (Signed)
Patient seen walking out of emergency department by multiple staff members as she wanted her Remeron medication and she stated that she had been waiting too long. Provider aware. Patient seen walking out of department with steady gait in NAD.

## 2018-05-13 NOTE — Progress Notes (Signed)
Nurse, Ladona Ridgel informed of pt disposition.

## 2018-05-13 NOTE — ED Triage Notes (Signed)
Patient wants to be detox from adderall, heroine, cocaine, and alcohol. Patient states she can not live like this anymore.

## 2018-05-13 NOTE — ED Notes (Addendum)
Pt came out of her room again, stating "I need to leave, nothing is being done for me. I asked for remeron a long time ago and its in my bag". Pt informed that order was just put in, and we could go grab it. Pt stated to this RN that she is frustrated and just wants to leave. Pt advised to stay to speak with peer support. Pt stated she did not want to wait any longer. Gait steady, pt A&Ox4. Thayer Ohm PA made aware of pt leaving AMA. Pt encouraged to stay for treatment and medication and declined.

## 2018-05-13 NOTE — ED Notes (Signed)
Bed: [facility] Expected date:  Expected time:  Means of arrival:  Comments: 

## 2018-05-18 NOTE — ED Provider Notes (Signed)
[facility]-EMERGENCY DEPT Provider Note   CSN: [id number] Arrival date & time: 05/13/18  0310    History   Chief Complaint Chief Complaint  Patient presents with  . Drug / Alcohol Assessment    HPI Doris Roberson is a 28 y.o. female.     HPI Patient presents to the emergency department with concerns about drug dependence.  The patient states she would like some help with this.  She states that she has not had any suicidal or homicidal thoughts at this time.  Patient denies any other symptoms.  The patient denies chest pain, shortness of breath, headache,blurred vision, neck pain, fever, cough, weakness, numbness, dizziness, anorexia, edema, abdominal pain, nausea, vomiting, diarrhea, rash, back pain, dysuria, hematemesis, bloody stool, near syncope, or syncope. Past Medical History:  Diagnosis Date  . Alcohol abuse    s/p inpatient rehab at [facility] in 2011, two [place]  . Depression    h/o Tylenol pm overdose, subsequent admision to behavioral health  . Mood disorder [facility])     Patient Active Problem List   Diagnosis Date Noted  . Labor and delivery, indication for care 05/05/2016  . Bacteria in urine 04/14/2016  . Pilonidal cyst 04/08/2016  . Hyperemesis complicating pregnancy, antepartum 03/15/2016  . Rh negative status during pregnancy in second trimester 01/31/2016  . Smoking (tobacco) complicating pregnancy, third trimester 01/31/2016  . Family history of autism 10/22/2015  . First trimester screening 10/22/2015  . Tobacco use in pregnancy, antepartum 10/05/2015  . Alcohol abuse   . Mood disorder (HCC)   . Rash 09/18/2010  . Pregnant state, incidental 08/14/2010  . 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 ALCOHOL ABUSE 03/12/2010  . DEPRESSION 03/12/2010  . WEIGHT LOSS 03/12/2010    Past Surgical History:  Procedure Laterality Date  . ANTERIOR CRUCIATE LIGAMENT REPAIR Left 2008  . CESAREAN SECTION N/A 05/07/2016   Procedure: CESAREAN SECTION;  Surgeon: Christeen Douglas, MD;  Location: [facility] ORS;  Service: Obstetrics;  Laterality: N/A;  Female born @ 29 Apgars: 9/9 Weight:7lb 11oz     OB History    Gravida  2   Para  1   Term  1   Preterm      AB  1   Living        SAB      TAB      Ectopic      Multiple  0   Live Births               Home Medications    Prior to Admission medications   Medication Sig Start Date End Date Taking? Authorizing Provider  fluticasone (FLONASE) 50 MCG/ACT nasal spray Place 2 sprays into both nostrils daily. 03/28/16  Yes Judeth Horn, NP  hydrOXYzine (ATARAX/VISTARIL) 10 MG tablet Take 10 mg by mouth 3 (three) times daily as needed for sleep. 03/26/18  Yes [provider]  mirtazapine (REMERON) 30 MG tablet Take 30 mg by mouth at bedtime. 03/26/18  Yes [provider]  ferrous sulfate 325 (65 FE) MG tablet Take 1 tablet (325 mg total) by mouth daily with breakfast. Patient not taking: Reported on 05/13/2018 05/09/16   Karena Addison, CNM  hydrocortisone (ANUSOL-HC) 25 MG suppository Place 1 suppository (25 mg total) rectally 2 (two) times daily. Patient not taking: Reported on 05/13/2018 04/10/16   Armando Reichert, CNM  ibuprofen (ADVIL,MOTRIN) 600 MG tablet Take 1 tablet (600 mg total) by mouth every 6 (six) hours.  Patient not taking: Reported on 05/13/2018 05/09/16   Karena Addison, CNM  ketorolac (TORADOL) 10 MG tablet Take 1 tablet (10 mg total) by mouth every 8 (eight) hours. Patient not taking: Reported on 05/13/2018 07/21/17   Menshew, Charlesetta Ivory, PA-C  metoCLOPramide (REGLAN) 5 MG tablet Take 1 tablet (5 mg total) by mouth every 8 (eight) hours as needed for nausea or vomiting. Patient not taking: Reported on 05/13/2018 07/21/17   Menshew, Charlesetta Ivory, PA-C  oxyCODONE-acetaminophen (PERCOCET/ROXICET) 5-325 MG tablet Take 1-2 tablets by mouth every 4 (four) hours as needed (pain scale 4-7). Patient not taking: Reported  on 05/13/2018 05/09/16   Karena Addison, CNM  Prenatal Vit-Fe Fumarate-FA (PRENATAL MULTIVITAMIN) TABS tablet Take 1 tablet by mouth daily at 12 noon. Patient not taking: Reported on 05/13/2018 05/10/16   Karena Addison, CNM    Family History Family History  Problem Relation Age of Onset  . Alcohol abuse Other   . Heart disease Father     Social History Social History   Tobacco Use  . Smoking status: Current Every Day Smoker    Packs/day: 0.50  . Smokeless tobacco: Never Used  Substance Use Topics  . Alcohol use: No  . Drug use: No     Allergies   Other and Nitrofurantoin   Review of Systems Review of Systems All other systems negative except as documented in the HPI. All pertinent positives and negatives as reviewed in the HPI.  Physical Exam Updated Vital Signs BP 105/76   Pulse 73   Temp 97.6 F (36.4 C) (Oral)   Resp 14   Ht 5\' 6"  (1.676 m)   Wt 48.6 kg   LMP 05/13/2018   SpO2 100%   BMI 17.30 kg/m   Physical Exam Vitals signs and nursing note reviewed.  Constitutional:      General: She is not in acute distress.    Appearance: She is well-developed.  HENT:     Head: Normocephalic and atraumatic.  Eyes:     Pupils: Pupils are equal, round, and reactive to light.  Neck:     Musculoskeletal: Normal range of motion and neck supple.  Cardiovascular:     Rate and Rhythm: Normal rate and regular rhythm.     Heart sounds: Normal heart sounds. No murmur. No friction rub. No gallop.   Pulmonary:     Effort: Pulmonary effort is normal. No respiratory distress.     Breath sounds: Normal breath sounds. No wheezing.  Abdominal:     General: Bowel sounds are normal. There is no distension.     Palpations: Abdomen is soft.     Tenderness: There is no abdominal tenderness.  Skin:    General: Skin is warm and dry.     Capillary Refill: Capillary refill takes less than 2 seconds.     Findings: No erythema or rash.  Neurological:     Mental Status: She is  alert and oriented to person, place, and time.     Motor: No abnormal muscle tone.     Coordination: Coordination normal.  Psychiatric:        Behavior: Behavior normal.      ED Treatments / Results  Labs (all labs ordered are listed, but only abnormal results are displayed) Labs Reviewed  COMPREHENSIVE METABOLIC PANEL - Abnormal; Notable for the following components:      Result Value   Potassium 3.4 (*)    Glucose, Bld 56 (*)    All  other components within normal limits  CBC - Abnormal; Notable for the following components:   WBC 14.7 (*)    All other components within normal limits  RAPID URINE DRUG SCREEN, HOSP PERFORMED - Abnormal; Notable for the following components:   Opiates POSITIVE (*)    Cocaine POSITIVE (*)    Benzodiazepines POSITIVE (*)    Amphetamines POSITIVE (*)    Tetrahydrocannabinol POSITIVE (*)    All other components within normal limits  PREGNANCY, URINE - Abnormal; Notable for the following components:   Preg Test, Ur POSITIVE (*)    All other components within normal limits  I-STAT BETA HCG BLOOD, ED ([facility], [facility], [facility] ONLY) - Abnormal; Notable for the following components:   I-stat hCG, quantitative 24.4 (*)    All other components within normal limits  ETHANOL    EKG None  Radiology No results found.  Procedures Procedures (including critical care time)  Medications Ordered in ED Medications - No data to display   Initial Impression / Assessment and Plan / ED Course  I have reviewed the triage vital signs and the nursing notes.  Pertinent labs & imaging results that were available during my care of the patient were reviewed by me and considered in my medical decision making (see chart for details).        He apparently left after being angry with the staff.  Patient was waiting for the peer support personnel to help her with assistance in finding drug treatment facilities. Final Clinical Impressions(s) / ED Diagnoses   Final  diagnoses:  Polysubstance abuse [facility](HCC)    ED Discharge Orders    None       Charlestine NightLawyer, Cheral Cappucci, PA-C 05/18/18 1531    Tegeler, Canary Brimhristopher J, MD 05/18/18 [phone]

## 2018-06-30 ENCOUNTER — Encounter (HOSPITAL_COMMUNITY): Payer: Self-pay

## 2018-06-30 ENCOUNTER — Emergency Department (HOSPITAL_COMMUNITY)
Admission: EM | Admit: 2018-06-30 | Discharge: 2018-07-01 | Disposition: A | Discharge status: Home / Self Care | Payer: Medicaid Other | Attending: Emergency Medicine | Admitting: Emergency Medicine

## 2018-06-30 ENCOUNTER — Other Ambulatory Visit: Payer: Self-pay

## 2018-06-30 DIAGNOSIS — N12 Tubulo-interstitial nephritis, not specified as acute or chronic: Secondary | ICD-10-CM

## 2018-06-30 DIAGNOSIS — F17210 Nicotine dependence, cigarettes, uncomplicated: Secondary | ICD-10-CM | POA: Insufficient documentation

## 2018-06-30 DIAGNOSIS — R1012 Left upper quadrant pain: Secondary | ICD-10-CM | POA: Diagnosis not present

## 2018-06-30 DIAGNOSIS — Z79899 Other long term (current) drug therapy: Secondary | ICD-10-CM | POA: Diagnosis not present

## 2018-06-30 DIAGNOSIS — R109 Unspecified abdominal pain: Secondary | ICD-10-CM | POA: Diagnosis present

## 2018-06-30 MED ORDER — KETOROLAC TROMETHAMINE 15 MG/ML IJ SOLN
7.5000 mg | Freq: Once | INTRAMUSCULAR | Status: AC
  Administered 2018-07-01: 7.5 mg via INTRAVENOUS
  Filled 2018-06-30: qty 1

## 2018-06-30 MED ORDER — ONDANSETRON HCL 4 MG/2ML IJ SOLN
4.0000 mg | Freq: Once | INTRAMUSCULAR | Status: AC
  Administered 2018-07-01: 4 mg via INTRAVENOUS
  Filled 2018-06-30: qty 2

## 2018-06-30 MED ORDER — ALUM & MAG HYDROXIDE-SIMETH 200-200-20 MG/5ML PO SUSP
30.0000 mL | Freq: Once | ORAL | Status: AC
  Administered 2018-07-01: 30 mL via ORAL
  Filled 2018-06-30: qty 30

## 2018-06-30 MED ORDER — LIDOCAINE VISCOUS HCL 2 % MT SOLN
15.0000 mL | Freq: Once | OROMUCOSAL | Status: AC
  Administered 2018-07-01: 15 mL via ORAL
  Filled 2018-06-30: qty 15

## 2018-06-30 NOTE — ED Triage Notes (Signed)
Pt arrived stating she las left flank pain that started last night. Pt stating she has some nausea from her pain. Pt states she has had some urinary frequency but it does not feel like a UTI she has had in the past.

## 2018-06-30 NOTE — ED Provider Notes (Signed)
[facility]-EMERGENCY DEPT Provider Note  CSN: [id number] Arrival date & time: 06/30/18 2313  Chief Complaint(s) Flank Pain  HPI Doris Roberson is a 28 y.o. female   The history is provided by the patient.  Abdominal Pain  Pain location:  LUQ Pain quality: pressure, sharp and stabbing   Pain radiates to:  L flank and back Pain severity:  Severe Onset quality:  Gradual Duration:  30 hours Timing:  Constant Progression:  Worsening Chronicity:  New Context: not alcohol use (denies; though h/o noted), not medication withdrawal, not recent illness, not sick contacts and not suspicious food intake   Relieved by:  Heat Worsened by:  Coughing, movement and deep breathing Associated symptoms: nausea and vaginal bleeding (on her period)   Associated symptoms: no chest pain, no chills, no constipation, no cough, no dysuria, no fever and no vomiting   Risk factors comment:  H/o polysubstance abuse. No recent IVDU per pt.   Past Medical History Past Medical History:  Diagnosis Date   Alcohol abuse    s/p inpatient rehab at [facility] in 2011, two DUI's   Depression    h/o Tylenol pm overdose, subsequent admision to behavioral health   Mood disorder [facility])    Patient Active Problem List   Diagnosis Date Noted   Labor and delivery, indication for care 05/05/2016   Bacteria in urine 04/14/2016   Pilonidal cyst 04/08/2016   Hyperemesis complicating pregnancy, antepartum 03/15/2016   Rh negative status during pregnancy in second trimester 01/31/2016   Smoking (tobacco) complicating pregnancy, third trimester 01/31/2016   Family history of autism 10/22/2015   First trimester screening 10/22/2015   Tobacco use in pregnancy, antepartum 10/05/2015   Alcohol abuse    Mood disorder (HCC)    Rash 09/18/2010   Pregnant state, incidental 08/14/2010   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 Authorizing Provider  oxymetazoline (AFRIN) 0.05 % nasal spray Place 1 spray into both nostrils 2 (two) times daily as needed for congestion.   Yes [provider]  cephALEXin (KEFLEX) 500 MG capsule Take 1 capsule (500 mg total) by mouth 4 (four) times daily. 07/01/18   Nira Conn, MD  ferrous sulfate 325 (65 FE) MG tablet Take 1 tablet (325 mg total) by mouth daily with breakfast. Patient not taking: Reported on 05/13/2018 05/09/16   Karena Addison, CNM  fluticasone (FLONASE) 50 MCG/ACT nasal spray Place 2 sprays into both nostrils daily. Patient not taking: Reported on 07/01/2018 03/28/16   Judeth Horn, NP  hydrocortisone (ANUSOL-HC) 25 MG suppository Place 1 suppository (25 mg total) rectally 2 (two) times daily. Patient not taking: Reported on 05/13/2018 04/10/16   Armando Reichert, CNM  ibuprofen (ADVIL,MOTRIN) 600 MG tablet Take 1 tablet (600 mg total) by mouth every 6 (six) hours. Patient not taking: Reported on 05/13/2018 05/09/16   Karena Addison, CNM  ketorolac (TORADOL) 10 MG tablet Take 1 tablet (10 mg total) by mouth every 8 (eight) hours. Patient not taking: Reported on 05/13/2018 07/21/17   Menshew, Charlesetta Ivory, PA-C  metoCLOPramide (REGLAN) 5 MG tablet Take 1 tablet (5 mg total) by mouth every 8 (eight) hours as needed for nausea or vomiting. Patient not taking: Reported on 05/13/2018 07/21/17   Menshew, Charlesetta Ivory, PA-C  ondansetron (ZOFRAN ODT) 4  MG disintegrating tablet Take 1 tablet (4 mg total) by mouth every 8 (eight) hours as needed for up to 3 days for nausea or vomiting. 07/01/18 07/04/18  Nira Conn, MD  oxyCODONE-acetaminophen (PERCOCET/ROXICET) 5-325 MG tablet Take 1-2 tablets by mouth every 4 (four) hours as needed (pain scale 4-7). Patient not taking: Reported on 05/13/2018 05/09/16   Karena Addison,  CNM  Prenatal Vit-Fe Fumarate-FA (PRENATAL MULTIVITAMIN) TABS tablet Take 1 tablet by mouth daily at 12 noon. Patient not taking: Reported on 05/13/2018 05/10/16   Karena Addison, CNM                                                                                                                                    Past Surgical History Past Surgical History:  Procedure Laterality Date   ANTERIOR CRUCIATE LIGAMENT REPAIR Left 2008   CESAREAN SECTION N/A 05/07/2016   Procedure: CESAREAN SECTION;  Surgeon: Christeen Douglas, MD;  Location: [facility] ORS;  Service: Obstetrics;  Laterality: N/A;  Female born @ 49 Apgars: 9/9 Weight:7lb 11oz   Family History Family History  Problem Relation Age of Onset   Alcohol abuse Other    Heart disease Father     Social History Social History   Tobacco Use   Smoking status: Current Every Day Smoker    Packs/day: 0.50   Smokeless tobacco: Never Used  Substance Use Topics   Alcohol use: No   Drug use: No   Allergies Other and Nitrofurantoin  Review of Systems Review of Systems  Constitutional: Negative for chills and fever.  Respiratory: Negative for cough.   Cardiovascular: Negative for chest pain.  Gastrointestinal: Positive for abdominal pain and nausea. Negative for constipation and vomiting.  Genitourinary: Positive for frequency and vaginal bleeding (on her period). Negative for dysuria.   All other systems are reviewed and are negative for acute change except as noted in the HPI  Physical Exam Vital Signs  I have reviewed the triage vital signs BP (!) 145/111    Pulse 95    Temp 97.6 F (36.4 C) (Oral)    Ht  (1.702 m)    Wt 49.9 kg    LMP 06/30/2018    SpO2 (!) 88%    BMI 17.23 kg/m   Physical Exam Vitals signs reviewed.  Constitutional:      General: She is not in acute distress.    Appearance: She is well-developed. She is not diaphoretic.  HENT:     Head: Normocephalic and atraumatic.     Nose: Nose normal.    Eyes:     General: No scleral icterus.       Right eye: No discharge.        Left eye: No discharge.     Conjunctiva/sclera: Conjunctivae normal.     Pupils: Pupils are equal, round, and reactive to light.  Neck:     Musculoskeletal: Normal range of  motion and neck supple.  Cardiovascular:     Rate and Rhythm: Normal rate and regular rhythm.     Heart sounds: No murmur. No friction rub. No gallop.   Pulmonary:     Effort: Pulmonary effort is normal. No respiratory distress.     Breath sounds: Normal breath sounds. No stridor. No rales.  Abdominal:     General: There is no distension.     Palpations: Abdomen is soft.     Tenderness: There is abdominal tenderness in the left upper quadrant. There is left CVA tenderness. There is no guarding or rebound. Negative signs include Murphy's sign and McBurney's sign.  Musculoskeletal:        General: No tenderness.  Skin:    General: Skin is warm and dry.     Findings: No erythema or rash.  Neurological:     Mental Status: She is alert and oriented to person, place, and time.     ED Results and Treatments Labs (all labs ordered are listed, but only abnormal results are displayed) Labs Reviewed  URINALYSIS, ROUTINE W REFLEX MICROSCOPIC - Abnormal; Notable for the following components:      Result Value   APPearance HAZY (*)    Hgb urine dipstick MODERATE (*)    Leukocytes,Ua SMALL (*)    WBC, UA >50 (*)    Bacteria, UA RARE (*)    All other components within normal limits  CBC WITH DIFFERENTIAL/PLATELET - Abnormal; Notable for the following components:   WBC 11.3 (*)    Neutro Abs 8.3 (*)    All other components within normal limits  COMPREHENSIVE METABOLIC PANEL  LIPASE, BLOOD  POC URINE PREG, ED                                                                                                                         EKG  EKG Interpretation  Date/Time:    Ventricular Rate:    PR Interval:    QRS Duration:   QT Interval:     QTC Calculation:   R Axis:     Text Interpretation:        Radiology Dg Chest 2 View  Result Date: 07/01/2018 CLINICAL DATA:  28 year old female with left sided chest pain. EXAM: CHEST - 2 VIEW COMPARISON:  Chest radiograph dated 07/29/2009 FINDINGS: The heart size and mediastinal contours are within normal limits. Both lungs are clear. The visualized skeletal structures are unremarkable. IMPRESSION: No active cardiopulmonary disease. Electronically Signed   By: Elgie Collard M.D.   On: 07/01/2018 00:26   Pertinent labs & imaging results that were available during my care of the patient were reviewed by me and considered in my medical decision making (see chart for details).  Medications Ordered in ED Medications  ketorolac (TORADOL) 15 MG/ML injection (has no administration in time range)  ondansetron (ZOFRAN) injection 4 mg (4 mg Intravenous Given 07/01/18 0015)  ketorolac (TORADOL) 15 MG/ML injection 7.5 mg (7.5 mg Intravenous Given  07/01/18 0015)  alum & mag hydroxide-simeth (MAALOX/MYLANTA) 200-200-20 MG/5ML suspension 30 mL (30 mLs Oral Given 07/01/18 0013)    And  lidocaine (XYLOCAINE) 2 % viscous mouth solution 15 mL (15 mLs Oral Given 07/01/18 0014)  cephALEXin (KEFLEX) capsule 500 mg (500 mg Oral Given 07/01/18 0139)                                                                                                                                    Procedures Procedures  (including critical care time)  Medical Decision Making / ED Course I have reviewed the nursing notes for this encounter and the patient's prior records (if available in EHR or on provided paperwork).    Left upper quadrant/left flank pain gradually worsening over the past several days.  Patient is afebrile with stable vital signs.  Exam with mild left upper quadrant tenderness and left CVA tenderness.  No evidence of peritonitis.  Labs with improving leukocytosis without anemia.  No significant electrolyte  derangements or renal sufficiency.  No evidence of bili obstruction or pancreatitis.  Given patient's history of IV drug use, chest x-ray obtain and no evidence of pneumonia.   Pain is not suspicious for epidural abscess or psoas abscess.  UA suspicious for possible urinary tract infection.  Will treat for pyelonephritis.  Presentation also suspicious for gastritis.  Patient given Toradol and GI cocktail resulting in significant improvement in her symptoms.  She was able to tolerate oral intake and was asking for food.  The patient appears reasonably screened and/or stabilized for discharge and I doubt any other medical condition or other [facility] requiring further screening, evaluation, or treatment in the ED at this time prior to discharge.  The patient is safe for discharge with strict return precautions.   Final Clinical Impression(s) / ED Diagnoses Final diagnoses:  LUQ pain  Pyelonephritis    Disposition: Discharge  Condition: Good  I have discussed the results, Dx and Tx plan with the patient who expressed understanding and agree(s) with the plan. Discharge instructions discussed at great length. The patient was given strict return precautions who verbalized understanding of the instructions. No further questions at time of discharge.    ED Discharge Orders         Ordered    ondansetron (ZOFRAN ODT) 4 MG disintegrating tablet  Every 8 hours PRN     07/01/18 0115    cephALEXin (KEFLEX) 500 MG capsule  4 times daily     07/01/18 0115           Follow Up: Primary care provider  Schedule an appointment as soon as possible for a visit  If you do not have a primary care physician, contact [facility] at [phone] for referral     This chart was dictated using voice recognition software.  Despite best efforts to proofread,  errors can occur which can change the documentation meaning.   Drema Pry  Domingo Cocking, MD 07/01/18 [phone]

## 2018-07-01 ENCOUNTER — Emergency Department (HOSPITAL_COMMUNITY): Payer: Medicaid Other

## 2018-07-01 LAB — CBC WITH DIFFERENTIAL/PLATELET
Abs Immature Granulocytes: 0.04 10*3/uL (ref 0.00–0.07)
Basophils Absolute: 0.1 10*3/uL (ref 0.0–0.1)
Basophils Relative: 1 %
Eosinophils Absolute: 0 10*3/uL (ref 0.0–0.5)
Eosinophils Relative: 0 %
HCT: 45.3 % (ref 36.0–46.0)
Hemoglobin: 15 g/dL (ref 12.0–15.0)
Immature Granulocytes: 0 %
Lymphocytes Relative: 17 %
Lymphs Abs: 1.9 10*3/uL (ref 0.7–4.0)
MCH: 30.3 pg (ref 26.0–34.0)
MCHC: 33.1 g/dL (ref 30.0–36.0)
MCV: 91.5 fL (ref 80.0–100.0)
Monocytes Absolute: 0.9 10*3/uL (ref 0.1–1.0)
Monocytes Relative: 8 %
Neutro Abs: 8.3 10*3/uL — ABNORMAL HIGH (ref 1.7–7.7)
Neutrophils Relative %: 74 %
Platelets: 294 10*3/uL (ref 150–400)
RBC: 4.95 MIL/uL (ref 3.87–5.11)
RDW: 12.4 % (ref 11.5–15.5)
WBC: 11.3 10*3/uL — ABNORMAL HIGH (ref 4.0–10.5)
nRBC: 0 % (ref 0.0–0.2)

## 2018-07-01 LAB — URINALYSIS, ROUTINE W REFLEX MICROSCOPIC
Bilirubin Urine: NEGATIVE
Glucose, UA: NEGATIVE mg/dL
Ketones, ur: NEGATIVE mg/dL
Nitrite: NEGATIVE
Protein, ur: NEGATIVE mg/dL
Specific Gravity, Urine: 1.013 (ref 1.005–1.030)
WBC, UA: >50 WBC/hpf — ABNORMAL HIGH (ref 0–5)
pH: 6 (ref 5.0–8.0)

## 2018-07-01 LAB — COMPREHENSIVE METABOLIC PANEL
ALT: 19 U/L (ref 0–44)
AST: 22 U/L (ref 15–41)
Albumin: 4 g/dL (ref 3.5–5.0)
Alkaline Phosphatase: 88 U/L (ref 38–126)
Anion gap: 9 (ref 5–15)
BUN: 8 mg/dL (ref 6–20)
CO2: 24 mmol/L (ref 22–32)
Calcium: 9 mg/dL (ref 8.9–10.3)
Chloride: 102 mmol/L (ref 98–111)
Creatinine, Ser: 0.8 mg/dL (ref 0.44–1.00)
GFR calc Af Amer: >60 mL/min (ref >60)
GFR calc non Af Amer: >60 mL/min (ref >60)
Glucose, Bld: 85 mg/dL (ref 70–99)
Potassium: 3.7 mmol/L (ref 3.5–5.1)
Sodium: 135 mmol/L (ref 135–145)
Total Bilirubin: 0.5 mg/dL (ref 0.3–1.2)
Total Protein: 7.4 g/dL (ref 6.5–8.1)

## 2018-07-01 LAB — LIPASE, BLOOD: Lipase: 23 U/L (ref 11–51)

## 2018-07-01 LAB — POC URINE PREG, ED: Preg Test, Ur: NEGATIVE

## 2018-07-01 MED ORDER — ONDANSETRON 4 MG PO TBDP: 4.0000 mg | ORAL_TABLET | Freq: Three times a day (TID) | ORAL | 0 refills | Status: AC | PRN

## 2018-07-01 MED ORDER — CEPHALEXIN 500 MG PO CAPS
500.0000 mg | ORAL_CAPSULE | Freq: Once | ORAL | Status: AC
  Administered 2018-07-01: 500 mg via ORAL
  Filled 2018-07-01: qty 1

## 2018-07-01 MED ORDER — CEPHALEXIN 500 MG PO CAPS: 500.0000 mg | ORAL_CAPSULE | Freq: Four times a day (QID) | ORAL | 0 refills | Status: DC

## 2018-07-01 MED ORDER — KETOROLAC TROMETHAMINE 15 MG/ML IJ SOLN
INTRAMUSCULAR | Status: AC
  Filled 2018-07-01: qty 1

## 2018-10-05 ENCOUNTER — Encounter: Payer: Self-pay | Admitting: Emergency Medicine

## 2018-10-05 ENCOUNTER — Emergency Department
Admission: EM | Admit: 2018-10-05 | Discharge: 2018-10-06 | Disposition: A | Discharge status: Home / Self Care | Payer: Medicaid Other | Attending: Emergency Medicine | Admitting: Emergency Medicine

## 2018-10-05 ENCOUNTER — Other Ambulatory Visit: Payer: Self-pay

## 2018-10-05 DIAGNOSIS — F333 Major depressive disorder, recurrent, severe with psychotic symptoms: Secondary | ICD-10-CM | POA: Diagnosis not present

## 2018-10-05 DIAGNOSIS — F17210 Nicotine dependence, cigarettes, uncomplicated: Secondary | ICD-10-CM | POA: Insufficient documentation

## 2018-10-05 DIAGNOSIS — R45851 Suicidal ideations: Secondary | ICD-10-CM | POA: Diagnosis not present

## 2018-10-05 DIAGNOSIS — F329 Major depressive disorder, single episode, unspecified: Secondary | ICD-10-CM | POA: Insufficient documentation

## 2018-10-05 DIAGNOSIS — F32A Depression, unspecified: Secondary | ICD-10-CM

## 2018-10-05 DIAGNOSIS — F315 Bipolar disorder, current episode depressed, severe, with psychotic features: Secondary | ICD-10-CM | POA: Diagnosis present

## 2018-10-05 DIAGNOSIS — F600 Paranoid personality disorder: Secondary | ICD-10-CM | POA: Diagnosis not present

## 2018-10-05 DIAGNOSIS — Z046 Encounter for general psychiatric examination, requested by authority: Secondary | ICD-10-CM | POA: Diagnosis present

## 2018-10-05 DIAGNOSIS — F1910 Other psychoactive substance abuse, uncomplicated: Secondary | ICD-10-CM | POA: Insufficient documentation

## 2018-10-05 DIAGNOSIS — F3189 Other bipolar disorder: Secondary | ICD-10-CM | POA: Diagnosis not present

## 2018-10-05 DIAGNOSIS — F309 Manic episode, unspecified: Secondary | ICD-10-CM | POA: Diagnosis not present

## 2018-10-05 LAB — CBC WITH DIFFERENTIAL/PLATELET
Abs Immature Granulocytes: 0.01 10*3/uL (ref 0.00–0.07)
Basophils Absolute: 0.1 10*3/uL (ref 0.0–0.1)
Basophils Relative: 1 %
Eosinophils Absolute: 0.1 10*3/uL (ref 0.0–0.5)
Eosinophils Relative: 1 %
HCT: 40.2 % (ref 36.0–46.0)
Hemoglobin: 13.9 g/dL (ref 12.0–15.0)
Immature Granulocytes: 0 %
Lymphocytes Relative: 54 %
Lymphs Abs: 5.2 10*3/uL — ABNORMAL HIGH (ref 0.7–4.0)
MCH: 29.2 pg (ref 26.0–34.0)
MCHC: 34.6 g/dL (ref 30.0–36.0)
MCV: 84.5 fL (ref 80.0–100.0)
Monocytes Absolute: 0.8 10*3/uL (ref 0.1–1.0)
Monocytes Relative: 9 %
Neutro Abs: 3.3 10*3/uL (ref 1.7–7.7)
Neutrophils Relative %: 35 %
Platelets: 315 10*3/uL (ref 150–400)
RBC: 4.76 MIL/uL (ref 3.87–5.11)
RDW: 13.8 % (ref 11.5–15.5)
Smear Review: NORMAL
WBC: 9.5 10*3/uL (ref 4.0–10.5)
nRBC: 0 % (ref 0.0–0.2)

## 2018-10-05 LAB — COMPREHENSIVE METABOLIC PANEL
ALT: 25 U/L (ref 0–44)
AST: 22 U/L (ref 15–41)
Albumin: 4.8 g/dL (ref 3.5–5.0)
Alkaline Phosphatase: 91 U/L (ref 38–126)
Anion gap: 13 (ref 5–15)
BUN: 21 mg/dL — ABNORMAL HIGH (ref 6–20)
CO2: 20 mmol/L — ABNORMAL LOW (ref 22–32)
Calcium: 9.9 mg/dL (ref 8.9–10.3)
Chloride: 108 mmol/L (ref 98–111)
Creatinine, Ser: 1.17 mg/dL — ABNORMAL HIGH (ref 0.44–1.00)
GFR calc Af Amer: >60 mL/min (ref >60)
GFR calc non Af Amer: >60 mL/min (ref >60)
Glucose, Bld: 98 mg/dL (ref 70–99)
Potassium: 3.8 mmol/L (ref 3.5–5.1)
Sodium: 141 mmol/L (ref 135–145)
Total Bilirubin: 0.5 mg/dL (ref 0.3–1.2)
Total Protein: 7.8 g/dL (ref 6.5–8.1)

## 2018-10-05 LAB — SALICYLATE LEVEL: Salicylate Lvl: <7 mg/dL (ref 2.8–30.0)

## 2018-10-05 LAB — ACETAMINOPHEN LEVEL: Acetaminophen (Tylenol), Serum: <10 ug/mL — ABNORMAL LOW (ref 10–30)

## 2018-10-05 LAB — ETHANOL: Alcohol, Ethyl (B): <10 mg/dL (ref <10)

## 2018-10-05 MED ORDER — LORAZEPAM 2 MG/ML IJ SOLN
2.0000 mg | Freq: Once | INTRAMUSCULAR | Status: AC
  Administered 2018-10-05: 2 mg via INTRAMUSCULAR
  Filled 2018-10-05: qty 1

## 2018-10-05 MED ORDER — HALOPERIDOL LACTATE 5 MG/ML IJ SOLN
5.0000 mg | Freq: Once | INTRAMUSCULAR | Status: AC
  Administered 2018-10-05: 5 mg via INTRAMUSCULAR
  Filled 2018-10-05: qty 1

## 2018-10-05 MED ORDER — HALOPERIDOL 5 MG PO TABS
5.0000 mg | ORAL_TABLET | Freq: Once | ORAL | Status: AC
  Administered 2018-10-05: 5 mg via ORAL
  Filled 2018-10-05: qty 1

## 2018-10-05 MED ORDER — LORAZEPAM 2 MG PO TABS
2.0000 mg | ORAL_TABLET | Freq: Once | ORAL | Status: AC
  Administered 2018-10-05: 2 mg via ORAL
  Filled 2018-10-05: qty 1
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
## 2018-10-05 NOTE — ED Notes (Signed)
Pt was given specimen cup since she went to [facility]. Pt peed but did not collect specimen. Advised pt we need a urine sample next time she goes.

## 2018-10-05 NOTE — ED Notes (Signed)
Pt speaking with NP at this time. Maintained on 15 minute checks.

## 2018-10-05 NOTE — ED Notes (Addendum)
pts belongings include; hair ties (10), black sandals, black capri, short sleeve shirt, earrings (3), yellow metal necklace, pink panties, and black bra. Belongings secured in labeled belonging bag and pt given burgundy scrub

## 2018-10-05 NOTE — ED Notes (Signed)
Pt given 2 mg PO Ativan for anxiety and restlessness per EDP.

## 2018-10-05 NOTE — Consult Note (Signed)
[facility]   Reason for Consult: Psychotic behaviors Referring Physician: Dr. Juliette AlcideMelinda Patient Identification: Doris Roberson MRN:  [id number] Principal Diagnosis: <principal problem not specified> Diagnosis:  Active Problems:   Bipolar I disorder, most recent episode (or current) depressed, severe, specified as with psychotic behavior (HCC)   Total Time spent with patient: 1 hour  Subjective: Nonsensical subjective data from patient.  Doris Roberson is a 28 y.o. female patient [facility] via law enforcement under involuntary commitment status (IVC).  During the patient assessment she is extremely psychotic, crying, paranoia and voicing she is going to leave.  The patient is unable to discuss the situation that led her to come in to the hospital.  On several occasions she had to be redirected to stay in her room and sit on her bed.  The patient was medicated with oral Ativan prior to this provider assessing her.  She remains manic and unable to be redirected at times. She admits to having previous hospitalizations and previous suicidal attempts bu over-dosing on pills. The patient was seen face-to-face by this provider; chart reviewed and consulted with Dr. Juliette AlcideMelinda on 10/05/2018 due to the care of the patient. It was discussed with the provider that the patient does meet criteria to be admitted to the inpatient unit.  On evaluation the patient is alert and oriented x 1-2, agitated, uncooperative, and mood-congruent with affect. The patient does appear to be responding to internal and external stimuli.  By her increase concerned as to her 28-year-old being okay and safe.  The patient did discuss that her son was with her parents today.  But does not voice that he has been with her parents for 7 months. She is presenting with delusional thinking. The patient denies auditory or visual hallucinations. The patient denies any suicidal, homicidal, or self-harm  ideations. The patient is presenting with psychotic and paranoid behaviors. During an encounter with the patient, she was not able to answer questions appropriately.  She is extremely manic and would divert from topics or situations to the next.  Her answers to questions are nonsensical. Collateral was obtained by dad who expresses concerns for patient's chronic substance used disorder. Mr. Richardson LandryCraig Saephanh [phone]) mom is Doris Roberson is [phone]): Who stated, today he received a call from the patient around 4 PM.  He stated she was crying, very manic and making no sense on what she was trying to say to him.  He voiced that the patient and her boyfriend has been using substances for many years. The patient has 772-1/19-year-old son that Mr. Doris Roberson and his wife currently have custody of.  He discussed that the patient's son has been in their care for 7 months and is doing well.  He states he wants his daughter to get the care for her substance use along with her seeking psychiatric care.  Plan: The patient is a safety risk to self and does require psychiatric inpatient admission for stabilization and treatment.  HPI: Per Dr. Juliette AlcideMelinda: Doris PeaLeeanna R Roberson is a 28 y.o. female  comes in under commitment for apparent suicidal behavior.  Patient herself denies suicidal behavior or ideation to the nurse but tells me that she is worthless a bad person.  She is depressed and feels bad.  Aside from this she is not making much sense.  Her thoughts are very disorganized and she does not communicate effectively with me.  It is almost like she is having flight of ideas  although I do not believe she is manic.  More likely she is intoxicated.  She is talking about using drugs.  She had some water when she was restrained.  Past Psychiatric History: Alcohol abuse Depression Mood disorder (Hand)  Risk to Self:  Yes Risk to Others:  No Prior Inpatient Therapy:  Yes Prior Outpatient Therapy:  Yes  Past Medical  History:  Past Medical History:  Diagnosis Date  . Alcohol abuse    s/p inpatient rehab at [facility] in 2011, two [place]  . Depression    h/o Tylenol pm overdose, subsequent admision to behavioral health  . Mood disorder [facility])     Past Surgical History:  Procedure Laterality Date  . ANTERIOR CRUCIATE LIGAMENT REPAIR Left 2008  . CESAREAN SECTION N/A 05/07/2016   Procedure: CESAREAN SECTION;  Surgeon: Benjaman Kindler, MD;  Location: [facility] ORS;  Service: Obstetrics;  Laterality: N/A;  Female born @ 24 Apgars: 9/9 Weight:7lb 11oz   Family History:  Family History  Problem Relation Age of Onset  . Alcohol abuse Other   . Heart disease Father    Family Psychiatric  History: Yes Social History:  Social History   Substance and Sexual Activity  Alcohol Use No     Social History   Substance and Sexual Activity  Drug Use No    Social History   Socioeconomic History  . Marital status: Single    Spouse name: Not on file  . Number of children: Not on file  . Years of education: Not on file  . Highest education level: Not on file  Occupational History  . Not on file  Social Needs  . Financial resource strain: Not on file  . Food insecurity    Worry: Not on file    Inability: Not on file  . Transportation needs    Medical: Not on file    Non-medical: Not on file  Tobacco Use  . Smoking status: Current Every Day Smoker    Packs/day: 0.50  . Smokeless tobacco: Never Used  Substance and Sexual Activity  . Alcohol use: No  . Drug use: No  . Sexual activity: Yes  Lifestyle  . Physical activity    Days per week: Not on file    Minutes per session: Not on file  . Stress: Not on file  Relationships  . Social Herbalist on phone: Not on file    Gets together: Not on file    Attends religious service: Not on file    Active member of club or organization: Not on file    Attends meetings of clubs or organizations: Not on file    Relationship status: Not on  file  Other Topics Concern  . Not on file  Social History Narrative  . Not on file   Additional Social History:    Allergies:   Allergies  Allergen Reactions  . Other Other (See Comments)    An abx, doesn't remember name. Stomach upset   . Nitrofurantoin Rash    Labs:  Results for orders placed or performed during the hospital encounter of 10/05/18 (from the past 48 hour(s))  CBC with Differential     Status: Abnormal   Collection Time: 10/05/18  7:24 PM  Result Value Ref Range   WBC 9.5 4.0 - 10.5 K/uL   RBC 4.76 3.87 - 5.11 MIL/uL   Hemoglobin 13.9 12.0 - 15.0 g/dL   HCT 40.2 36.0 - 46.0 %   MCV 84.5  80.0 - 100.0 fL   MCH 29.2 26.0 - 34.0 pg   MCHC 34.6 30.0 - 36.0 g/dL   RDW 16.113.8 09.611.5 - 04.515.5 %   Platelets 315 150 - 400 K/uL   nRBC 0.0 0.0 - 0.2 %   Neutrophils Relative % 35 %   Neutro Abs 3.3 1.7 - 7.7 K/uL   Lymphocytes Relative 54 %   Lymphs Abs 5.2 (H) 0.7 - 4.0 K/uL   Monocytes Relative 9 %   Monocytes Absolute 0.8 0.1 - 1.0 K/uL   Eosinophils Relative 1 %   Eosinophils Absolute 0.1 0.0 - 0.5 K/uL   Basophils Relative 1 %   Basophils Absolute 0.1 0.0 - 0.1 K/uL   WBC Morphology MORPHOLOGY UNREMARKABLE    RBC Morphology MORPHOLOGY UNREMARKABLE    Smear Review Normal platelet morphology    Immature Granulocytes 0 %   Abs Immature Granulocytes 0.01 0.00 - 0.07 K/uL    Comment: Performed at [facility], [address] [place], [place] [address]  Comprehensive metabolic panel     Status: Abnormal   Collection Time: 10/05/18  7:24 PM  Result Value Ref Range   Sodium 141 135 - 145 mmol/L   Potassium 3.8 3.5 - 5.1 mmol/L   Chloride 108 98 - 111 mmol/L   CO2 20 (L) 22 - 32 mmol/L   Glucose, Bld 98 70 - 99 mg/dL   BUN 21 (H) 6 - 20 mg/dL   Creatinine, Ser 1.911.17 (H) 0.44 - 1.00 mg/dL   Calcium 9.9 8.9 - 47.810.3 mg/dL   Total Protein 7.8 6.5 - 8.1 g/dL   Albumin 4.8 3.5 - 5.0 g/dL   AST 22 15 - 41 U/L   ALT 25 0 - 44 U/L   Alkaline Phosphatase 91 38  - 126 U/L   Total Bilirubin 0.5 0.3 - 1.2 mg/dL   GFR calc non Af Amer >60 >60 mL/min   GFR calc Af Amer >60 >60 mL/min   Anion gap 13 5 - 15    Comment: Performed at [facility], [address] [place], [place] [address]  Acetaminophen level     Status: Abnormal   Collection Time: 10/05/18  8:25 PM  Result Value Ref Range   Acetaminophen (Tylenol), Serum <10 (L) 10 - 30 ug/mL    Comment: (NOTE) Therapeutic concentrations vary significantly. A range of 10-30 ug/mL  may be an effective concentration for many patients. However, some  are best treated at concentrations outside of this range. Acetaminophen concentrations >150 ug/mL at 4 hours after ingestion  and >50 ug/mL at 12 hours after ingestion are often associated with  toxic reactions. Performed at [facility], [address] [place], [place] [address]   Ethanol     Status: None   Collection Time: 10/05/18  8:25 PM  Result Value Ref Range   Alcohol, Ethyl (B) <10 <10 mg/dL    Comment: (NOTE) Lowest detectable limit for serum alcohol is 10 mg/dL. For medical purposes only. Performed at [facility], [address] [place], [place] [address]   Salicylate level     Status: None   Collection Time: 10/05/18  8:25 PM  Result Value Ref Range   Salicylate Lvl <7.0 2.8 - 30.0 mg/dL    Comment: Performed at [facility], [address] [place], [place] [address]    No current facility-administered medications for this encounter.    Current Outpatient Medications  Medication Sig Dispense Refill  . buPROPion [facility](WELLBUTRIN  XL) 150 MG 24 hr tablet Take 150 mg by mouth daily.    . mirtazapine (REMERON) 15 MG tablet Take 15 mg by mouth at bedtime.    Marland Kitchen. QUEtiapine (SEROQUEL) 100 MG tablet Take 100 mg by mouth at bedtime.    . traZODone (DESYREL) 50 MG tablet Take 50 mg by mouth at bedtime.    . cephALEXin (KEFLEX) 500 MG capsule Take 1 capsule (500 mg total) by mouth 4 (four) times daily.  (Patient not taking: Reported on 10/05/2018) 20 capsule 0  . ferrous sulfate 325 (65 FE) MG tablet Take 1 tablet (325 mg total) by mouth daily with breakfast. (Patient not taking: Reported on 05/13/2018) 30 tablet 3  . fluticasone (FLONASE) 50 MCG/ACT nasal spray Place 2 sprays into both nostrils daily. (Patient not taking: Reported on 07/01/2018) 16 g 0  . hydrocortisone (ANUSOL-HC) 25 MG suppository Place 1 suppository (25 mg total) rectally 2 (two) times daily. (Patient not taking: Reported on 05/13/2018) 12 suppository 3  . hydrOXYzine (ATARAX/VISTARIL) 25 MG tablet Take 25 mg by mouth 3 (three) times daily.    Marland Kitchen. ibuprofen (ADVIL,MOTRIN) 600 MG tablet Take 1 tablet (600 mg total) by mouth every 6 (six) hours. (Patient not taking: Reported on 05/13/2018) 30 tablet 0  . ketorolac (TORADOL) 10 MG tablet Take 1 tablet (10 mg total) by mouth every 8 (eight) hours. (Patient not taking: Reported on 05/13/2018) 15 tablet 0  . metoCLOPramide (REGLAN) 5 MG tablet Take 1 tablet (5 mg total) by mouth every 8 (eight) hours as needed for nausea or vomiting. (Patient not taking: Reported on 05/13/2018) 15 tablet 0  . oxyCODONE-acetaminophen (PERCOCET/ROXICET) 5-325 MG tablet Take 1-2 tablets by mouth every 4 (four) hours as needed (pain scale 4-7). (Patient not taking: Reported on 05/13/2018) 30 tablet 0  . oxymetazoline (AFRIN) 0.05 % nasal spray Place 1 spray into both nostrils 2 (two) times daily as needed for congestion.    . Prenatal Vit-Fe Fumarate-FA (PRENATAL MULTIVITAMIN) TABS tablet Take 1 tablet by mouth daily at 12 noon. (Patient not taking: Reported on 05/13/2018) 30 tablet 3    Musculoskeletal: Strength & Muscle Tone: within normal limits Gait & Station: normal Patient leans: N/A  Psychiatric Specialty Exam: Physical Exam  Nursing note and vitals reviewed. Constitutional: She appears well-developed and well-nourished.  HENT:  Head: Normocephalic and atraumatic.  Eyes: Pupils are equal, round, and  reactive to light. Conjunctivae are normal.  Neck: Normal range of motion.  Cardiovascular: Normal rate and regular rhythm.  Respiratory: Effort normal.  Musculoskeletal: Normal range of motion.  Neurological: She is alert.  Skin: Skin is warm and dry.    Review of Systems  Psychiatric/Behavioral: Positive for depression, substance abuse and suicidal ideas. The patient is nervous/anxious and has insomnia.   All other systems reviewed and are negative.   Blood pressure 103/78, pulse 97, temperature 98.2 F (36.8 C), temperature source Oral, resp. rate 18, height 5\' 6"  (1.676 m), weight 49.9 kg, last menstrual period 09/05/2018, SpO2 97 %, unknown if currently breastfeeding.Body mass index is 17.75 kg/m.  General Appearance: Disheveled  Eye Contact:  Minimal  Speech:  Pressured  Volume:  Increased  Mood:  Angry, Anxious, Depressed, Hopeless and Worthless  Affect:  Depressed, Inappropriate and Tearful  Thought Process:  Disorganized  Orientation:  Full (Time, Place, and Person)  Thought Content:  Illogical and Delusions  Suicidal Thoughts:  Yes.  without intent/plan  Homicidal Thoughts:  No  Memory:  Immediate;   Poor Recent;  Poor  Judgement:  Poor  Insight:  Lacking  Psychomotor Activity:  Normal  Concentration:  Concentration: Poor and Attention Span: Poor  Recall:  Poor  Fund of Knowledge:  Poor  Language:  Fair  Akathisia:  Negative  Handed:  Right  AIMS (if indicated):     Assets:  Desire for Improvement Social Support  ADL's:  Intact  Cognition:  Impaired,  Mild  Sleep:   Insomnia     Treatment Plan Summary: Daily contact with patient to assess and evaluate symptoms and progress in treatment, Medication management and Plan Patient does meet criteria for psychiatric inpatient admission once a bed becomes available.  Disposition: Recommend psychiatric Inpatient admission when medically cleared. Supportive therapy provided about ongoing stressors.  Catalina Gravel, NP 10/05/2018 10:31 PM

## 2018-10-05 NOTE — ED Triage Notes (Signed)
PT arrives via [facility] under IVC for expressed suicidal ideation. In triage pt denies SI/HI.

## 2018-10-06 DIAGNOSIS — F333 Major depressive disorder, recurrent, severe with psychotic symptoms: Secondary | ICD-10-CM

## 2018-10-06 DIAGNOSIS — F309 Manic episode, unspecified: Secondary | ICD-10-CM

## 2018-10-06 DIAGNOSIS — F600 Paranoid personality disorder: Secondary | ICD-10-CM

## 2018-10-06 DIAGNOSIS — F3189 Other bipolar disorder: Secondary | ICD-10-CM

## 2018-10-06 LAB — URINE DRUG SCREEN, QUALITATIVE (ARMC ONLY)
Amphetamines, Ur Screen: POSITIVE — AB
Barbiturates, Ur Screen: NOT DETECTED
Benzodiazepine, Ur Scrn: POSITIVE — AB
Cannabinoid 50 Ng, Ur ~~LOC~~: POSITIVE — AB
Cocaine Metabolite,Ur ~~LOC~~: NOT DETECTED
MDMA (Ecstasy)Ur Screen: NOT DETECTED
Methadone Scn, Ur: NOT DETECTED
Opiate, Ur Screen: POSITIVE — AB
Phencyclidine (PCP) Ur S: NOT DETECTED
Tricyclic, Ur Screen: NOT DETECTED

## 2018-10-06 LAB — URINALYSIS, COMPLETE (UACMP) WITH MICROSCOPIC
Bilirubin Urine: NEGATIVE
Glucose, UA: NEGATIVE mg/dL
Hgb urine dipstick: NEGATIVE
Ketones, ur: NEGATIVE mg/dL
Nitrite: NEGATIVE
Protein, ur: NEGATIVE mg/dL
Specific Gravity, Urine: 1.028 (ref 1.005–1.030)
pH: 5 (ref 5.0–8.0)

## 2018-10-06 LAB — POCT PREGNANCY, URINE: Preg Test, Ur: NEGATIVE

## 2018-10-06 MED ORDER — ONDANSETRON 4 MG PO TBDP
4.0000 mg | ORAL_TABLET | Freq: Once | ORAL | Status: AC
  Administered 2018-10-06: 4 mg via ORAL
  Filled 2018-10-06: qty 1

## 2018-10-06 MED ORDER — ACETAMINOPHEN 325 MG PO TABS
650.0000 mg | ORAL_TABLET | Freq: Once | ORAL | Status: AC
  Administered 2018-10-06: 650 mg via ORAL
  Filled 2018-10-06: qty 2

## 2018-10-06 NOTE — BH Assessment (Addendum)
Collateral information obtained from patient's father Saphire Barnhart: [phone]): "She called 4:15p and said she was at [facility]. When I arrived she was hysterical and she was psychotic - completely out of it. She couldn't stay on topic like she was rambling. The clerk at [facility] called 911. She screamed at the top of her lungs "I'm going to kill myself". She has a bad drug problem. She was suppose to go to [facility] for detox 11 days ago. She has a 2yo son that has been with me 24/7 since January. She doesn't have a job, she's in full blown addiction, and she got evicted from her home."    He further reports pt has 2 past suicide attempts and was hospitalized. He primarily wants pt to seek help for her drug addiction.   TTS spoke with patient about detox treatment options. Pt denied detox treatment. Pt was provided with a list of local SA resource options.

## 2018-10-06 NOTE — ED Notes (Signed)
Patient on the phone talking with boyfriend and family members, and is loud, and tearful saying she should not be here, someone else put her here. Staff asked patient to lower voice or close her door, she is able to calm down for a little while.

## 2018-10-06 NOTE — ED Provider Notes (Signed)
Discussed with the psychiatric team.  They have reversed her IVC plan to discharge home.  Reviewed patient's labs and were unremarkable.  Patient urine looks contaminated with squamous cells so unlikely to be UTI.   Vanessa Woodbury, MD 10/06/18 [phone]

## 2018-10-06 NOTE — ED Notes (Signed)
Patient discharged home with boyfriend, patient received discharge papers. Patient received belongings and verbalized she has received all of her belongings. Patient appropriate and cooperative, Denies SI/HI AVH. Vital signs taken. NAD noted. Patient refused to sign discharge paperwork.

## 2018-10-06 NOTE — Discharge Instructions (Addendum)
Psychiatric team cleared for discharge return for suicidal thoughts or any other concerns.

## 2018-10-06 NOTE — ED Notes (Signed)
Hourly rounding reveals patient sleeping in room. No complaints, stable, in no acute distress. Q15 minute rounds and monitoring via Security to continue. 

## 2018-10-06 NOTE — ED Notes (Signed)
Patient on phone with boyfriend and very tearful, saying she feels staff didn't help her here, Probation officer explained outpatient treatment for patient, patient did not appear to interested

## 2018-10-06 NOTE — ED Provider Notes (Signed)
-----------------------------------------   5:56 AM on 10/06/2018 -----------------------------------------   Blood pressure 103/78, pulse 97, temperature 98.2 F (36.8 C), temperature source Oral, resp. rate 18, height 5\' 6"  (1.676 m), weight 49.9 kg, last menstrual period 09/05/2018, SpO2 97 %, unknown if currently breastfeeding.  The patient is sleeping at this time.  There have been no acute events since the last update.  Awaiting disposition plan from Behavioral Medicine and/or Social Work team(s).   Paulette Blanch, MD 10/06/18 [phone]

## 2018-10-06 NOTE — ED Notes (Signed)
Patient asked to speak with the doctor she feels she is having withdrawals from substances, patient is sweaty and clammy, patient says she feels shaky, no visible signs of shakiness to writer, patient is tearful and upset.  Vitals  BP 114/60  T 98.2 R 18 P 90  O2 99% RA
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
## 2018-10-06 NOTE — ED Notes (Signed)
Patient talking to the psych NP and TTS

## 2018-10-15 ENCOUNTER — Other Ambulatory Visit: Payer: Self-pay

## 2018-10-15 ENCOUNTER — Emergency Department
Admission: EM | Admit: 2018-10-15 | Discharge: 2018-10-16 | Disposition: A | Discharge status: Other Acute Inpatient Hospital | Payer: Medicaid Other | Attending: Emergency Medicine | Admitting: Emergency Medicine

## 2018-10-15 DIAGNOSIS — F1910 Other psychoactive substance abuse, uncomplicated: Secondary | ICD-10-CM

## 2018-10-15 DIAGNOSIS — Z046 Encounter for general psychiatric examination, requested by authority: Secondary | ICD-10-CM | POA: Insufficient documentation

## 2018-10-15 DIAGNOSIS — Z79899 Other long term (current) drug therapy: Secondary | ICD-10-CM | POA: Insufficient documentation

## 2018-10-15 DIAGNOSIS — F17210 Nicotine dependence, cigarettes, uncomplicated: Secondary | ICD-10-CM | POA: Diagnosis not present

## 2018-10-15 DIAGNOSIS — F329 Major depressive disorder, single episode, unspecified: Secondary | ICD-10-CM | POA: Insufficient documentation

## 2018-10-15 DIAGNOSIS — F1920 Other psychoactive substance dependence, uncomplicated: Secondary | ICD-10-CM | POA: Diagnosis present

## 2018-10-15 LAB — COMPREHENSIVE METABOLIC PANEL
ALT: 20 U/L (ref 0–44)
AST: 19 U/L (ref 15–41)
Albumin: 4.4 g/dL (ref 3.5–5.0)
Alkaline Phosphatase: 93 U/L (ref 38–126)
Anion gap: 9 (ref 5–15)
BUN: 13 mg/dL (ref 6–20)
CO2: 27 mmol/L (ref 22–32)
Calcium: 9.2 mg/dL (ref 8.9–10.3)
Chloride: 104 mmol/L (ref 98–111)
Creatinine, Ser: 0.69 mg/dL (ref 0.44–1.00)
GFR calc Af Amer: >60 mL/min (ref >60)
GFR calc non Af Amer: >60 mL/min (ref >60)
Glucose, Bld: 82 mg/dL (ref 70–99)
Potassium: 4 mmol/L (ref 3.5–5.1)
Sodium: 140 mmol/L (ref 135–145)
Total Bilirubin: 0.5 mg/dL (ref 0.3–1.2)
Total Protein: 7.8 g/dL (ref 6.5–8.1)

## 2018-10-15 LAB — CBC
HCT: 40.6 % (ref 36.0–46.0)
Hemoglobin: 13.3 g/dL (ref 12.0–15.0)
MCH: 29.1 pg (ref 26.0–34.0)
MCHC: 32.8 g/dL (ref 30.0–36.0)
MCV: 88.8 fL (ref 80.0–100.0)
Platelets: 322 10*3/uL (ref 150–400)
RBC: 4.57 MIL/uL (ref 3.87–5.11)
RDW: 13.6 % (ref 11.5–15.5)
WBC: 8.3 10*3/uL (ref 4.0–10.5)
nRBC: 0 % (ref 0.0–0.2)

## 2018-10-15 LAB — URINE DRUG SCREEN, QUALITATIVE (ARMC ONLY)
Amphetamines, Ur Screen: POSITIVE — AB
Barbiturates, Ur Screen: NOT DETECTED
Benzodiazepine, Ur Scrn: POSITIVE — AB
Cannabinoid 50 Ng, Ur ~~LOC~~: POSITIVE — AB
Cocaine Metabolite,Ur ~~LOC~~: POSITIVE — AB
MDMA (Ecstasy)Ur Screen: NOT DETECTED
Methadone Scn, Ur: NOT DETECTED
Opiate, Ur Screen: POSITIVE — AB
Phencyclidine (PCP) Ur S: NOT DETECTED
Tricyclic, Ur Screen: NOT DETECTED

## 2018-10-15 LAB — ACETAMINOPHEN LEVEL: Acetaminophen (Tylenol), Serum: <10 ug/mL — ABNORMAL LOW (ref 10–30)

## 2018-10-15 LAB — SALICYLATE LEVEL: Salicylate Lvl: <7 mg/dL (ref 2.8–30.0)

## 2018-10-15 LAB — ETHANOL: Alcohol, Ethyl (B): <10 mg/dL (ref <10)

## 2018-10-15 LAB — POCT PREGNANCY, URINE: Preg Test, Ur: NEGATIVE

## 2018-10-15 MED ORDER — LORAZEPAM 1 MG PO TABS
1.0000 mg | ORAL_TABLET | Freq: Once | ORAL | Status: AC
  Administered 2018-10-15: 1 mg via ORAL
  Filled 2018-10-15: qty 1

## 2018-10-15 MED ORDER — NICOTINE 21 MG/24HR TD PT24
21.0000 mg | MEDICATED_PATCH | Freq: Once | TRANSDERMAL | Status: DC
  Administered 2018-10-15: 22:00:00 21 mg via TRANSDERMAL
  Filled 2018-10-15: qty 1

## 2018-10-15 NOTE — ED Provider Notes (Signed)
-----------------------------------------   11:40 PM on 10/15/2018 -----------------------------------------  Patient had already been accepted to [facility].  This happened through [place].  I am told this by the TTS counselor so I will cancel his consult.  Likely transport in the morning.   Paulette Blanch, MD 10/16/18 [phone]

## 2018-10-15 NOTE — ED Provider Notes (Signed)
[facility] Emergency Department Provider Note  ____________________________________________   I have reviewed the triage vital signs and the nursing notes.   HISTORY  Chief Complaint Psychiatric Evaluation   History limited by: Not Limited   HPI Doris Roberson is a 28 y.o. female who presents to the emergency department today under IVC because of concern for drug use and inability to care for herself. The patient does state that she would like to detox. Was at [facility] earlier and they were trying to arrange for a detox facility but were not able to until too late in the day. She states that she typically will have bad nausea and back pain when she goes through withdrawals. She denies any recent medical illness or complaint.   Records reviewed. Per medical record review patient has a history of alcohol use, depression.  Past Medical History:  Diagnosis Date  . Alcohol abuse    s/p inpatient rehab at [facility] in 2011, two [place]  . Depression    h/o Tylenol pm overdose, subsequent admision to behavioral health  . Mood disorder [facility](HCC)     Patient Active Problem List   Diagnosis Date Noted  . Bipolar I disorder, most recent episode (or current) depressed, severe, specified as with psychotic behavior (HCC) 10/05/2018  . Labor and delivery, indication for care 05/05/2016  . Bacteria in urine 04/14/2016  . Pilonidal cyst 04/08/2016  . Hyperemesis complicating pregnancy, antepartum 03/15/2016  . Rh negative status during pregnancy in second trimester 01/31/2016  . Smoking (tobacco) complicating pregnancy, third trimester 01/31/2016  . Family history of autism 10/22/2015  . First trimester screening 10/22/2015  . Tobacco use in pregnancy, antepartum 10/05/2015  . Alcohol abuse   . Mood disorder (HCC)   . Rash 09/18/2010  . Pregnant state, incidental 08/14/2010  . 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 ALCOHOL ABUSE 03/12/2010  . DEPRESSION  03/12/2010  . WEIGHT LOSS 03/12/2010    Past Surgical History:  Procedure Laterality Date  . ANTERIOR CRUCIATE LIGAMENT REPAIR Left 2008  . CESAREAN SECTION N/A 05/07/2016   Procedure: CESAREAN SECTION;  Surgeon: Christeen DouglasBethany Beasley, MD;  Location: [facility] ORS;  Service: Obstetrics;  Laterality: N/A;  Female born @ [id number] Apgars: 9/9 Weight:7lb 11oz    Prior to Admission medications   Medication Sig Start Date End Date Taking? Authorizing Provider  buPROPion (WELLBUTRIN XL) 150 MG 24 hr tablet Take 150 mg by mouth daily. 07/01/18   [provider]  cephALEXin (KEFLEX) 500 MG capsule Take 1 capsule (500 mg total) by mouth 4 (four) times daily. Patient not taking: Reported on 10/05/2018 07/01/18   Nira Connardama, Pedro Eduardo, MD  ferrous sulfate 325 (65 FE) MG tablet Take 1 tablet (325 mg total) by mouth daily with breakfast. Patient not taking: Reported on 05/13/2018 05/09/16   Karena AddisonSigmon, Meredith C, CNM  fluticasone (FLONASE) 50 MCG/ACT nasal spray Place 2 sprays into both nostrils daily. Patient not taking: Reported on 07/01/2018 03/28/16   Judeth HornLawrence, Erin, NP  hydrocortisone (ANUSOL-HC) 25 MG suppository Place 1 suppository (25 mg total) rectally 2 (two) times daily. Patient not taking: Reported on 05/13/2018 04/10/16   Thressa ShellerHogan, Heather D, CNM  hydrOXYzine (ATARAX/VISTARIL) 25 MG tablet Take 25 mg by mouth 3 (three) times daily. 07/01/18   [provider]  ibuprofen (ADVIL,MOTRIN) 600 MG tablet Take 1 tablet (600 mg total) by mouth every 6 (six) hours. Patient not taking: Reported on 05/13/2018 05/09/16   Karena AddisonSigmon, Meredith C, CNM  ketorolac (TORADOL) 10  MG tablet Take 1 tablet (10 mg total) by mouth every 8 (eight) hours. Patient not taking: Reported on 05/13/2018 07/21/17   Menshew, Charlesetta IvoryJenise V Bacon, PA-C  metoCLOPramide (REGLAN) 5 MG tablet Take 1 tablet (5 mg total) by mouth every 8 (eight) hours as needed for nausea or vomiting. Patient not taking: Reported on 05/13/2018 07/21/17   Menshew, Charlesetta IvoryJenise V Bacon, PA-C   mirtazapine (REMERON) 15 MG tablet Take 15 mg by mouth at bedtime.    [provider]  oxyCODONE-acetaminophen (PERCOCET/ROXICET) 5-325 MG tablet Take 1-2 tablets by mouth every 4 (four) hours as needed (pain scale 4-7). Patient not taking: Reported on 05/13/2018 05/09/16   Karena AddisonSigmon, Meredith C, CNM  oxymetazoline (AFRIN) 0.05 % nasal spray Place 1 spray into both nostrils 2 (two) times daily as needed for congestion.    [provider]  Prenatal Vit-Fe Fumarate-FA (PRENATAL MULTIVITAMIN) TABS tablet Take 1 tablet by mouth daily at 12 noon. Patient not taking: Reported on 05/13/2018 05/10/16   Karena AddisonSigmon, Meredith C, CNM  QUEtiapine (SEROQUEL) 100 MG tablet Take 100 mg by mouth at bedtime.    [provider]  traZODone (DESYREL) 50 MG tablet Take 50 mg by mouth at bedtime. 09/20/18   [provider]    Allergies Other and Nitrofurantoin  Family History  Problem Relation Age of Onset  . Alcohol abuse Other   . Heart disease Father     Social History Social History   Tobacco Use  . Smoking status: Current Every Day Smoker    Packs/day: 0.50  . Smokeless tobacco: Never Used  Substance Use Topics  . Alcohol use: No  . Drug use: No    Review of Systems Constitutional: No fever/chills Eyes: No visual changes. ENT: No sore throat. Cardiovascular: Denies chest pain. Respiratory: Denies shortness of breath. Gastrointestinal: No abdominal pain.  No nausea, no vomiting.  No diarrhea.   Genitourinary: Negative for dysuria. Musculoskeletal: Negative for back pain. Skin: Negative for rash. Neurological: Negative for headaches, focal weakness or numbness.  ____________________________________________   PHYSICAL EXAM:  VITAL SIGNS: ED Triage Vitals  Enc Vitals Group     BP 10/15/18 1946 (!) 121/91     Pulse Rate 10/15/18 1946 66     Resp 10/15/18 1946 16     Temp 10/15/18 1946 98.6 F (37 C)     Temp Source 10/15/18 1946 Oral     SpO2 10/15/18 1946  100 %     Weight 10/15/18 1947 101 lb (45.8 kg)     Height 10/15/18 1947 5\' 4"  (1.626 m)     Head Circumference --      Peak Flow --      Pain Score 10/15/18 1947 0   Constitutional: Alert and oriented.  Eyes: Conjunctivae are normal.  ENT      Head: Normocephalic and atraumatic.      Nose: No congestion/rhinnorhea.      Mouth/Throat: Mucous membranes are moist.      Neck: No stridor. Hematological/Lymphatic/Immunilogical: No cervical lymphadenopathy. Cardiovascular: Normal rate, regular rhythm.  No murmurs, rubs, or gallops.  Respiratory: Normal respiratory effort without tachypnea nor retractions. Breath sounds are clear and equal bilaterally. No wheezes/rales/rhonchi. Gastrointestinal: Soft and non tender. No rebound. No guarding.  Genitourinary: Deferred Musculoskeletal: Normal range of motion in all extremities. No lower extremity edema. Neurologic:  Normal speech and language. No gross focal neurologic deficits are appreciated.  Skin:  Skin is warm, dry and intact. No rash noted. Psychiatric: Mood and affect  are normal. Speech and behavior are normal. Patient exhibits appropriate insight and judgment.  ____________________________________________    LABS (pertinent positives/negatives)  Upreg negative Ethanol <10 CBC wbc 8.3, hgb 13.3, plt 322 CMP wnl  UDS positive cannabinoid, benzodiazepine, opiate, cocaine, amphetamines  ____________________________________________   EKG  None  ____________________________________________    RADIOLOGY  None  ____________________________________________   PROCEDURES  Procedures  ____________________________________________   INITIAL IMPRESSION / ASSESSMENT AND PLAN / ED COURSE  Pertinent labs & imaging results that were available during my care of the patient were reviewed by me and considered in my medical decision making (see chart for details).   Patient presented to the emergency department today under IVC  because of concerns for self harm with heroin use.  Per the patient she did have a spot arrange for her however it was too late in the day to get her there. Will have TTS help coordinate.    ____________________________________________   FINAL CLINICAL IMPRESSION(S) / ED DIAGNOSES  Final diagnoses:  Polysubstance abuse ([place])     Note: This dictation was prepared with Dragon dictation. Any transcriptional errors that result from this process are unintentional     Nance Pear, MD 10/15/18 2230

## 2018-10-15 NOTE — ED Notes (Signed)
Pt sleeping. 

## 2018-10-15 NOTE — ED Notes (Signed)
Report to matt, rn.

## 2018-10-15 NOTE — ED Notes (Signed)
Pt given hospital scrubs by this tech. Personal belongings include: camouflage shirt, bra, jeans, black sandals, black tennis shoes, underwear, specimen cup of pt jewelry, shoulder bag.   Pt has tragus peircings left in ears. Unable to remove.

## 2018-10-15 NOTE — ED Triage Notes (Signed)
Pt here with IVC papers for heroin use. Pt states she is going to detox in am at [facility]. Pt denies SI or HI, coopeartive.

## 2018-10-16 MED ORDER — HYDROXYZINE HCL 25 MG PO TABS
50.0000 mg | ORAL_TABLET | Freq: Once | ORAL | Status: AC
  Administered 2018-10-16: 50 mg via ORAL
  Filled 2018-10-16: qty 2

## 2018-10-16 NOTE — ED Notes (Signed)
Pt. Woke up and came to nursing station to request graham crackers and a sleep aid.  ED doctor notified orders issued.

## 2018-10-16 NOTE — ED Notes (Signed)
Pt. Requested and was given drink.

## 2018-10-16 NOTE — BH Assessment (Signed)
Writer contacted [facility] ([phone]). Accepting physician is Dr. Leatrice Jewels

## 2018-10-16 NOTE — ED Notes (Signed)
BEHAVIORAL HEALTH ROUNDING Patient sleeping: No. Patient alert and oriented: yes Behavior appropriate: Yes.  ; If no, describe:  Nutrition and fluids offered: yes Toileting and hygiene offered: Yes  Sitter present: q15 minute observations and security camera monitoring Law enforcement present: Yes  ODS  

## 2018-10-16 NOTE — ED Notes (Signed)

## 2018-10-16 NOTE — ED Notes (Signed)
Report received from Matt RN  Patient observed lying in bed with eyes closed  Even, unlabored respirations observed   NAD pt appears to be sleeping  I will continue to monitor along with every 15 minute visual observations and ongoing security camera monitoring    

## 2018-10-16 NOTE — ED Notes (Signed)
Pt. Oriented to unit.  Pt. Advised of security cameras and 15 minute safety checks.

## 2018-10-16 NOTE — BH Assessment (Signed)
Spoke with Doris Roberson at [facility] and he stated the pt is accepted to [facility] and can arrive at 0900.  It is unclear if the pt knows about her placement.  TTS attempted to inform the pt, but she was sleep at the time.

## 2018-10-16 NOTE — ED Notes (Signed)
ED BHU [place] Is the patient under IVC or is there intent for IVC: Yes.   Is the patient medically cleared: Yes.   Is there vacancy in the ED BHU: Yes.   Is the population mix appropriate for patient: Yes.   Is the patient awaiting placement in inpatient or outpatient setting: Yes.  She has been accepted to [facility]    Has the patient had a psychiatric consult: Yes.   Survey of unit performed for contraband, proper placement and condition of furniture, tampering with fixtures in bathroom, shower, and each patient room: Yes.  ; Findings:  APPEARANCE/BEHAVIOR Calm and cooperative NEURO ASSESSMENT Orientation: oriented x3  Denies pain Hallucinations: No.None noted (Hallucinations)  denies Speech: Normal Gait: normal RESPIRATORY ASSESSMENT Even  Unlabored respirations  CARDIOVASCULAR ASSESSMENT Pulses equal   regular rate  Skin warm and dry   GASTROINTESTINAL ASSESSMENT no GI complaint EXTREMITIES Full ROM  PLAN OF CARE Provide calm/safe environment. Vital signs assessed twice daily. ED BHU Assessment once each 12-hour shift.  Assure the ED provider has rounded once each shift. Provide and encourage hygiene. Provide redirection as needed. Assess for escalating behavior; address immediately and inform ED provider.  Assess family dynamic and appropriateness for visitation as needed: Yes.  ; If necessary, describe findings:  Educate the patient/family about BHU procedures/visitation: Yes.  ; If necessary, describe findings:

## 2018-10-16 NOTE — BH Assessment (Signed)
Patient has been accepted to [facility].  Patient assigned to room TBD [place] Accepting physician is Dr. Dellie Catholic.  Call report to [phone].  Representative was [employer].   Pt can arrive after 0900.  ER Staff is aware of it:   Dr. Beather Arbour, ER MD  April and [facility] Patient's Nurse

## 2018-10-30 ENCOUNTER — Other Ambulatory Visit: Payer: Self-pay

## 2018-10-30 ENCOUNTER — Emergency Department
Admission: EM | Admit: 2018-10-30 | Discharge: 2018-10-31 | Disposition: A | Discharge status: Home / Self Care | Payer: Medicaid Other | Attending: Emergency Medicine | Admitting: Emergency Medicine

## 2018-10-30 ENCOUNTER — Encounter: Payer: Self-pay | Admitting: Emergency Medicine

## 2018-10-30 DIAGNOSIS — Z79899 Other long term (current) drug therapy: Secondary | ICD-10-CM | POA: Diagnosis not present

## 2018-10-30 DIAGNOSIS — F1110 Opioid abuse, uncomplicated: Secondary | ICD-10-CM

## 2018-10-30 DIAGNOSIS — F17200 Nicotine dependence, unspecified, uncomplicated: Secondary | ICD-10-CM | POA: Insufficient documentation

## 2018-10-30 DIAGNOSIS — F1123 Opioid dependence with withdrawal: Secondary | ICD-10-CM | POA: Insufficient documentation

## 2018-10-30 LAB — PREGNANCY, URINE: Preg Test, Ur: NEGATIVE

## 2018-10-30 LAB — URINALYSIS, COMPLETE (UACMP) WITH MICROSCOPIC
Bilirubin Urine: NEGATIVE
Glucose, UA: NEGATIVE mg/dL
Hgb urine dipstick: NEGATIVE
Ketones, ur: NEGATIVE mg/dL
Leukocytes,Ua: NEGATIVE
Nitrite: NEGATIVE
Protein, ur: NEGATIVE mg/dL
Specific Gravity, Urine: 1.019 (ref 1.005–1.030)
pH: 5 (ref 5.0–8.0)

## 2018-10-30 LAB — URINE DRUG SCREEN, QUALITATIVE (ARMC ONLY)
Amphetamines, Ur Screen: NOT DETECTED
Barbiturates, Ur Screen: NOT DETECTED
Benzodiazepine, Ur Scrn: NOT DETECTED
Cannabinoid 50 Ng, Ur ~~LOC~~: POSITIVE — AB
Cocaine Metabolite,Ur ~~LOC~~: NOT DETECTED
MDMA (Ecstasy)Ur Screen: NOT DETECTED
Methadone Scn, Ur: NOT DETECTED
Opiate, Ur Screen: POSITIVE — AB
Phencyclidine (PCP) Ur S: NOT DETECTED
Tricyclic, Ur Screen: POSITIVE — AB

## 2018-10-30 MED ORDER — TRAZODONE HCL 100 MG PO TABS
100.0000 mg | ORAL_TABLET | Freq: Every day | ORAL | Status: DC
  Administered 2018-10-30: 100 mg via ORAL
  Filled 2018-10-30: qty 1

## 2018-10-30 MED ORDER — QUETIAPINE FUMARATE 25 MG PO TABS
100.0000 mg | ORAL_TABLET | Freq: Every day | ORAL | Status: DC
  Administered 2018-10-30: 100 mg via ORAL
  Filled 2018-10-30: qty 4

## 2018-10-30 MED ORDER — HYDROXYZINE HCL 25 MG PO TABS
25.0000 mg | ORAL_TABLET | Freq: Three times a day (TID) | ORAL | Status: DC
  Administered 2018-10-30 – 2018-10-31 (×2): 25 mg via ORAL
  Filled 2018-10-30 (×2): qty 1

## 2018-10-30 MED ORDER — NICOTINE 14 MG/24HR TD PT24
14.0000 mg | MEDICATED_PATCH | Freq: Once | TRANSDERMAL | Status: DC
  Administered 2018-10-30: 18:00:00 14 mg via TRANSDERMAL
  Filled 2018-10-30: qty 1

## 2018-10-30 MED ORDER — LORAZEPAM 1 MG PO TABS
1.0000 mg | ORAL_TABLET | Freq: Once | ORAL | Status: AC
  Administered 2018-10-30: 1 mg via ORAL
  Filled 2018-10-30: qty 1

## 2018-10-30 NOTE — ED Triage Notes (Signed)
Pt arrived via POV with reports of needing detox, pt states "I need to detox"   Pt states she would like to go [facility]. She has been there in the past.  Pt states she is detoxing from Heroin, last used yesterday.  Pt reports daily use of heroin. Denies any abscess.

## 2018-10-30 NOTE — ED Notes (Signed)
Report given to Anne RN

## 2018-10-30 NOTE — ED Notes (Addendum)
Urine sample sent at this time if needed for future orders

## 2018-10-30 NOTE — ED Notes (Signed)
Pt wants detox, states uses heroin daily. Last use was yesterday. Denies SI/HI

## 2018-10-30 NOTE — ED Notes (Signed)
ED Provider at bedside. 

## 2018-10-30 NOTE — BH Assessment (Addendum)
Writer discussed with the patient about the option for detox/treatment with [facility].  Patient was in the agreement with the plan.  [facility] ([phone]) states they do not have any beds. Check back Monday.   Referral information faxed to [facility] [phone])

## 2018-10-30 NOTE — BH Assessment (Signed)
Assessment Note  Doris Roberson is an 28 y.o. female who presents to the ER seeking assistance with detox from her heroin use. She states she have use on a daily basis for approximately six months. Her symptoms of withdrawal are; restless legs, cold sweets and chills. She denies the use of any other mind-altering substances. She also denies history of violence and aggression. She has an upcoming court date, 12/13/2018 & 01/14/2019 for Infraction-SPEEDING and Misdemeanor-MISDEMEANOR AID AND ABET.  During the interview, the patient was calm, cooperative and pleasant. She was able to provide appropriate answers to the questions.  Diagnosis: Substance Use Disorder  Past Medical History:  Past Medical History:  Diagnosis Date  . Alcohol abuse    s/p inpatient rehab at [facility] in 2011, two [place]  . Depression    h/o Tylenol pm overdose, subsequent admision to behavioral health  . Mood disorder [facility](HCC)     Past Surgical History:  Procedure Laterality Date  . ANTERIOR CRUCIATE LIGAMENT REPAIR Left 2008  . CESAREAN SECTION N/A 05/07/2016   Procedure: CESAREAN SECTION;  Surgeon: Christeen DouglasBethany Beasley, MD;  Location: [facility] ORS;  Service: Obstetrics;  Laterality: N/A;  Female born @ [id number] Apgars: 9/9 Weight:7lb 11oz    Family History:  Family History  Problem Relation Age of Onset  . Alcohol abuse Other   . Heart disease Father     Social History:  reports that she has been smoking. She has been smoking about 0.50 packs per day. She has never used smokeless tobacco. She reports that she does not drink alcohol or use drugs.  Additional Social History:  Alcohol / Drug Use Pain Medications: See PTA Prescriptions: See PTA Over the Counter: See PTA History of alcohol / drug use?: Yes Longest period of sobriety (when/how long): Unable to quantify Negative Consequences of Use: Personal relationships Withdrawal Symptoms: Nausea / Vomiting, Sweats Substance #1 Name of Substance 1: Unable to  quantify 1 - Age of First Use: 27 1 - Amount (size/oz): "1 gram" 1 - Frequency: Daily 1 - Duration: "Six months" 1 - Last Use / Amount: 10/29/2018  CIWA: CIWA-Ar BP: 109/69 Pulse Rate: 72 COWS:    Allergies:  Allergies  Allergen Reactions  . Other Other (See Comments)    An abx, doesn't remember name. Stomach upset   . Nitrofurantoin Rash    Home Medications: (Not in a hospital admission)   OB/GYN Status:  Patient's last menstrual period was 10/15/2018.  General Assessment Data [facility] Assessment: In system Is this a Tele or Face-to-Face Assessment?: Face-to-Face Is this an Initial Assessment or a Re-assessment for this encounter?: Initial Assessment Patient Accompanied by:: N/A Language Other than English: No Living Arrangements: Other (Comment)(Private Home) What gender do you identify as?: Female Marital status: Single Pregnancy Status: No Living Arrangements: Children Can pt return to current living arrangement?: Yes Admission Status: Voluntary Is patient capable of signing voluntary admission?: Yes Referral Source: Self/Family/Friend Insurance type: Medicaid  Medical Screening Exam [facility] Walk-in ONLY) Medical Exam completed: Yes  Crisis Care Plan Living Arrangements: Children Legal Guardian: Other:(Self) Name of Psychiatrist: Reports of none Name of Therapist: Reports of none  Education Status Is patient currently in school?: No  Risk to self with the past 6 months Suicidal Ideation: No Has patient been a risk to self within the past 6 months prior to admission? : No Suicidal Intent: No Has patient had any suicidal intent within the past 6 months prior to admission? : No  Is patient at risk for suicide?: No Suicidal Plan?: No Has patient had any suicidal plan within the past 6 months prior to admission? : No Access to Means: No What has been your use of drugs/alcohol within the last 12 months?: Heroin Previous  Attempts/Gestures: No How many times?: 0 Other Self Harm Risks: Active drug use Triggers for Past Attempts: None known Intentional Self Injurious Behavior: None Family Suicide History: No Recent stressful life event(s): Other (Comment) Persecutory voices/beliefs?: No Depression: Yes Depression Symptoms: Guilt, Isolating Substance abuse history and/or treatment for substance abuse?: Yes Suicide prevention information given to non-admitted patients: Not applicable  Risk to Others within the past 6 months Homicidal Ideation: No Does patient have any lifetime risk of violence toward others beyond the six months prior to admission? : No Thoughts of Harm to Others: No Current Homicidal Intent: No Current Homicidal Plan: No Access to Homicidal Means: No Identified Victim: Reports of none History of harm to others?: No Assessment of Violence: None Noted Violent Behavior Description: Reports of none Does patient have access to weapons?: No Criminal Charges Pending?: No Does patient have a court date: No Is patient on probation?: No  Psychosis Hallucinations: None noted Delusions: None noted  Mental Status Report Appearance/Hygiene: Unremarkable, In scrubs Eye Contact: Good Motor Activity: Freedom of movement, Unremarkable Speech: Logical/coherent, Unremarkable Level of Consciousness: Alert Mood: Sad, Pleasant Affect: Appropriate to circumstance, Sad Anxiety Level: None Thought Processes: Coherent, Relevant Judgement: Unimpaired Orientation: Person, Place, Time, Situation, Appropriate for developmental age Obsessive Compulsive Thoughts/Behaviors: None  Cognitive Functioning Concentration: Normal Memory: Recent Intact, Remote Intact Is patient IDD: No Insight: Fair Impulse Control: Fair Appetite: Good Have you had any weight changes? : No Change Sleep: No Change Total Hours of Sleep: 8 Vegetative Symptoms: None  ADLScreening [facility] Assessment Services) Patient's  cognitive ability adequate to safely complete daily activities?: Yes Patient able to express need for assistance with ADLs?: Yes Independently performs ADLs?: Yes (appropriate for developmental age)  Prior Inpatient Therapy Prior Inpatient Therapy: No  Prior Outpatient Therapy Prior Outpatient Therapy: No Does patient have an ACCT team?: No Does patient have Intensive In-House Services?  : No Does patient have [facility] services? : No Does patient have P4CC services?: No  ADL Screening (condition at time of admission) Patient's cognitive ability adequate to safely complete daily activities?: Yes Is the patient deaf or have difficulty hearing?: No Does the patient have difficulty seeing, even when wearing glasses/contacts?: No Does the patient have difficulty concentrating, remembering, or making decisions?: No Patient able to express need for assistance with ADLs?: Yes Does the patient have difficulty dressing or bathing?: No Independently performs ADLs?: Yes (appropriate for developmental age) Does the patient have difficulty walking or climbing stairs?: No Weakness of Legs: None Weakness of Arms/Hands: None  Home Assistive Devices/Equipment Home Assistive Devices/Equipment: None  Therapy Consults (therapy consults require a physician order) PT Evaluation Needed: No OT Evalulation Needed: No SLP Evaluation Needed: No Abuse/Neglect Assessment (Assessment to be complete while patient is alone) Abuse/Neglect Assessment Can Be Completed: Yes Physical Abuse: Denies Verbal Abuse: Denies Sexual Abuse: Denies Exploitation of patient/patient's resources: Denies Self-Neglect: Denies Values / Beliefs Cultural Requests During Hospitalization: None Spiritual Requests During Hospitalization: None Consults Spiritual Care Consult Needed: No Social Work Consult Needed: No Merchant navy officerAdvance Directives (For Healthcare) Does Patient Have a Medical Advance Directive?: No Would patient like  information on creating a medical advance directive?: No - Patient declined       Child/Adolescent Assessment Running Away Risk:  Denies(Patient is an adult)  Disposition:  Disposition Initial Assessment Completed for this Encounter: Yes  On Site Evaluation by:   Reviewed with Physician:    Gunnar Fusi MS, LCAS, [facility], [place], [place] Therapeutic Triage Specialist 10/30/2018 4:31 PM

## 2018-10-30 NOTE — ED Notes (Signed)
Patient was informed that Doris Roberson called and wanted her to call him back at [phone]. Patient was given the message and a phone, but declined to return the phone call at this time.

## 2018-10-30 NOTE — ED Provider Notes (Signed)
[facility] Emergency Department Provider Note  ____________________________________________  Time seen: Approximately 12:41 PM  I have reviewed the triage vital signs and the nursing notes.   HISTORY  Chief Complaint Heroin abuse   HPI Doris Roberson is a 28 y.o. female with a history of alcohol abuse, depression, bipolar disorder, heroin abuse who comes the ED reporting daily heroin use and requesting detox.  Denies any SI HI or hallucinations.  No chest pain or shortness of breath or fevers or body aches.  She was recently seen in the hospital 2 weeks ago for the same.  She was arranged for inpatient detox at old [facility], stayed there a week and was discharged from there 1 week ago.  Unfortunately she reports "I made a bad choice" and relapsed pretty quickly.      Past Medical History:  Diagnosis Date  . Alcohol abuse    s/p inpatient rehab at [facility] in 2011, [place]  . Depression    h/o Tylenol pm overdose, subsequent admision to behavioral health  . Mood disorder [facility](HCC)      Patient Active Problem List   Diagnosis Date Noted  . Bipolar I disorder, most recent episode (or current) depressed, severe, specified as with psychotic behavior (HCC) 10/05/2018  . Labor and delivery, indication for care 05/05/2016  . Bacteria in urine 04/14/2016  . Pilonidal cyst 04/08/2016  . Hyperemesis complicating pregnancy, antepartum 03/15/2016  . Rh negative status during pregnancy in second trimester 01/31/2016  . Smoking (tobacco) complicating pregnancy, third trimester 01/31/2016  . Family history of autism 10/22/2015  . First trimester screening 10/22/2015  . Tobacco use in pregnancy, antepartum 10/05/2015  . Alcohol abuse   . Mood disorder (HCC)   . Rash 09/18/2010  . Pregnant state, incidental 08/14/2010  . 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 ALCOHOL ABUSE 03/12/2010  . DEPRESSION 03/12/2010  . WEIGHT LOSS 03/12/2010      Past Surgical History:  Procedure Laterality Date  . ANTERIOR CRUCIATE LIGAMENT REPAIR Left 2008  . CESAREAN SECTION N/A 05/07/2016   Procedure: CESAREAN SECTION;  Surgeon: Christeen DouglasBethany Beasley, MD;  Location: [facility] ORS;  Service: Obstetrics;  Laterality: N/A;  Female born @ [id number] Apgars: 9/9 Weight:7lb 11oz     Prior to Admission medications   Medication Sig Start Date End Date Taking? Authorizing Provider  buPROPion (WELLBUTRIN XL) 150 MG 24 hr tablet Take 150 mg by mouth daily. 07/01/18   [provider]  cephALEXin (KEFLEX) 500 MG capsule Take 1 capsule (500 mg total) by mouth 4 (four) times daily. Patient not taking: Reported on 10/05/2018 07/01/18   Nira Connardama, Pedro Eduardo, MD  ferrous sulfate 325 (65 FE) MG tablet Take 1 tablet (325 mg total) by mouth daily with breakfast. Patient not taking: Reported on 05/13/2018 05/09/16   Karena AddisonSigmon, Meredith C, CNM  fluticasone (FLONASE) 50 MCG/ACT nasal spray Place 2 sprays into both nostrils daily. Patient not taking: Reported on 07/01/2018 03/28/16   Judeth HornLawrence, Erin, NP  hydrocortisone (ANUSOL-HC) 25 MG suppository Place 1 suppository (25 mg total) rectally 2 (two) times daily. Patient not taking: Reported on 05/13/2018 04/10/16   Thressa ShellerHogan, Heather D, CNM  hydrOXYzine (ATARAX/VISTARIL) 25 MG tablet Take 25 mg by mouth 3 (three) times daily. 07/01/18   [provider]  ibuprofen (ADVIL,MOTRIN) 600 MG tablet Take 1 tablet (600 mg total) by mouth every 6 (six) hours. Patient not taking: Reported on 05/13/2018 05/09/16   Karena AddisonSigmon, Meredith C, CNM  ketorolac (TORADOL) 10 MG  tablet Take 1 tablet (10 mg total) by mouth every 8 (eight) hours. Patient not taking: Reported on 05/13/2018 07/21/17   Menshew, Charlesetta IvoryJenise V Bacon, PA-C  metoCLOPramide (REGLAN) 5 MG tablet Take 1 tablet (5 mg total) by mouth every 8 (eight) hours as needed for nausea or vomiting. Patient not taking: Reported on 05/13/2018 07/21/17   Menshew, Charlesetta IvoryJenise V Bacon, PA-C  mirtazapine (REMERON) 15 MG  tablet Take 15 mg by mouth at bedtime.    [provider]  oxyCODONE-acetaminophen (PERCOCET/ROXICET) 5-325 MG tablet Take 1-2 tablets by mouth every 4 (four) hours as needed (pain scale 4-7). Patient not taking: Reported on 05/13/2018 05/09/16   Karena AddisonSigmon, Meredith C, CNM  oxymetazoline (AFRIN) 0.05 % nasal spray Place 1 spray into both nostrils 2 (two) times daily as needed for congestion.    [provider]  Prenatal Vit-Fe Fumarate-FA (PRENATAL MULTIVITAMIN) TABS tablet Take 1 tablet by mouth daily at 12 noon. Patient not taking: Reported on 05/13/2018 05/10/16   Karena AddisonSigmon, Meredith C, CNM  QUEtiapine (SEROQUEL) 100 MG tablet Take 100 mg by mouth at bedtime.    [provider]  traZODone (DESYREL) 50 MG tablet Take 50 mg by mouth at bedtime. 09/20/18   [provider]     Allergies Other and Nitrofurantoin   Family History  Problem Relation Age of Onset  . Alcohol abuse Other   . Heart disease Father     Social History Social History   Tobacco Use  . Smoking status: Current Every Day Smoker    Packs/day: 0.50  . Smokeless tobacco: Never Used  Substance Use Topics  . Alcohol use: No  . Drug use: No    Review of Systems  Constitutional:   No fever or chills.  ENT:   No sore throat. No rhinorrhea. Cardiovascular:   No chest pain or syncope. Respiratory:   No dyspnea or cough. Gastrointestinal:   Negative for abdominal pain, vomiting and diarrhea.  Musculoskeletal:   Negative for focal pain or swelling All other systems reviewed and are negative except as documented above in ROS and HPI.  ____________________________________________   PHYSICAL EXAM:  VITAL SIGNS: ED Triage Vitals  Enc Vitals Group     BP 10/30/18 1217 109/69     Pulse Rate 10/30/18 1217 72     Resp 10/30/18 1217 18     Temp 10/30/18 1217 98.8 F (37.1 C)     Temp src --      SpO2 10/30/18 1217 99 %     Weight 10/30/18 1216 101 lb (45.8 kg)     Height 10/30/18 1216 5'  4" (1.626 m)     Head Circumference --      Peak Flow --      Pain Score 10/30/18 1217 0     Pain Loc --      Pain Edu? --      Excl. in GC? --     Vital signs reviewed, nursing assessments reviewed.   Constitutional:   Alert and oriented. Non-toxic appearance. Eyes:   Conjunctivae are normal. EOMI. PERRL. ENT      Head:   Normocephalic and atraumatic.      Nose:   No congestion/rhinnorhea.       Mouth/Throat:   MMM, no pharyngeal erythema. No peritonsillar mass.       Neck:   No meningismus. Full ROM. Hematological/Lymphatic/Immunilogical:   No cervical lymphadenopathy. Cardiovascular:   RRR. Symmetric bilateral radial and DP pulses.  No murmurs. Cap  refill less than 2 seconds. Respiratory:   Normal respiratory effort without tachypnea/retractions. Breath sounds are clear and equal bilaterally. No wheezes/rales/rhonchi. Gastrointestinal:   Soft and nontender. Non distended. There is no CVA tenderness.  No rebound, rigidity, or guarding. Musculoskeletal:   Normal range of motion in all extremities. No joint effusions.  No lower extremity tenderness.  No edema. Neurologic:   Normal speech and language.  Motor grossly intact. No acute focal neurologic deficits are appreciated.  Skin:    Skin is warm, dry and intact. No rash noted.  No petechiae, purpura, or bullae.  No embolic phenomena ____________________________________________    LABS (pertinent positives/negatives) (all labs ordered are listed, but only abnormal results are displayed) Labs Reviewed  URINALYSIS, COMPLETE ([facility]) WITH MICROSCOPIC  PREGNANCY, URINE  URINE DRUG SCREEN, QUALITATIVE ([place])   ____________________________________________   EKG    ____________________________________________    RADIOLOGY  No results found.  ____________________________________________   PROCEDURES Procedures  ____________________________________________    CLINICAL IMPRESSION / ASSESSMENT AND PLAN / ED  COURSE  Medications ordered in the ED: Medications - No data to display  Pertinent labs & imaging results that were available during my care of the patient were reviewed by me and considered in my medical decision making (see chart for details).  Doris Roberson was evaluated in Emergency Department on 10/30/2018 for the symptoms described in the history of present illness. She was evaluated in the context of the global COVID-19 pandemic, which necessitated consideration that the patient might be at risk for infection with the SARS-CoV-2 virus that causes COVID-19. Institutional protocols and algorithms that pertain to the evaluation of patients at risk for COVID-19 are in a state of rapid change based on information released by regulatory bodies including the CDC and federal and state organizations. These policies and algorithms were followed during the patient's care in the ED.   Patient presents for assistance with opiate dependence and abuse.  TTS consulted.  Vital signs are normal, she is medically stable for inpatient or outpatient substance abuse management.      ____________________________________________   FINAL CLINICAL IMPRESSION(S) / ED DIAGNOSES    Final diagnoses:  Heroin abuse [facility])     ED Discharge Orders    None      Portions of this note were generated with dragon dictation software. Dictation errors may occur despite best attempts at proofreading.   Carrie Mew, MD 10/30/18 1248

## 2018-10-30 NOTE — ED Notes (Signed)
Patient is on the phone with her dad. Patient is tearful.

## 2018-10-31 MED ORDER — LORAZEPAM 1 MG PO TABS
1.0000 mg | ORAL_TABLET | Freq: Once | ORAL | Status: AC
  Administered 2018-10-31: 11:00:00 1 mg via ORAL
  Filled 2018-10-31: qty 1

## 2018-10-31 NOTE — BH Assessment (Signed)
Pt information re faxed to [facility].  Will, [facility] rep said to use [phone]

## 2018-10-31 NOTE — ED Notes (Signed)
Pt given a ginger ale and meal tray

## 2018-10-31 NOTE — Discharge Instructions (Addendum)
Please follow up with [facility] tomorrow morning.

## 2018-10-31 NOTE — ED Notes (Signed)
Pt requesting for medication for detox from heroin. MD Archie Balboa aware. Orders placed.

## 2018-10-31 NOTE — ED Notes (Signed)
Pt using phone to talk to friend. Pt has room at [facility] tomorrow.

## 2018-10-31 NOTE — BH Assessment (Signed)
Writer spoke with [name] (Ms. Worely-[phone]) and they will have a bed for the patient tomorrow (11/01/2018) after 7am.  Writer updated ER MD (Dr. Archie Balboa).

## 2018-10-31 NOTE — BH Assessment (Addendum)
Writer updated patient about bed available bed tomorrow (11/01/2018) with [facility]. She states she will discharge home and follow-up with them tomorrow. Writer provided the patient with the address and phone number for the facility.  Patient denies SI/HI and AV/H.

## 2019-12-19 ENCOUNTER — Other Ambulatory Visit: Payer: Self-pay

## 2019-12-19 ENCOUNTER — Encounter (HOSPITAL_COMMUNITY): Payer: Self-pay | Admitting: Psychiatry

## 2019-12-19 ENCOUNTER — Ambulatory Visit (INDEPENDENT_AMBULATORY_CARE_PROVIDER_SITE_OTHER): Payer: Medicaid Other | Admitting: Psychiatry

## 2019-12-19 DIAGNOSIS — F339 Major depressive disorder, recurrent, unspecified: Secondary | ICD-10-CM | POA: Insufficient documentation

## 2019-12-19 DIAGNOSIS — F3342 Major depressive disorder, recurrent, in full remission: Secondary | ICD-10-CM | POA: Insufficient documentation

## 2019-12-19 DIAGNOSIS — F902 Attention-deficit hyperactivity disorder, combined type: Secondary | ICD-10-CM | POA: Insufficient documentation

## 2019-12-19 MED ORDER — AMPHETAMINE-DEXTROAMPHET ER 15 MG PO CP24: 15.0000 mg | ORAL_CAPSULE | ORAL | 0 refills | Status: DC

## 2019-12-19 NOTE — Progress Notes (Signed)
Psychiatric Initial Adult Assessment   Patient Identification: Doris Roberson MRN:  [id number] Date of Evaluation:  12/19/2019   Referral Source: Vesta Mixer  Chief Complaint:   " I have ADHD and I want to be back on Adderall."  Visit Diagnosis:    ICD-10-CM   1. Attention deficit hyperactivity disorder (ADHD), combined type  F90.2   2. Major depressive disorder, recurrent episode with anxious distress (HCC)  F33.9     History of Present Illness: This is a 29 year old female with history of polysubstance dependence, depression versus bipolar disorder, ADHD now seen for evaluation and establishing care. Patient reported that she has long history of ADHD and was on stimulants namely Adderall throughout her high school as well as after school.  She stopped taking it about 3 years ago.  She stated that in January 2020 she messed up and started using opioids namely heroin, pain pills, amphetamines, benzodiazepines.  She stated that after having several visits to the ED she finally stopped in October 2020.  She was placed on Suboxone for a few weeks which she stopped taking in October 2020 as well. She said that she has been sober since January 01, 2019 and has no intention to go back to using any illicit substances. She stated that she has a 67-year-old son whose been taking care of her by her parents.  She stated that she temporarily gave the custody to her parents however now things are okay between all of them.  She resides with her parents along with her 52-year-old son. 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  She stated that she really loves her job and wants to continue this. She stated however she has been having a lot of difficulty in staying focused and that causes her to feel very anxious.  She has a hard time staying on task and accomplishing the tasks in a timely fashion.  She gets distracted easily.  She also  reported feeling fidgety and not being able to wait for return at times. She stated that she has no intention to misuse anything and really wants to get back on her Adderall.  She stated that she used to take Adderall 20 mg twice daily, last time she was prescribed Adderall was more than 3 years ago. I checked PDMP and was able to check the records past 6 years.  Noted that she was not prescribed Adderall in the last 6 years however she was prescribed codeine syrup quite frequently in 2016 and 2017.  She has been intermittently prescribed some oxycodone and hydrocodone as well as lorazepam in the past.  Writer shared her apprehensions about prescribing any controlled substances like stimulants to her.  Patient stated that she has never misused any of her prescribed medications.  She stated that she has tried Theatre manager but in her to be too helpful. With great Teacher, English as a foreign language agreed to prescribe her with extended release form of Adderall at a low-dose of 15 mg.  Writer educated the patient not to misuse it and also explained to her that it is very likely that she will be undergoing random urine drug screen tests at her current employment. Patient verbalized understanding and reassured that she will not be misusing anything.  She informed that she is currently prescribed Zoloft 100 mg and hydroxyzine 20 mg 3 times daily as needed by her primary care provider with [facility].  She  stated that Zoloft helps however she does have breakthrough anxiety. She informed that she has a court hearing with her baby's father scheduled for later this week and she is quite stressed about that.  She stated that in the recent past her baby's father had called CPS on her 2 days after she started her new job. She stated that hydroxyzine does not help much and she is also taken Buspirone in the past but that also was not helpful.  She stated that buspirone caused her to have weight gain and she does not want to  try that again. She reluctantly asked if she can be prescribed lorazepam and the writer told her that was going to happen given her history of significant substance addiction.   Past Psychiatric History: Bipolar disorder versus MDD, polysubstance dependence,  Previous Psychotropic Medications: Yes , Has been prescribed Zoloft, Wellbutrin, Seroquel, Remeron, trazodone in the past.  Was also prescribed Suboxone in October 2020.  Substance Abuse History in the last 12 months:  No.  Consequences of Substance Abuse: Negative  Past Medical History:  Past Medical History:  Diagnosis Date  . Alcohol abuse    s/p inpatient rehab at [facility] in 2011, [place]  . Depression    h/o Tylenol pm overdose, subsequent admision to behavioral health  . Mood disorder [facility])     Past Surgical History:  Procedure Laterality Date  . ANTERIOR CRUCIATE LIGAMENT REPAIR Left 2008  . CESAREAN SECTION N/A 05/07/2016   Procedure: CESAREAN SECTION;  Surgeon: Christeen Douglas, MD;  Location: [facility] ORS;  Service: Obstetrics;  Laterality: N/A;  Female born @ [birth year] Apgars: 9/9 Weight:7lb 11oz    Family Psychiatric History: denied  Family History:  Family History  Problem Relation Age of Onset  . Alcohol abuse Other   . Heart disease Father     Social History:   Social History   Socioeconomic History  . Marital status: Single    Spouse name: Not on file  . Number of children: Not on file  . Years of education: Not on file  . Highest education level: Not on file  Occupational History  . Not on file  Tobacco Use  . Smoking status: Current Every Day Smoker    Packs/day: 0.50  . Smokeless tobacco: Never Used  Substance and Sexual Activity  . Alcohol use: No  . Drug use: No  . Sexual activity: Yes  Other Topics Concern  . Not on file  Social History Narrative  . Not on file   Social Determinants of Health   Financial Resource Strain:   . Difficulty of Paying Living Expenses: Not on file   Food Insecurity:   . Worried About Programme researcher, broadcasting/film/video in the Last Year: Not on file  . Ran Out of Food in the Last Year: Not on file  Transportation Needs:   . Lack of Transportation (Medical): Not on file  . Lack of Transportation (Non-Medical): Not on file  Physical Activity:   . Days of Exercise per Week: Not on file  . Minutes of Exercise per Session: Not on file  Stress:   . Feeling of Stress : Not on file  Social Connections:   . Frequency of Communication with Friends and Family: Not on file  . Frequency of Social Gatherings with Friends and Family: Not on file  . Attends Religious Services: Not on file  . Active Member of Clubs or Organizations: Not on file  . Attends Banker Meetings:  Not on file  . 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  Allergies:   Allergies  Allergen Reactions  . Other Other (See Comments)    An abx, doesn't remember name. Stomach upset   . Nitrofurantoin Rash    Metabolic Disorder Labs: No results found for: HGBA1C, MPG No results found for: PROLACTIN No results found for: CHOL, TRIG, HDL, CHOLHDL, VLDL, LDLCALC Lab Results  Component Value Date   TSH 1.93 03/12/2010    Therapeutic Level Labs: No results found for: LITHIUM No results found for: CBMZ No results found for: VALPROATE  Current Medications: Current Outpatient Medications  Medication Sig Dispense Refill  . buPROPion (WELLBUTRIN XL) 150 MG 24 hr tablet Take 150 mg by mouth daily.    . cephALEXin (KEFLEX) 500 MG capsule Take 1 capsule (500 mg total) by mouth 4 (four) times daily. (Patient not taking: Reported on 10/05/2018) 20 capsule 0  . ferrous sulfate 325 (65 FE) MG tablet Take 1 tablet (325 mg total) by mouth daily with breakfast. (Patient not taking: Reported on 05/13/2018) 30 tablet 3  . fluticasone (FLONASE) 50 MCG/ACT nasal spray Place 2  sprays into both nostrils daily. (Patient not taking: Reported on 07/01/2018) 16 g 0  . hydrocortisone (ANUSOL-HC) 25 MG suppository Place 1 suppository (25 mg total) rectally 2 (two) times daily. (Patient not taking: Reported on 05/13/2018) 12 suppository 3  . hydrOXYzine (ATARAX/VISTARIL) 25 MG tablet Take 25 mg by mouth 3 (three) times daily.    Marland Kitchen ibuprofen (ADVIL,MOTRIN) 600 MG tablet Take 1 tablet (600 mg total) by mouth every 6 (six) hours. (Patient not taking: Reported on 05/13/2018) 30 tablet 0  . ketorolac (TORADOL) 10 MG tablet Take 1 tablet (10 mg total) by mouth every 8 (eight) hours. (Patient not taking: Reported on 05/13/2018) 15 tablet 0  . metoCLOPramide (REGLAN) 5 MG tablet Take 1 tablet (5 mg total) by mouth every 8 (eight) hours as needed for nausea or vomiting. (Patient not taking: Reported on 05/13/2018) 15 tablet 0  . mirtazapine (REMERON) 15 MG tablet Take 15 mg by mouth at bedtime.    Marland Kitchen oxyCODONE-acetaminophen (PERCOCET/ROXICET) 5-325 MG tablet Take 1-2 tablets by mouth every 4 (four) hours as needed (pain scale 4-7). (Patient not taking: Reported on 05/13/2018) 30 tablet 0  . oxymetazoline (AFRIN) 0.05 % nasal spray Place 1 spray into both nostrils 2 (two) times daily as needed for congestion.    . Prenatal Vit-Fe Fumarate-FA (PRENATAL MULTIVITAMIN) TABS tablet Take 1 tablet by mouth daily at 12 noon. (Patient not taking: Reported on 05/13/2018) 30 tablet 3  . QUEtiapine (SEROQUEL) 100 MG tablet Take 100 mg by mouth at bedtime.    . traZODone (DESYREL) 100 MG tablet Take 100 mg by mouth at bedtime.      No current facility-administered medications for this visit.    Musculoskeletal: Strength & Muscle Tone: within normal limits Gait & Station: normal Patient leans: N/A  Psychiatric Specialty Exam: Review of Systems  unknown if currently breastfeeding.There is no height or weight on file to calculate BMI.  General Appearance: Well Groomed  Eye Contact:  Good  Speech:  Clear  and Coherent and Normal Rate  Volume:  Normal  Mood:  Anxious  Affect:  Congruent  Thought Process:  Goal Directed and Descriptions of Associations: Intact  Orientation:  Full (Time, Place, and Person)  Thought Content:  Logical  Suicidal Thoughts:  No  Homicidal Thoughts:  No  Memory:  Immediate;   Good Recent;   Good  Judgement:  Fair  Insight:  Fair  Psychomotor Activity:  Normal  Concentration:  Concentration: Good and Attention Span: Good  Recall:  Good  Fund of Knowledge:Good  Language: Good  Akathisia:  Negative  Handed:  Right  AIMS (if indicated)not done  Assets:  Communication Skills Desire for Improvement Financial Resources/Insurance Housing  ADL's:  Intact  Cognition: WNL  Sleep:  Fair     Assessment and Plan: 29 year old female with history of MDD with anxiety, ADHD, polysubstance dependence about 12 months sober now presenting for prescription for Adderall.  Patient stated that she has been sober for about a year now and recently got a job and really wants to get back on Adderall.  She used to be prescribed Adderall 20 mg twice daily however PDMP checked for last 6 years and did not see any prescriptions for Adderall on the PDMP database.  Patient stated that this job really means a lot to her and due to her distractibility she may lose her job which will cause a lot of stress on her.  Writer reluctantly agreed to prescribe her extended release form of Adderall and informed her not to misuse the medication.  Patient stated that she plans to mainly use it for work days only.  She really wants to do well for the sake of her 473-year-old son.  She has a custody battle going on with her baby's father has a court hearing scheduled for later this week.  1. Attention deficit hyperactivity disorder (ADHD), combined type  - amphetamine-dextroamphetamine (ADDERALL XR) 15 MG 24 hr capsule; Take 1 capsule by mouth every morning.  Dispense: 30 capsule; Refill: 0 -  amphetamine-dextroamphetamine (ADDERALL XR) 15 MG 24 hr capsule; Take 1 capsule by mouth every morning.  Dispense: 30 capsule; Refill: 0  2. Major depressive disorder, recurrent episode with anxious distress (HCC)  -Continue Zoloft 100 mg daily -Continue hydroxyzine 25 mg 3 times daily as needed  F/up in 2 months.  Zena AmosMandeep Darielle Hancher, MD 9/20/202110:18 AM

## 2020-01-16 ENCOUNTER — Telehealth (HOSPITAL_COMMUNITY): Payer: Self-pay | Admitting: *Deleted

## 2020-01-16 DIAGNOSIS — F902 Attention-deficit hyperactivity disorder, combined type: Secondary | ICD-10-CM

## 2020-01-16 MED ORDER — AMPHETAMINE-DEXTROAMPHET ER 15 MG PO CP24: 15.0000 mg | ORAL_CAPSULE | ORAL | 0 refills | Status: DC

## 2020-01-16 NOTE — Telephone Encounter (Signed)
Call from [facility] stating she is off work for the next three days and will be out of this area and would like her Adderall Rx be available for her to pick up today. States when she spoke with Dr Evelene Croon she said it would be available for today the 18th but Rx shows the 20. Will ask Dr Evelene Croon if that date could be changed.

## 2020-01-16 NOTE — Telephone Encounter (Signed)
New Rx sent.

## 2020-01-16 NOTE — Addendum Note (Signed)
Addended by: Zena Amos on: 01/16/2020 10:02 AM   Modules accepted: Orders

## 2020-02-15 ENCOUNTER — Ambulatory Visit (INDEPENDENT_AMBULATORY_CARE_PROVIDER_SITE_OTHER): Payer: Medicaid Other | Admitting: Psychiatry

## 2020-02-15 ENCOUNTER — Encounter (HOSPITAL_COMMUNITY): Payer: Self-pay | Admitting: Psychiatry

## 2020-02-15 ENCOUNTER — Other Ambulatory Visit: Payer: Self-pay

## 2020-02-15 VITALS — BP 118/70 | HR 72 | Ht 66.0 in | Wt 145.0 lb

## 2020-02-15 DIAGNOSIS — F339 Major depressive disorder, recurrent, unspecified: Secondary | ICD-10-CM | POA: Diagnosis not present

## 2020-02-15 DIAGNOSIS — F902 Attention-deficit hyperactivity disorder, combined type: Secondary | ICD-10-CM | POA: Diagnosis not present

## 2020-02-15 MED ORDER — AMPHETAMINE-DEXTROAMPHETAMINE 20 MG PO TABS: 20.0000 mg | ORAL_TABLET | Freq: Two times a day (BID) | ORAL | 0 refills | Status: DC

## 2020-02-15 NOTE — Progress Notes (Signed)
Psychiatric Initial Adult Assessment   Patient Identification: Doris Roberson MRN:  [id number] Date of Evaluation:  02/15/2020   Referral Source: Vesta Mixer  Chief Complaint:  " I have another court hearing next week."  Visit Diagnosis:    ICD-10-CM   1. Major depressive disorder, recurrent episode with anxious distress (HCC)  F33.9   2. Attention deficit hyperactivity disorder (ADHD), combined type  F90.2     History of Present Illness: Patient presents today for follow-up evaluation for medication management.  She has a history of ADHD and she is currently managed on Adderall XR 15 mg in the morning.  She is also prescribed Seroquel, Zoloft, and Hydroxyzine by her primary care provider.  However, today the patient reports that she has not noticed any significant changes in her concentration or attention since her last visit.   She endorses ongoing symptoms of inattention, poor concentration, and difficulty focusing.  She reports that she is unsure if Adderall XR is helpful.  She feels as though in the past her Adderall was more effective in the immediate release formulation.  Patient has requested to switch to immediate release Adderall instead of extended-release Adderall XR.  Patient is agreeable to discontinue Adderall XR 15 mg in the morning and start Adderall IR 20 mg twice daily to target symptoms of ADHD.     Patient reports that she has been more stressed lately related to the custody battle with her son's father.  She reports that at this time, her son lives with her but his father continues to call child protective services to their home periodically which she considers frustrating and nerve-wracking.  She reports that she is currently preparing herself to attend court next week to further discuss the custody agreement.     Patient denies any additional concerns at this time.    Past Psychiatric History: Bipolar disorder versus MDD, polysubstance dependence,  Previous  Psychotropic Medications: Yes , Has been prescribed Zoloft, Wellbutrin, Seroquel, Remeron, trazodone in the past.  Was also prescribed Suboxone in October 2020.  Substance Abuse History in the last 12 months:  No.  Consequences of Substance Abuse: Negative  Past Medical History:  Past Medical History:  Diagnosis Date  . Alcohol abuse    s/p inpatient rehab at [facility] in 2011, two [place]  . Depression    h/o Tylenol pm overdose, subsequent admision to behavioral health  . Mood disorder [facility])     Past Surgical History:  Procedure Laterality Date  . ANTERIOR CRUCIATE LIGAMENT REPAIR Left 2008  . CESAREAN SECTION N/A 05/07/2016   Procedure: CESAREAN SECTION;  Surgeon: Christeen Douglas, MD;  Location: [facility] ORS;  Service: Obstetrics;  Laterality: N/A;  Female born @ 43 Apgars: 9/9 Weight:7lb 11oz    Family Psychiatric History: denied  Family History:  Family History  Problem Relation Age of Onset  . Alcohol abuse Other   . Heart disease Father     Social History:   Social History   Socioeconomic History  . Marital status: Single    Spouse name: Not on file  . Number of children: Not on file  . Years of education: Not on file  . Highest education level: Not on file  Occupational History  . Not on file  Tobacco Use  . Smoking status: Current Every Day Smoker    Packs/day: 0.50  . Smokeless tobacco: Never Used  Substance and Sexual Activity  . Alcohol use: No  . Drug use: No  . Sexual activity: Yes  Other Topics Concern  . Not on file  Social History Narrative  . Not on file   Social Determinants of Health   Financial Resource Strain:   . Difficulty of Paying Living Expenses: Not on file  Food Insecurity:   . Worried About Programme researcher, broadcasting/film/video in the Last Year: Not on file  . Ran Out of Food in the Last Year: Not on file  Transportation Needs:   . Lack of Transportation (Medical): Not on file  . Lack of Transportation (Non-Medical): Not on file  Physical  Activity:   . Days of Exercise per Week: Not on file  . Minutes of Exercise per Session: Not on file  Stress:   . Feeling of Stress : Not on file  Social Connections:   . Frequency of Communication with Friends and Family: Not on file  . Frequency of Social Gatherings with Friends and Family: Not on file  . Attends Religious Services: Not on file  . Active Member of Clubs or Organizations: Not on file  . Attends Banker Meetings: Not on file  . 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  Allergies:   Allergies  Allergen Reactions  . Other Other (See Comments)    An abx, doesn't remember name. Stomach upset   . Nitrofurantoin Rash    Metabolic Disorder Labs: No results found for: HGBA1C, MPG No results found for: PROLACTIN No results found for: CHOL, TRIG, HDL, CHOLHDL, VLDL, LDLCALC Lab Results  Component Value Date   TSH 1.93 03/12/2010    Therapeutic Level Labs: No results found for: LITHIUM No results found for: CBMZ No results found for: VALPROATE  Current Medications: Current Outpatient Medications  Medication Sig Dispense Refill  . amphetamine-dextroamphetamine (ADDERALL XR) 15 MG 24 hr capsule Take 1 capsule by mouth every morning. 30 capsule 0  . cephALEXin (KEFLEX) 500 MG capsule Take 1 capsule (500 mg total) by mouth 4 (four) times daily. 20 capsule 0  . hydrOXYzine (ATARAX/VISTARIL) 25 MG tablet Take 25 mg by mouth 3 (three) times daily.    Marland Kitchen ketorolac (TORADOL) 10 MG tablet Take 1 tablet (10 mg total) by mouth every 8 (eight) hours. 15 tablet 0  . QUEtiapine (SEROQUEL) 100 MG tablet Take 100 mg by mouth at bedtime.     No current facility-administered medications for this visit.    Musculoskeletal: Strength & Muscle Tone: within normal limits Gait & Station: normal Patient leans: N/A  Psychiatric Specialty Exam: Review of  Systems  Blood pressure 118/70, pulse 72, height 5\' 6"  (1.676 m), weight 145 lb (65.8 kg), SpO2 100 %, unknown if currently breastfeeding.Body mass index is 23.4 kg/m.  General Appearance: Well Groomed  Eye Contact:  Good  Speech:  Clear and Coherent and Normal Rate  Volume:  Normal  Mood:  Anxious  Affect:  Congruent  Thought Process:  Goal Directed and Descriptions of Associations: Intact  Orientation:  Full (Time, Place, and Person)  Thought Content:  Logical  Suicidal Thoughts:  No  Homicidal Thoughts:  No  Memory:  Immediate;   Good Recent;   Good  Judgement:  Fair  Insight:  Fair  Psychomotor Activity:  Normal  Concentration:  Concentration: Good and Attention Span: Good  Recall:  Good  Fund of Knowledge:Good  Language: Good  Akathisia:  Negative  Handed:  Right  AIMS (if indicated)not done  Assets:  Communication Skills Desire for Improvement Financial Resources/Insurance Housing  ADL's:  Intact  Cognition: WNL  Sleep:  Fair     Assessment and Plan: Pt is undergoing stress due to upcoming court date regarding son's custody. She did not Adderall XR to be helpful and would like to switch to Adderall IR 20 mg BID as she has a better response to it in the past.  1. Major depressive disorder, recurrent episode with anxious distress (HCC)  - Continue Seroquel 100 mg HS. --Continue Zoloft 100 mg daily -Continue hydroxyzine 25 mg 3 times daily as needed. The above 3 meds are prescribed by her PCP.    2. Attention deficit hyperactivity disorder (ADHD), combined type  - Discontinue Adderall XR due to lack of efficacy. - Start Adderall 20 mg BID.   F/up in 2 months.  Zena Amos, MD 11/17/202110:01 AM

## 2020-04-12 ENCOUNTER — Encounter (HOSPITAL_COMMUNITY): Payer: Self-pay | Admitting: Psychiatry

## 2020-04-12 ENCOUNTER — Other Ambulatory Visit: Payer: Self-pay

## 2020-04-12 ENCOUNTER — Telehealth (INDEPENDENT_AMBULATORY_CARE_PROVIDER_SITE_OTHER): Payer: Medicaid Other | Admitting: Psychiatry

## 2020-04-12 DIAGNOSIS — F902 Attention-deficit hyperactivity disorder, combined type: Secondary | ICD-10-CM | POA: Diagnosis not present

## 2020-04-12 DIAGNOSIS — F339 Major depressive disorder, recurrent, unspecified: Secondary | ICD-10-CM

## 2020-04-12 MED ORDER — AMPHETAMINE-DEXTROAMPHETAMINE 30 MG PO TABS: 30.0000 mg | ORAL_TABLET | Freq: Every morning | ORAL | 0 refills | Status: DC

## 2020-04-12 MED ORDER — AMPHETAMINE-DEXTROAMPHETAMINE 20 MG PO TABS: 20.0000 mg | ORAL_TABLET | Freq: Every evening | ORAL | 0 refills | Status: DC

## 2020-04-12 NOTE — Progress Notes (Signed)
BH OP Progress Note   Patient Identification: Doris Roberson MRN:  [id number] Date of Evaluation:  04/12/2020    Chief Complaint:  " I am doing well, the Adderall has helped."  Visit Diagnosis:    ICD-10-CM   1. Major depressive disorder, recurrent episode with anxious distress (HCC)  F33.9   2. Attention deficit hyperactivity disorder (ADHD), combined type  F90.2 amphetamine-dextroamphetamine (ADDERALL) 30 MG tablet    amphetamine-dextroamphetamine (ADDERALL) 30 MG tablet    amphetamine-dextroamphetamine (ADDERALL) 30 MG tablet    amphetamine-dextroamphetamine (ADDERALL) 20 MG tablet    amphetamine-dextroamphetamine (ADDERALL) 20 MG tablet    amphetamine-dextroamphetamine (ADDERALL) 20 MG tablet    History of Present Illness: Patient informed that she is doing well.  She stated that she has found Adderall to be more helpful than Adderall XR formulation.  She stated that she still fighting for the custody of her son in the court.  She has another court date hearing scheduled for end of this month.  Her son is doing well. She asked if her dose of Adderall could be increased by 10 mg.  Writer recommended that we can go up on the morning dose of Adderall to 30 mg for optimal effect and leave the afternoon dose at 20 mg.  Patient was agreeable to this recommendation.   Past Psychiatric History: Bipolar disorder versus MDD, polysubstance dependence,  Previous Psychotropic Medications: Yes , Has been prescribed Zoloft, Wellbutrin, Seroquel, Remeron, trazodone in the past.  Was also prescribed Suboxone in October 2020.  Substance Abuse History in the last 12 months:  No.  Consequences of Substance Abuse: Negative  Past Medical History:  Past Medical History:  Diagnosis Date  . Alcohol abuse    s/p inpatient rehab at [facility] in 2011, two [place]  . Depression    h/o Tylenol pm overdose, subsequent admision to behavioral health  . Mood disorder [facility])     Past Surgical  History:  Procedure Laterality Date  . ANTERIOR CRUCIATE LIGAMENT REPAIR Left 2008  . CESAREAN SECTION N/A 05/07/2016   Procedure: CESAREAN SECTION;  Surgeon: Christeen Douglas, MD;  Location: [facility] ORS;  Service: Obstetrics;  Laterality: N/A;  Female born @ 87 Apgars: 9/9 Weight:7lb 11oz    Family Psychiatric History: denied  Family History:  Family History  Problem Relation Age of Onset  . Alcohol abuse Other   . Heart disease Father     Social History:   Social History   Socioeconomic History  . Marital status: Single    Spouse name: Not on file  . Number of children: Not on file  . Years of education: Not on file  . Highest education level: Not on file  Occupational History  . Not on file  Tobacco Use  . Smoking status: Current Every Day Smoker    Packs/day: 0.50  . Smokeless tobacco: Never Used  Substance and Sexual Activity  . Alcohol use: No  . Drug use: No  . Sexual activity: Yes  Other Topics Concern  . Not on file  Social History Narrative  . 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  Allergies:   Allergies  Allergen Reactions  . Other Other (See Comments)    An abx, doesn't remember name. Stomach upset   . Nitrofurantoin Rash    Metabolic Disorder Labs: No results found for: HGBA1C, MPG No results found for: PROLACTIN No results found for: CHOL, TRIG, HDL, CHOLHDL, VLDL, LDLCALC Lab Results  Component Value Date   TSH 1.93 03/12/2010    Therapeutic Level Labs: No results found for: LITHIUM No results found for: CBMZ No results found for: VALPROATE  Current Medications: Current Outpatient Medications  Medication Sig  Dispense Refill  . amphetamine-dextroamphetamine (ADDERALL) 20 MG tablet Take 1 tablet (20 mg total) by mouth every evening. 30 tablet 0  . [START ON 05/12/2020] amphetamine-dextroamphetamine (ADDERALL) 20 MG tablet Take 1 tablet (20 mg total) by mouth every evening. 30 tablet 0  . [START ON 06/09/2020] amphetamine-dextroamphetamine (ADDERALL) 20 MG tablet Take 1 tablet (20 mg total) by mouth every evening. 30 tablet 0  . amphetamine-dextroamphetamine (ADDERALL) 30 MG tablet Take 1 tablet by mouth in the morning. 30 tablet 0  . [START ON 05/12/2020] amphetamine-dextroamphetamine (ADDERALL) 30 MG tablet Take 1 tablet by mouth in the morning. 30 tablet 0  . [START ON 06/09/2020] amphetamine-dextroamphetamine (ADDERALL) 30 MG tablet Take 1 tablet by mouth in the morning. 30 tablet 0  . cephALEXin (KEFLEX) 500 MG capsule Take 1 capsule (500 mg total) by mouth 4 (four) times daily. 20 capsule 0  . hydrOXYzine (ATARAX/VISTARIL) 25 MG tablet Take 25 mg by mouth 3 (three) times daily.    Marland Kitchen ketorolac (TORADOL) 10 MG tablet Take 1 tablet (10 mg total) by mouth every 8 (eight) hours. 15 tablet 0  . QUEtiapine (SEROQUEL) 100 MG tablet Take 100 mg by mouth at bedtime.     No current facility-administered medications for this visit.    Musculoskeletal: Strength & Muscle Tone: within normal limits Gait & Station: normal Patient leans: N/A  Psychiatric Specialty Exam: Review of Systems  unknown if currently breastfeeding.There is no height or weight on file to calculate BMI.  General Appearance: Well Groomed  Eye Contact:  Good  Speech:  Clear and Coherent and Normal Rate  Volume:  Normal  Mood:  Euthymic  Affect:  Congruent  Thought Process:  Goal Directed and Descriptions of Associations: Intact  Orientation:  Full (Time, Place, and Person)  Thought Content:  Logical  Suicidal Thoughts:  No  Homicidal Thoughts:  No  Memory:  Immediate;   Good Recent;   Good  Judgement:  Fair  Insight:  Fair   Psychomotor Activity:  Normal  Concentration:  Concentration: Good and Attention Span: Good  Recall:  Good  Fund of Knowledge:Good  Language: Good  Akathisia:  Negative  Handed:  Right  AIMS (if indicated)not done  Assets:  Communication Skills Desire for Improvement Financial Resources/Insurance Housing  ADL's:  Intact  Cognition: WNL  Sleep:  Fair     Assessment and Plan: Patient appears to be doing well, is not anxious like her first 2 sessions.  1. Major depressive disorder, recurrent episode with anxious distress (HCC)  - Continue Seroquel 100 mg HS. --Continue Zoloft 100 mg daily -Continue hydroxyzine 25 mg 3 times daily as needed. The above 3 meds are prescribed by her PCP.    2. Attention deficit hyperactivity disorder (ADHD), combined type - Increase amphetamine-dextroamphetamine (ADDERALL) 30 MG tablet; Take 1 tablet by mouth in the morning.  Dispense: 30 tablet; Refill: 0 - amphetamine-dextroamphetamine (ADDERALL) 30 MG tablet; Take 1 tablet by mouth in the morning.  Dispense: 30 tablet; Refill: 0 - amphetamine-dextroamphetamine (ADDERALL) 30 MG tablet; Take 1 tablet by mouth in the morning.  Dispense: 30 tablet; Refill: 0 - amphetamine-dextroamphetamine (ADDERALL) 20 MG tablet; Take 1 tablet (20 mg total) by mouth every evening.  Dispense: 30 tablet; Refill: 0 - amphetamine-dextroamphetamine (ADDERALL) 20 MG tablet; Take 1 tablet (20 mg total) by mouth every evening.  Dispense: 30 tablet; Refill: 0 - amphetamine-dextroamphetamine (ADDERALL) 20 MG tablet; Take 1 tablet (20 mg total) by mouth every evening.  Dispense: 30 tablet; Refill: 0    F/up in 3 months.  Zena Amos, MD 1/13/20229:48 AM

## 2020-05-01 ENCOUNTER — Telehealth (HOSPITAL_COMMUNITY): Payer: Self-pay | Admitting: *Deleted

## 2020-05-01 DIAGNOSIS — F902 Attention-deficit hyperactivity disorder, combined type: Secondary | ICD-10-CM

## 2020-05-01 MED ORDER — AMPHETAMINE-DEXTROAMPHETAMINE 30 MG PO TABS: 30.0000 mg | ORAL_TABLET | Freq: Every morning | ORAL | 0 refills | Status: DC

## 2020-05-01 MED ORDER — AMPHETAMINE-DEXTROAMPHETAMINE 20 MG PO TABS: 20.0000 mg | ORAL_TABLET | Freq: Every evening | ORAL | 0 refills | Status: DC

## 2020-05-01 NOTE — Telephone Encounter (Signed)
Opened chart a second time in error.  

## 2020-05-01 NOTE — Telephone Encounter (Signed)
A prescription for 2 weeks issued. This is the first and last time such a request will be honored.

## 2020-05-01 NOTE — Addendum Note (Signed)
Addended by: Zena Amos on: 05/01/2020 01:31 PM   Modules accepted: Orders

## 2020-05-01 NOTE — Telephone Encounter (Signed)
Requesting rx of Adderall until she can fill the 05/13/20 rx that is on hold at her pharmacy because her mom reportedly flushed her Adderall down the commode recently. She stated her mom is very against medications and when she found out she was taking the Adderall, patient had not told her she flushed. Called the pharmacy and she last picked up both strengths of Adderall on 1/13. She doesn't have a hx at the pharmacy of early fills. Her next rx can be picked up on 2/12. Will ask Dr Evelene Croon if she would writer her a two week rx for her Adderalls till her onhold rx is available to pick up.

## 2020-07-05 ENCOUNTER — Other Ambulatory Visit: Payer: Self-pay

## 2020-07-05 ENCOUNTER — Telehealth (INDEPENDENT_AMBULATORY_CARE_PROVIDER_SITE_OTHER): Payer: Medicaid Other | Admitting: Psychiatry

## 2020-07-05 ENCOUNTER — Encounter (HOSPITAL_COMMUNITY): Payer: Self-pay | Admitting: Psychiatry

## 2020-07-05 DIAGNOSIS — F902 Attention-deficit hyperactivity disorder, combined type: Secondary | ICD-10-CM

## 2020-07-05 MED ORDER — AMPHETAMINE-DEXTROAMPHETAMINE 20 MG PO TABS: 20.0000 mg | ORAL_TABLET | Freq: Every evening | ORAL | 0 refills | Status: DC

## 2020-07-05 MED ORDER — AMPHETAMINE-DEXTROAMPHETAMINE 30 MG PO TABS
30.0000 mg | ORAL_TABLET | Freq: Every morning | ORAL | 0 refills | Status: DC
Start: 2020-07-05 — End: 2020-10-03

## 2020-07-05 MED ORDER — AMPHETAMINE-DEXTROAMPHETAMINE 30 MG PO TABS
30.0000 mg | ORAL_TABLET | Freq: Every morning | ORAL | 0 refills | Status: DC
Start: 2020-09-03 — End: 2020-10-04

## 2020-07-05 MED ORDER — AMPHETAMINE-DEXTROAMPHETAMINE 30 MG PO TABS: 30.0000 mg | ORAL_TABLET | Freq: Every morning | ORAL | 0 refills | Status: DC

## 2020-07-05 NOTE — Progress Notes (Signed)
BH OP Progress Note   Virtual Visit via Video Note  I connected with Doris Roberson on 07/05/20 at 11:20 AM EDT by a video enabled telemedicine application and verified that I am speaking with the correct person using two identifiers.  Location: Patient: Work/Office Provider: Clinic   I discussed the limitations of evaluation and management by telemedicine and the availability of in person appointments. The patient expressed understanding and agreed to proceed.  I provided 15 minutes of non-face-to-face time during this encounter.     Patient Identification: Doris Roberson MRN:  [id number] Date of Evaluation:  07/05/2020    Chief Complaint:  " I am doing okay."  Visit Diagnosis:    ICD-10-CM   1. Attention deficit hyperactivity disorder (ADHD), combined type  F90.2 amphetamine-dextroamphetamine (ADDERALL) 30 MG tablet    amphetamine-dextroamphetamine (ADDERALL) 30 MG tablet    amphetamine-dextroamphetamine (ADDERALL) 30 MG tablet    amphetamine-dextroamphetamine (ADDERALL) 20 MG tablet    amphetamine-dextroamphetamine (ADDERALL) 20 MG tablet    amphetamine-dextroamphetamine (ADDERALL) 20 MG tablet    History of Present Illness: Patient reported that she is doing well.  She informed that Adderall at current doses helps her immensely.  However she mentioned that her mother keeps throwing away her medication bottles as soon as she finds them.  She stated that she did the same in February and she had to contact the writer's office for additional 2-week supply before the scheduled pickup date. She stated that she has been out of her medicines for the past 2 weeks once again because her mother did the same thing again this time.  She stated that her mother does not trust her with medications due to her history of substance abuse in the past. She stated that she wants her mother to understand but her mother is really difficult at times. She asked the writer if the writer would  be willing to give her additional few days supply until she can pick up the next prescription.  Writer informed her that this is not possible as this is very concerning due to the possibility of drug diversion. Patient stated that she understands.  She then stated that she plans to keep her medication bottles at work. Writer stated that can be very dangerous given the nature of the medication she is taking, Adderall being a controlled substance carries a very high risk of drug diversion and writer advised her not to leave them at work.  She stated that she plans to leave them in her drawer which can be locked.  Writer stated that that did not really sound like a good idea.   Past Psychiatric History: Bipolar disorder versus MDD, polysubstance dependence,  Previous Psychotropic Medications: Yes , Has been prescribed Zoloft, Wellbutrin, Seroquel, Remeron, trazodone in the past.  Was also prescribed Suboxone in October 2020.  Substance Abuse History in the last 12 months:  No.  Consequences of Substance Abuse: Negative  Past Medical History:  Past Medical History:  Diagnosis Date  . Alcohol abuse    s/p inpatient rehab at [facility] in 2011, two [place]  . Depression    h/o Tylenol pm overdose, subsequent admision to behavioral health  . Mood disorder [facility])     Past Surgical History:  Procedure Laterality Date  . ANTERIOR CRUCIATE LIGAMENT REPAIR Left 2008  . CESAREAN SECTION N/A 05/07/2016   Procedure: CESAREAN SECTION;  Surgeon: Christeen Douglas, MD;  Location: [facility] ORS;  Service: Obstetrics;  Laterality: N/A;  Female born @  [birth year] Apgars: 9/9 Weight:7lb 11oz    Family Psychiatric History: denied  Family History:  Family History  Problem Relation Age of Onset  . Alcohol abuse Other   . Heart disease Father     Social History:   Social History   Socioeconomic History  . Marital status: Single    Spouse name: Not on file  . Number of children: Not on file  . Years of  education: Not on file  . Highest education level: Not on file  Occupational History  . Not on file  Tobacco Use  . Smoking status: Current Every Day Smoker    Packs/day: 0.50  . Smokeless tobacco: Never Used  Substance and Sexual Activity  . Alcohol use: No  . Drug use: No  . Sexual activity: Yes  Other Topics Concern  . Not on file  Social History Narrative  . 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  Allergies:   Allergies  Allergen Reactions  . Other Other (See Comments)    An abx, doesn't remember name. Stomach upset   . Nitrofurantoin Rash    Metabolic Disorder Labs: No results found for: HGBA1C, MPG No results found for: PROLACTIN No results found for: CHOL, TRIG, HDL, CHOLHDL, VLDL, LDLCALC Lab Results  Component Value Date   TSH 1.93 03/12/2010    Therapeutic Level Labs: No results found for: LITHIUM No results found for: CBMZ No results found for: VALPROATE  Current Medications: Current Outpatient Medications  Medication Sig Dispense Refill  . amphetamine-dextroamphetamine (ADDERALL) 20 MG tablet Take 1 tablet (20 mg total) by mouth every evening. 30 tablet 0  . [START ON 08/03/2020] amphetamine-dextroamphetamine (ADDERALL) 20 MG tablet Take 1 tablet (20 mg total) by mouth every evening. 30 tablet 0  . [START ON 09/03/2020] amphetamine-dextroamphetamine (ADDERALL) 20 MG tablet Take 1 tablet (20 mg total) by mouth every evening. 30 tablet 0  . amphetamine-dextroamphetamine (ADDERALL) 30 MG tablet Take 1 tablet by mouth in the morning. 30 tablet 0  . [START ON 08/03/2020] amphetamine-dextroamphetamine (ADDERALL) 30 MG tablet Take 1 tablet by  mouth in the morning. 30 tablet 0  . [START ON 09/03/2020] amphetamine-dextroamphetamine (ADDERALL) 30 MG tablet Take 1 tablet by mouth in the morning. 30 tablet 0  . cephALEXin (KEFLEX) 500 MG capsule Take 1 capsule (500 mg total) by mouth 4 (four) times daily. 20 capsule 0  . hydrOXYzine (ATARAX/VISTARIL) 25 MG tablet Take 25 mg by mouth 3 (three) times daily.    Marland Kitchen ketorolac (TORADOL) 10 MG tablet Take 1 tablet (10 mg total) by mouth every 8 (eight) hours. 15 tablet 0  . QUEtiapine (SEROQUEL) 100 MG tablet Take 100 mg by mouth at bedtime.     No current facility-administered medications for this visit.    Musculoskeletal: Strength & Muscle Tone: within normal limits Gait & Station: normal Patient leans: N/A  Psychiatric Specialty Exam: Review of Systems  unknown if currently breastfeeding.There is no height or weight on file to calculate BMI.  General Appearance: Well Groomed  Eye Contact:  Good  Speech:  Clear and Coherent and Normal Rate  Volume:  Normal  Mood:  Euthymic  Affect:  Congruent  Thought Process:  Goal Directed and Descriptions of Associations: Intact  Orientation:  Full (Time, Place, and Person)  Thought Content:  Logical  Suicidal Thoughts:  No  Homicidal Thoughts:  No  Memory:  Immediate;   Good Recent;   Good  Judgement:  Fair  Insight:  Fair  Psychomotor Activity:  Normal  Concentration:  Concentration: Good and Attention Span: Good  Recall:  Good  Fund of Knowledge:Good  Language: Good  Akathisia:  Negative  Handed:  Right  AIMS (if indicated)not done  Assets:  Communication Skills Desire for Improvement Financial Resources/Insurance Housing  ADL's:  Intact  Cognition: WNL  Sleep:  [facility]/13/[facility]  C-SSRS RISK CATEGORY No Risk      Assessment and Plan: Patient reported that Adderall at current dose helps her immensely however her mother keeps throwing away the medication  bottles of her Adderall as she does not agree with the patient being on them.  She asked for additional refill until the next scheduled date for prescription refill.  Writer informed her that this request cannot be honored due to the high risk of drug diversion given the nature of the medication she is taking.  Patient stated that she understands and stated that she would just wait until the next refill is due.  1. Major depressive disorder, recurrent episode with anxious distress (HCC)  - Continue Seroquel 100 mg HS. --Continue Zoloft 100 mg daily -Continue hydroxyzine 25 mg 3 times daily as needed. The above 3 meds are prescribed by her PCP.    2. Attention deficit hyperactivity disorder (ADHD), combined type - Increase amphetamine-dextroamphetamine (ADDERALL) 30 MG tablet; Take 1 tablet by mouth in the morning.  Dispense: 30 tablet; Refill: 0 - amphetamine-dextroamphetamine (ADDERALL) 30 MG tablet; Take 1 tablet by mouth in the morning.  Dispense: 30 tablet; Refill: 0 - amphetamine-dextroamphetamine (ADDERALL) 30 MG tablet; Take 1 tablet by mouth in the morning.  Dispense: 30 tablet; Refill: 0 - amphetamine-dextroamphetamine (ADDERALL) 20 MG tablet; Take 1 tablet (20 mg total) by mouth every evening.  Dispense: 30 tablet; Refill: 0 - amphetamine-dextroamphetamine (ADDERALL) 20 MG tablet; Take 1 tablet (20 mg total) by mouth every evening.  Dispense: 30 tablet; Refill: 0 - amphetamine-dextroamphetamine (ADDERALL) 20 MG tablet; Take 1 tablet (20 mg total) by mouth every evening.  Dispense: 30 tablet; Refill: 0   Continue same regimen. F/up in 3 months.  Zena Amos, MD 4/7/202211:35 AM

## 2020-07-16 IMAGING — CR CHEST - 2 VIEW
2 series · 2 of 2 positions shown · non-contrast
Comparison: Chest radiograph dated 07/29/2009

CLINICAL DATA: 28-year-old female with left sided chest pain.

EXAM:
CHEST - 2 VIEW

[w chest pa]
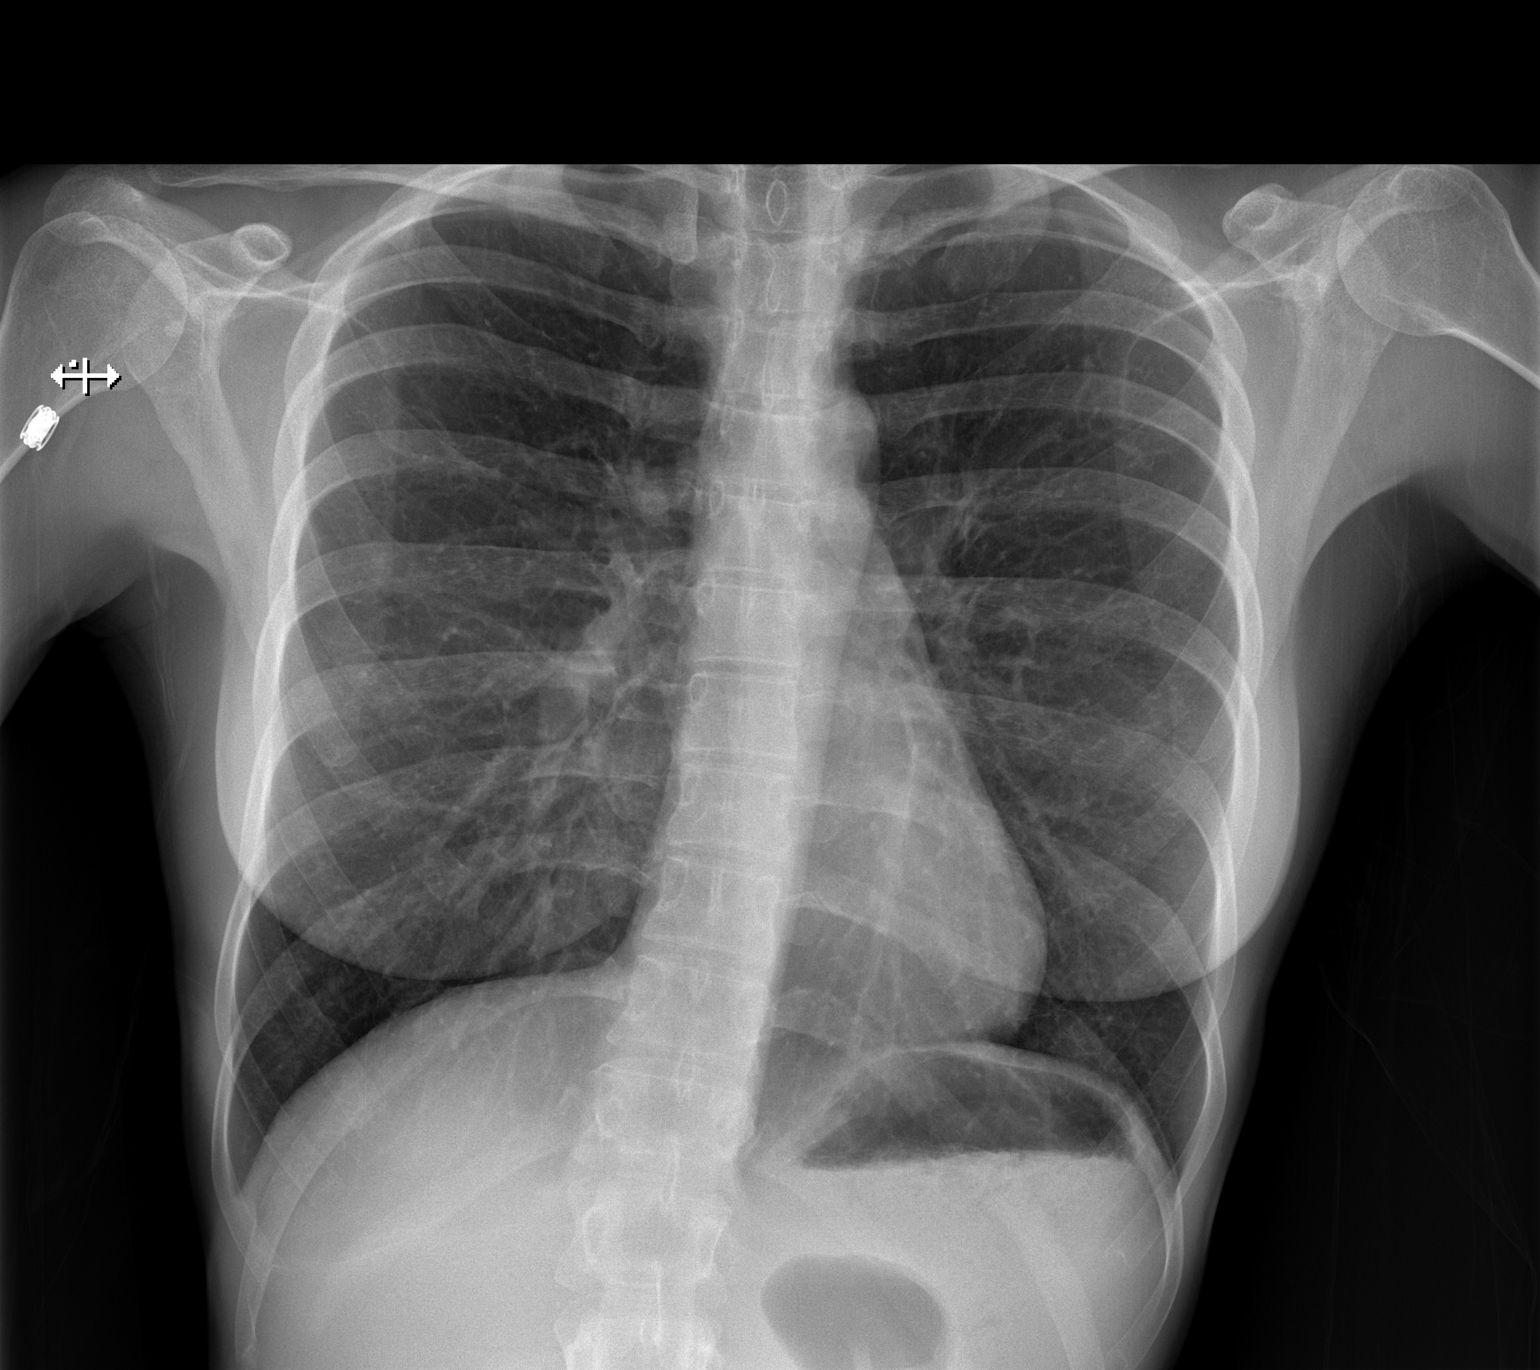

[w chest lat]
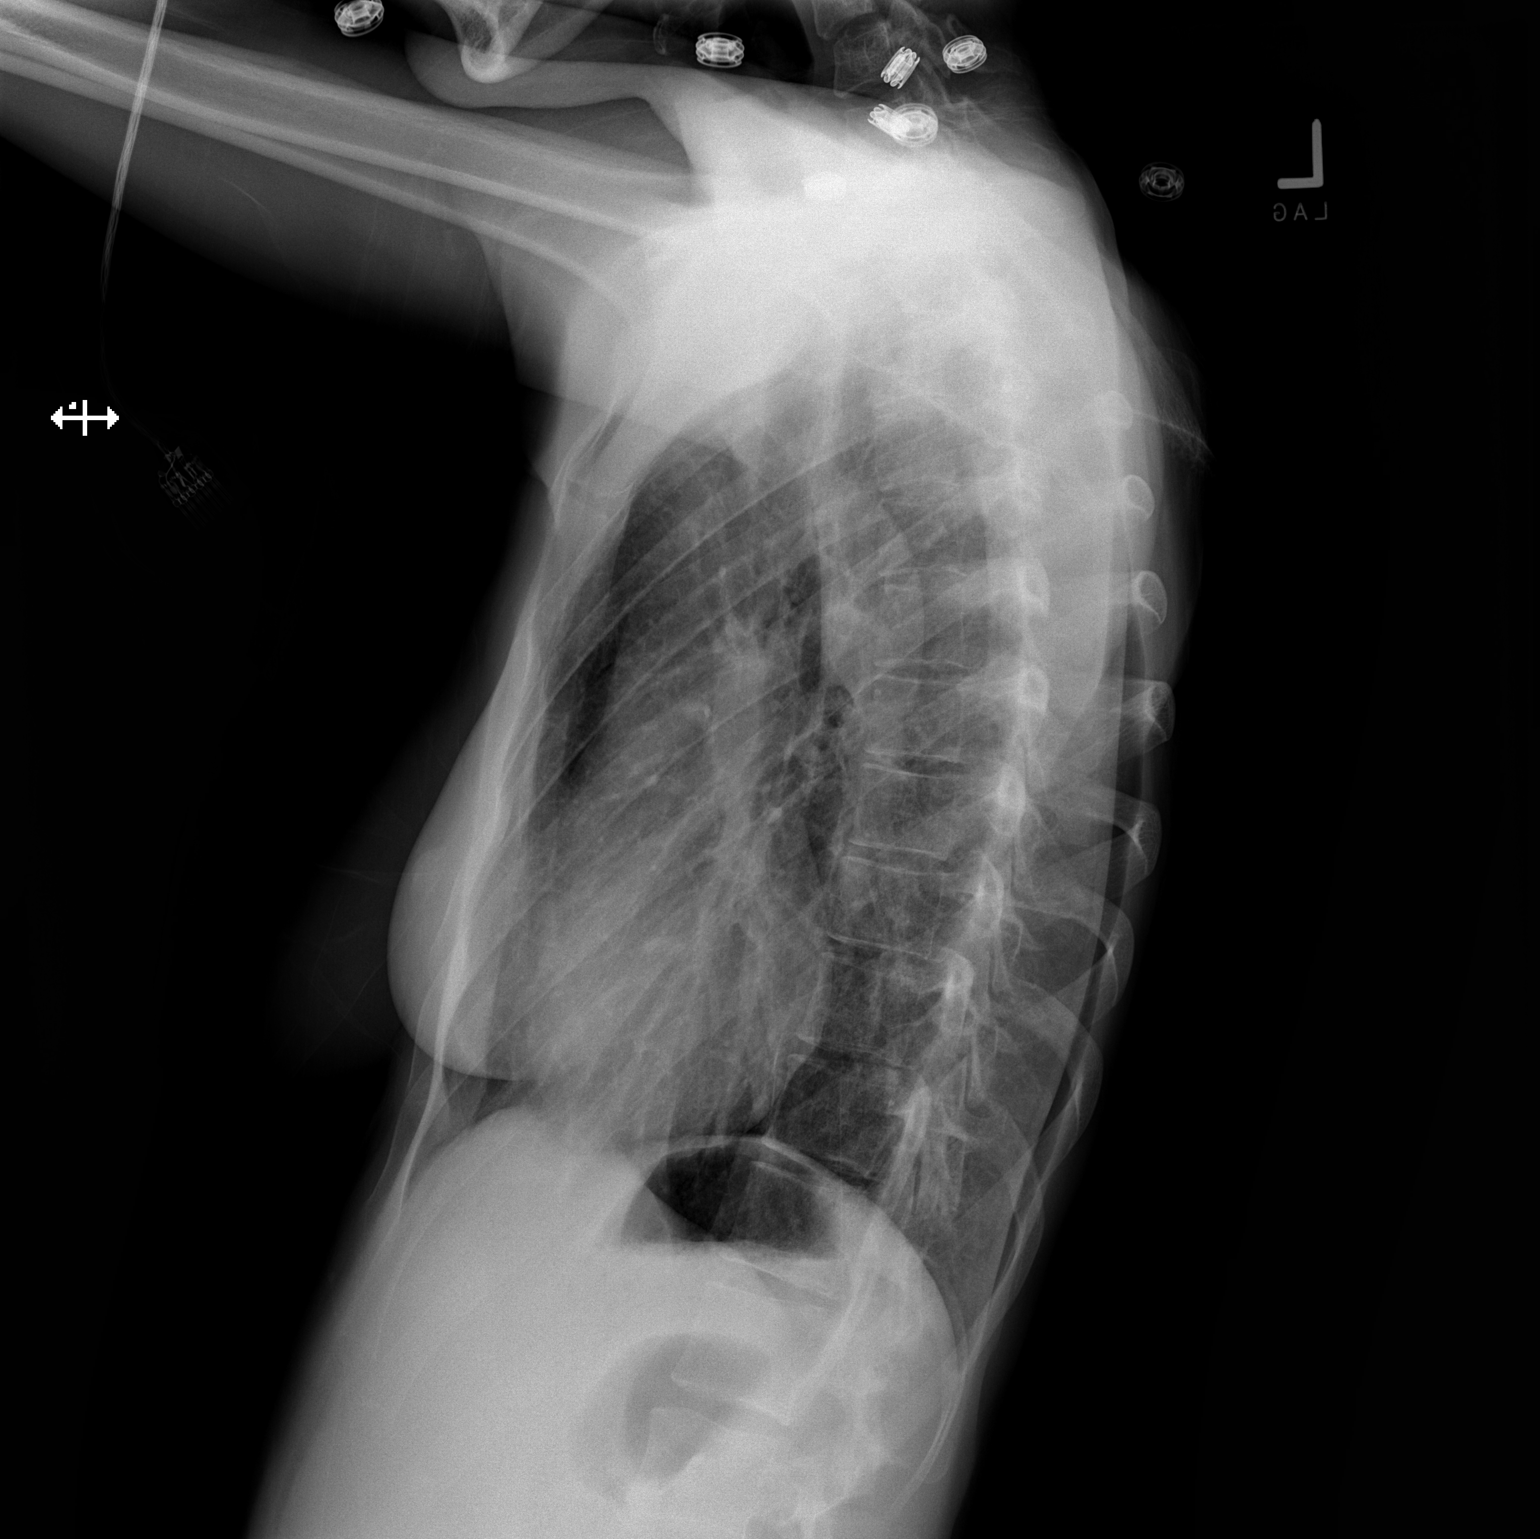

[2 of 2 positions shown; findings below may reference images not displayed]

FINDINGS: The heart size and mediastinal contours are within normal limits.
Both lungs are clear. The visualized skeletal structures are
unremarkable.
IMPRESSION: No active cardiopulmonary disease.

## 2020-08-02 ENCOUNTER — Other Ambulatory Visit: Payer: Self-pay | Admitting: Physician Assistant

## 2020-08-02 DIAGNOSIS — N6342 Unspecified lump in left breast, subareolar: Secondary | ICD-10-CM

## 2020-09-25 ENCOUNTER — Ambulatory Visit (HOSPITAL_COMMUNITY): Payer: Medicaid Other | Admitting: Psychiatry

## 2020-10-02 ENCOUNTER — Telehealth (HOSPITAL_COMMUNITY): Payer: Self-pay | Admitting: *Deleted

## 2020-10-02 ENCOUNTER — Telehealth (HOSPITAL_COMMUNITY): Payer: Self-pay | Admitting: Psychiatry

## 2020-10-02 NOTE — Telephone Encounter (Signed)
Pt was out of town for last appt with Evelene Croon and was unable to keep appt.  Pt requesting medication refill and appt with provider taking Kaur's place.

## 2020-10-02 NOTE — Telephone Encounter (Signed)
Request from patient for her Adderall rx to be called in. Dr Magdalen Spatz last progress note in April indicates her reporting her mom throws her Adderall away and she goes without it at times. Unsure of the truth of this. She missed her last appt. Dr Evelene Croon has left this practice so she will need to be transferred to one of the remaining providers. A new appt will be made for her and or a walk in appt can be offered to her, as she should be seen before a new rx is written for her stimulant.

## 2020-10-02 NOTE — Telephone Encounter (Signed)
Message was received and acknowledged by Provider.

## 2020-10-03 ENCOUNTER — Encounter (HOSPITAL_COMMUNITY): Payer: Self-pay | Admitting: Physician Assistant

## 2020-10-03 ENCOUNTER — Telehealth (INDEPENDENT_AMBULATORY_CARE_PROVIDER_SITE_OTHER): Payer: Medicaid Other | Admitting: Physician Assistant

## 2020-10-03 ENCOUNTER — Telehealth (HOSPITAL_COMMUNITY): Payer: Self-pay | Admitting: *Deleted

## 2020-10-03 DIAGNOSIS — F902 Attention-deficit hyperactivity disorder, combined type: Secondary | ICD-10-CM | POA: Diagnosis not present

## 2020-10-03 NOTE — Telephone Encounter (Signed)
Thank you for this update. You are correct. Before refills are made she will need to be scheduled with  a provider.

## 2020-10-03 NOTE — Telephone Encounter (Signed)
Call from patient stating she didn't realize she had to be seen to get her stimulant after she missed her last appt with Dr Evelene Croon and she has been scheduled with Link Snuffer PA as her new provider and has a virtual appt with him on 7/15. She was offered walk in appt tomorrow and explained the first come first serve if she feels she is not able to wait till the 15 th.

## 2020-10-03 NOTE — Progress Notes (Signed)
BH MD/PA/NP OP Progress Note  Virtual Visit via Telephone Note  I connected with Doris Roberson on 10/03/20 at 10:30 AM EDT by telephone and verified that I am speaking with the correct person using two identifiers.  Location: Patient: Home Provider: Clinic   I discussed the limitations, risks, security and privacy concerns of performing an evaluation and management service by telephone and the availability of in person appointments. I also discussed with the patient that there may be a patient responsible charge related to this service. The patient expressed understanding and agreed to proceed.  Follow Up Instructions:  I discussed the assessment and treatment plan with the patient. The patient was provided an opportunity to ask questions and all were answered. The patient agreed with the plan and demonstrated an understanding of the instructions.   The patient was advised to call back or seek an in-person evaluation if the symptoms worsen or if the condition fails to improve as anticipated.  I provided 20 minutes of non-face-to-face time during this encounter.  Meta Hatchet, PA   10/03/2020 9:56 PM Doris Roberson  MRN:  [id number]  Chief Complaint: Follow up and medication management  HPI:   Doris Roberson is a 30 year old female with a past psychiatric history significant for major depressive disorder and attention deficit hyperactivity disorder who presents to [facility] via virtual telephone visit for follow-up and medication management.  Patient is currently being managed on the following medications:  Seroquel 100 mg at bedtime Zoloft 100 mg daily Hydroxyzine 25 mg 3 times daily as needed Adderall 30 mg in the morning/20 mg in the evening  Patient reports no issues or concerns regarding her current medication regimen.  She reports that her medications are working well.  Patient denies the need for dosage adjustments  at this time and is only requesting refills on her Adderall.  Patient denies experiencing any depressive symptoms nor has she experienced severe anxiety as of late.  Patient generally rates her anxiety a 5 out of 10.  Patient denies any new stressors or life changing events at this time.  Patient denies major issues regarding her mental health.  Patient is pleasant, calm, cooperative, and fully engaged in conversation during the encounter.  Patient reports that she is in a good mood.  Patient denies suicidal or homicidal ideations.  She further denies auditory or visual hallucinations and does not appear to be responding to internal/external stimuli.  Patient endorses good sleep and receives on average 8 hours of sleep each night.  Patient endorses good appetite and eats on average 3 meals per day with snacking in between.  Patient denies alcohol consumption.  Patient endorses tobacco use and smokes on average 1/2 pack/day.  Patient denies illicit drug use.  Visit Diagnosis:    ICD-10-CM   1. Attention deficit hyperactivity disorder (ADHD), combined type  F90.2 amphetamine-dextroamphetamine (ADDERALL) 20 MG tablet    amphetamine-dextroamphetamine (ADDERALL) 30 MG tablet      Past Psychiatric History:  Bipolar disorder vs Major depressive disorder Polysubstance dependence   Past Medical History:  Past Medical History:  Diagnosis Date   Alcohol abuse    s/p inpatient rehab at [facility] in 2011, two DUI's   Depression    h/o Tylenol pm overdose, subsequent admision to behavioral health   Mood disorder [facility])     Past Surgical History:  Procedure Laterality Date   ANTERIOR CRUCIATE LIGAMENT REPAIR Left 2008   CESAREAN SECTION N/A 05/07/2016  Procedure: CESAREAN SECTION;  Surgeon: Christeen Douglas, MD;  Location: [facility] ORS;  Service: Obstetrics;  Laterality: N/A;  Female born @ [birth year] Apgars: 9/9 Weight:7lb 11oz    Family Psychiatric History:  Denied  Family History:  Family History   Problem Relation Age of Onset   Alcohol abuse Other    Heart disease Father     Social History:  Social History   Socioeconomic History   Marital status: Single    Spouse name: Not on file   Number of children: Not on file   Years of education: Not on file   Highest education level: Not on file  Occupational History   Not on file  Tobacco Use   Smoking status: Every Day    Packs/day: 0.50    Pack years: 0.00    Types: Cigarettes   Smokeless tobacco: Never  Substance and Sexual Activity   Alcohol use: No   Drug use: No   Sexual activity: Yes  Other Topics Concern   Not on file  Social History Narrative   Not on file   Social Determinants of Health   Financial Resource Strain: Not on file  Food Insecurity: Not on file  Transportation Needs: Not on file  Physical Activity: Not on file  Stress: Not on file  Social Connections: Not on file    Allergies:  Allergies  Allergen Reactions   Other Other (See Comments)    An abx, doesn't remember name. Stomach upset    Nitrofurantoin Rash    Metabolic Disorder Labs: No results found for: HGBA1C, MPG No results found for: PROLACTIN No results found for: CHOL, TRIG, HDL, CHOLHDL, VLDL, LDLCALC Lab Results  Component Value Date   TSH 1.93 03/12/2010    Therapeutic Level Labs: No results found for: LITHIUM No results found for: VALPROATE No components found for:  CBMZ  Current Medications: Current Outpatient Medications  Medication Sig Dispense Refill   amphetamine-dextroamphetamine (ADDERALL) 20 MG tablet Take 1 tablet (20 mg total) by mouth every evening. 30 tablet 0   amphetamine-dextroamphetamine (ADDERALL) 30 MG tablet Take 1 tablet by mouth in the morning. 30 tablet 0   cephALEXin (KEFLEX) 500 MG capsule Take 1 capsule (500 mg total) by mouth 4 (four) times daily. 20 capsule 0   hydrOXYzine (ATARAX/VISTARIL) 25 MG tablet Take 25 mg by mouth 3 (three) times daily.     ketorolac (TORADOL) 10 MG tablet Take  1 tablet (10 mg total) by mouth every 8 (eight) hours. 15 tablet 0   QUEtiapine (SEROQUEL) 100 MG tablet Take 100 mg by mouth at bedtime.     No current facility-administered medications for this visit.     Musculoskeletal: Strength & Muscle Tone: Unable to assess due to telemedicine visit Gait & Station: Unable to assess due to telemedicine visit Patient leans: Unable to assess due to telemedicine visit  Psychiatric Specialty Exam: Review of Systems  Psychiatric/Behavioral:  Negative for decreased concentration, dysphoric mood, hallucinations, self-injury, sleep disturbance and suicidal ideas. The patient is not nervous/anxious and is not hyperactive.    unknown if currently breastfeeding.There is no height or weight on file to calculate BMI.  General Appearance: Unable to assess due to telemedicine visit  Eye Contact:  Unable to assess due to telemedicine visit  Speech:  Clear and Coherent and Normal Rate  Volume:  Normal  Mood:  Euthymic  Affect:  Appropriate  Thought Process:  Coherent, Goal Directed, and Descriptions of Associations: Intact  Orientation:  Full (Time, Place, and  Person)  Thought Content: WDL   Suicidal Thoughts:  No  Homicidal Thoughts:  No  Memory:  Immediate;   Good Recent;   Good Remote;   Good  Judgement:  Fair  Insight:  Fair  Psychomotor Activity:  Normal  Concentration:  Concentration: Good and Attention Span: Good  Recall:  Good  Fund of Knowledge: Good  Language: Good  Akathisia:  Negative  Handed:  Right  AIMS (if indicated): not done  Assets:  Communication Skills Desire for Improvement Financial Resources/Insurance Housing  ADL's:  Intact  Cognition: WNL  Sleep:  Good   Screenings: GAD-7    Flowsheet Row Video Visit from 10/03/2020 in [facility]  Total GAD-7 Score 7      PHQ2-9    Flowsheet Row Video Visit from 10/03/2020 in [facility]  PHQ-2 Total Score 2  PHQ-9 Total  Score 8      Flowsheet Row Video Visit from 10/03/2020 in [facility] ED from 05/13/2018 [facility]  C-SSRS RISK CATEGORY No Risk No Risk        Assessment and Plan:   Doris Roberson is a 30 year old female with a past psychiatric history significant for major depressive disorder and attention deficit hyperactivity disorder who presents to [facility] via virtual telephone visit for follow-up and medication management.  Patient reports no issues or concerns regarding her current medication regimen.  Patient denies the need for dosage adjustments at this time and is requesting refills on her Adderall.  Patient states that her medications are working perfectly fine at this time.  Patient's medications to be e-prescribed to pharmacy of choice.  1. Attention deficit hyperactivity disorder (ADHD), combined type  - amphetamine-dextroamphetamine (ADDERALL) 20 MG tablet; Take 1 tablet (20 mg total) by mouth every evening.  Dispense: 30 tablet; Refill: 0 - amphetamine-dextroamphetamine (ADDERALL) 30 MG tablet; Take 1 tablet by mouth in the morning.  Dispense: 30 tablet; Refill: 0  Patient to follow up in 3 months Provider spent a total of 20 minutes with the patient/reviewing patient's chart  Meta Hatchet, PA 10/03/2020, 9:56 PM

## 2020-10-03 NOTE — Telephone Encounter (Signed)
Appointment for patient has been scheduled by Earnestine Mealing.

## 2020-10-04 MED ORDER — AMPHETAMINE-DEXTROAMPHETAMINE 20 MG PO TABS: 20.0000 mg | ORAL_TABLET | Freq: Every evening | ORAL | 0 refills | Status: DC

## 2020-10-04 MED ORDER — AMPHETAMINE-DEXTROAMPHETAMINE 30 MG PO TABS: 30.0000 mg | ORAL_TABLET | Freq: Every morning | ORAL | 0 refills | Status: DC

## 2020-10-12 ENCOUNTER — Telehealth (HOSPITAL_COMMUNITY): Payer: Medicaid Other | Admitting: Physician Assistant

## 2020-11-05 ENCOUNTER — Telehealth (HOSPITAL_COMMUNITY): Payer: Self-pay | Admitting: *Deleted

## 2020-11-05 ENCOUNTER — Other Ambulatory Visit (HOSPITAL_COMMUNITY): Payer: Self-pay | Admitting: Physician Assistant

## 2020-11-05 DIAGNOSIS — F902 Attention-deficit hyperactivity disorder, combined type: Secondary | ICD-10-CM

## 2020-11-05 MED ORDER — AMPHETAMINE-DEXTROAMPHETAMINE 30 MG PO TABS: 30.0000 mg | ORAL_TABLET | Freq: Every morning | ORAL | 0 refills | Status: DC

## 2020-11-05 MED ORDER — AMPHETAMINE-DEXTROAMPHETAMINE 20 MG PO TABS: 20.0000 mg | ORAL_TABLET | Freq: Every evening | ORAL | 0 refills | Status: DC

## 2020-11-05 NOTE — Telephone Encounter (Signed)
PATIENT CALLED REQUESTED REFILL ON  amphetamine-dextroamphetamine (ADDERALL) 20 & 30 MG tablet

## 2020-11-05 NOTE — Telephone Encounter (Signed)
Provider was contacted by Direce E. McIntyre, RMA regarding refill request. Patient's medication to be e-prescribed to pharmacy of choice. 

## 2020-11-05 NOTE — Progress Notes (Signed)
Provider was contacted by Doris Roberson, RMA regarding refill request. Patient's medication to be e-prescribed to pharmacy of choice.

## 2020-11-06 ENCOUNTER — Telehealth (HOSPITAL_COMMUNITY): Payer: Self-pay | Admitting: *Deleted

## 2020-11-06 ENCOUNTER — Other Ambulatory Visit (HOSPITAL_COMMUNITY): Payer: Self-pay | Admitting: Physician Assistant

## 2020-11-06 DIAGNOSIS — F902 Attention-deficit hyperactivity disorder, combined type: Secondary | ICD-10-CM

## 2020-11-06 MED ORDER — AMPHETAMINE-DEXTROAMPHETAMINE 30 MG PO TABS: 30.0000 mg | ORAL_TABLET | Freq: Every morning | ORAL | 0 refills | Status: DC

## 2020-11-06 MED ORDER — AMPHETAMINE-DEXTROAMPHETAMINE 20 MG PO TABS: 20.0000 mg | ORAL_TABLET | Freq: Every evening | ORAL | 0 refills | Status: DC

## 2020-11-06 NOTE — Progress Notes (Signed)
Provider was contacted by Suzanne K Beck, RN regarding patient's medication. Prescription unable to be filled at previous preferred pharmacy due to being out of stock. Patient was able to find a pharmacy with Adderall in stock ([facility]) Patient's medications will be reissued to stand in pharmacy.

## 2020-11-06 NOTE — Telephone Encounter (Signed)
Provider was contacted by Wynona Luna, RN regarding patient's medication. Prescription unable to be filled at previous preferred pharmacy due to being out of stock. Patient was able to find a pharmacy with Adderall in stock ([facility]) Patient's medications will be reissued to stand in pharmacy.

## 2020-11-06 NOTE — Telephone Encounter (Signed)
Message left for writer she found a pharmacy that has Adderall available. She states she couldn't get any Adderall at [facility], they didn't have any in stock. Will ask Eddie PA to call rx in to  [facility] on [address] this time only at [phone] and cancel the rx at [facility].

## 2020-11-21 ENCOUNTER — Other Ambulatory Visit (HOSPITAL_COMMUNITY): Payer: Self-pay | Admitting: Physician Assistant

## 2020-11-21 DIAGNOSIS — F902 Attention-deficit hyperactivity disorder, combined type: Secondary | ICD-10-CM

## 2020-11-28 NOTE — Telephone Encounter (Signed)
Patient's medication was last filled on 11/05/2020. Patient won't need another refill until September 7th.

## 2020-11-29 ENCOUNTER — Telehealth (HOSPITAL_COMMUNITY): Payer: Self-pay | Admitting: *Deleted

## 2020-11-29 NOTE — Telephone Encounter (Signed)
Pharmacy request for patients Adderall chart indicates her last rx was written on 11/06/20. Will forward concern to [facility].

## 2020-12-04 ENCOUNTER — Other Ambulatory Visit (HOSPITAL_COMMUNITY): Payer: Self-pay | Admitting: Physician Assistant

## 2020-12-04 DIAGNOSIS — F902 Attention-deficit hyperactivity disorder, combined type: Secondary | ICD-10-CM

## 2020-12-04 MED ORDER — AMPHETAMINE-DEXTROAMPHETAMINE 30 MG PO TABS: 30.0000 mg | ORAL_TABLET | Freq: Every morning | ORAL | 0 refills | Status: DC

## 2020-12-04 MED ORDER — AMPHETAMINE-DEXTROAMPHETAMINE 20 MG PO TABS: 20.0000 mg | ORAL_TABLET | Freq: Every evening | ORAL | 0 refills | Status: DC

## 2020-12-04 NOTE — Telephone Encounter (Signed)
Provider was contacted by Suzanne K. Beck, RN regarding medication refill for this patient's adderall. Patient's medication was refilled 30 days from the last date her Adderall was dispensed. 

## 2020-12-04 NOTE — Progress Notes (Signed)
Provider was contacted by [name]. Doris Calkins, RN regarding medication refill for this patient's adderall. Patient's medication was refilled 30 days from the last date her Adderall was dispensed.

## 2021-01-04 ENCOUNTER — Other Ambulatory Visit: Payer: Self-pay

## 2021-01-04 ENCOUNTER — Encounter (HOSPITAL_COMMUNITY): Payer: Self-pay | Admitting: Physician Assistant

## 2021-01-04 ENCOUNTER — Telehealth (INDEPENDENT_AMBULATORY_CARE_PROVIDER_SITE_OTHER): Payer: Medicaid Other | Admitting: Physician Assistant

## 2021-01-04 ENCOUNTER — Telehealth (HOSPITAL_COMMUNITY): Payer: Self-pay | Admitting: *Deleted

## 2021-01-04 DIAGNOSIS — F339 Major depressive disorder, recurrent, unspecified: Secondary | ICD-10-CM

## 2021-01-04 DIAGNOSIS — F902 Attention-deficit hyperactivity disorder, combined type: Secondary | ICD-10-CM | POA: Diagnosis not present

## 2021-01-04 MED ORDER — SERTRALINE HCL 100 MG PO TABS: 100.0000 mg | ORAL_TABLET | Freq: Every day | ORAL | 2 refills | Status: DC

## 2021-01-04 MED ORDER — QUETIAPINE FUMARATE 100 MG PO TABS: 100.0000 mg | ORAL_TABLET | Freq: Every day | ORAL | 2 refills | Status: DC

## 2021-01-04 MED ORDER — AMPHETAMINE-DEXTROAMPHETAMINE 20 MG PO TABS: 20.0000 mg | ORAL_TABLET | Freq: Every evening | ORAL | 0 refills | Status: DC

## 2021-01-04 MED ORDER — AMPHETAMINE-DEXTROAMPHETAMINE 30 MG PO TABS: 30.0000 mg | ORAL_TABLET | Freq: Every morning | ORAL | 0 refills | Status: DC

## 2021-01-04 MED ORDER — HYDROXYZINE HCL 25 MG PO TABS: 25.0000 mg | ORAL_TABLET | Freq: Three times a day (TID) | ORAL | 2 refills | Status: DC | PRN

## 2021-01-04 NOTE — Telephone Encounter (Signed)
Provider was contacted by [name]. Reola Calkins, RN regarding patient's medications. Patient's medication was sent to preferred pharmacy ([facility]).

## 2021-01-04 NOTE — Telephone Encounter (Signed)
Called to ask if her provider could go ahead and call her medicines in as she was heading now over to her pharmacy. Explained he has appts till 5 p but will ask him and if he has time he will, otherwise it may be after 5 p before he can call them in. One of the meds is Adderall which I cant call in on his behalf. Will notify him.

## 2021-01-04 NOTE — Progress Notes (Addendum)
BH MD/PA/NP OP Progress Note  Virtual Visit via Video Note  I connected with Doris Roberson on 01/04/21 at  2:00 PM EDT by a video enabled telemedicine application and verified that I am speaking with the correct person using two identifiers.  Location: Patient: Secure area at work Provider: Clinic   I discussed the limitations of evaluation and management by telemedicine and the availability of in person appointments. The patient expressed understanding and agreed to proceed.  Follow Up Instructions:  I discussed the assessment and treatment plan with the patient. The patient was provided an opportunity to ask questions and all were answered. The patient agreed with the plan and demonstrated an understanding of the instructions.   The patient was advised to call back or seek an in-person evaluation if the symptoms worsen or if the condition fails to improve as anticipated.  I provided 12 minutes of non-face-to-face time during this encounter.  Meta Hatchet, PA    01/04/2021 8:39 PM Doris Roberson  MRN:  [id number]  Chief Complaint: Follow up and medication management  HPI:   Doris Roberson is a 30 year old female with a past psychiatric history significant for major depressive disorder with anxious distress and attention deficit hyperactivity disorder who presents to [facility] via virtual video visit for follow-up and medication management.  Patient is currently being managed on the following medications:  Seroquel 100 mg at bedtime Hydroxyzine 25 mg 3 times daily as needed Sertraline 100 mg daily Adderall 30 mg in the morning/Adderall 20 mg in the evening  Patient reports no issues or concerns regarding her current medication regimen.  Patient denies experiencing any adverse side effects from her medications and is requesting refills on all of her medications following the conclusion of the encounter.  Patient denies  experiencing any depressive episodes at this time.  Patient states that she does experience anxiety, however, it is manageable.  Patient rates her anxiety a 5 out of 10.  Patient's main stressor currently is that she is court case involving custody of her child and whether her parents would be permitted custody over them.  Patient states that she is currently living with her parents at this time.  Patient reports no other issues or concerns.  A GAD-7 screen was performed with the patient scoring a 9.  Patient is alert and oriented x4, calm, cooperative, fully engaged in conversation during the encounter.  Patient reports that her mood is fine.  Patient denies suicidal or homicidal ideations.  She further denies auditory or visual hallucinations and does not appear to be responding to internal/external stimuli.  Patient endorses good sleep and receives on average 7 hours of sleep each night.  Patient endorses good appetite and eats on average 3 meals per day.  Patient denies alcohol consumption and illicit drug use.  Patient endorses tobacco use and smokes on average 1/2 pack/day.   Visit Diagnosis:    ICD-10-CM   1. Major depressive disorder, recurrent episode with anxious distress (HCC)  F33.9 QUEtiapine (SEROQUEL) 100 MG tablet    hydrOXYzine (ATARAX/VISTARIL) 25 MG tablet    sertraline (ZOLOFT) 100 MG tablet    2. Attention deficit hyperactivity disorder (ADHD), combined type  F90.2 amphetamine-dextroamphetamine (ADDERALL) 20 MG tablet    amphetamine-dextroamphetamine (ADDERALL) 30 MG tablet      Past Psychiatric History:  Bipolar disorder vs Major depressive disorder Polysubstance dependence  Past Medical History:  Past Medical History:  Diagnosis Date   Alcohol abuse  s/p inpatient rehab at [facility] in 2011, two DUI's   Depression    h/o Tylenol pm overdose, subsequent admision to behavioral health   Mood disorder [facility])     Past Surgical History:  Procedure Laterality Date    ANTERIOR CRUCIATE LIGAMENT REPAIR Left 2008   CESAREAN SECTION N/A 05/07/2016   Procedure: CESAREAN SECTION;  Surgeon: Christeen Douglas, MD;  Location: [facility] ORS;  Service: Obstetrics;  Laterality: N/A;  Female born @ 35 Apgars: 9/9 Weight:7lb 11oz    Family Psychiatric History:  Denied  Family History:  Family History  Problem Relation Age of Onset   Alcohol abuse Other    Heart disease Father     Social History:  Social History   Socioeconomic History   Marital status: Single    Spouse name: Not on file   Number of children: Not on file   Years of education: Not on file   Highest education level: Not on file  Occupational History   Not on file  Tobacco Use   Smoking status: Every Day    Packs/day: 0.50    Types: Cigarettes   Smokeless tobacco: Never  Substance and Sexual Activity   Alcohol use: No   Drug use: No   Sexual activity: Yes  Other Topics Concern   Not on file  Social History Narrative   Not on file   Social Determinants of Health   Financial Resource Strain: Not on file  Food Insecurity: Not on file  Transportation Needs: Not on file  Physical Activity: Not on file  Stress: Not on file  Social Connections: Not on file    Allergies:  Allergies  Allergen Reactions   Other Other (See Comments)    An abx, doesn't remember name. Stomach upset    Nitrofurantoin Rash    Metabolic Disorder Labs: No results found for: HGBA1C, MPG No results found for: PROLACTIN No results found for: CHOL, TRIG, HDL, CHOLHDL, VLDL, LDLCALC Lab Results  Component Value Date   TSH 1.93 03/12/2010    Therapeutic Level Labs: No results found for: LITHIUM No results found for: VALPROATE No components found for:  CBMZ  Current Medications: Current Outpatient Medications  Medication Sig Dispense Refill   sertraline (ZOLOFT) 100 MG tablet Take 1 tablet (100 mg total) by mouth daily. 30 tablet 2   amphetamine-dextroamphetamine (ADDERALL) 20 MG tablet Take 1  tablet (20 mg total) by mouth every evening. 30 tablet 0   amphetamine-dextroamphetamine (ADDERALL) 30 MG tablet Take 1 tablet by mouth in the morning. 30 tablet 0   hydrOXYzine (ATARAX/VISTARIL) 25 MG tablet Take 1 tablet (25 mg total) by mouth 3 (three) times daily as needed. 75 tablet 2   ketorolac (TORADOL) 10 MG tablet Take 1 tablet (10 mg total) by mouth every 8 (eight) hours. 15 tablet 0   QUEtiapine (SEROQUEL) 100 MG tablet Take 1 tablet (100 mg total) by mouth at bedtime. 30 tablet 2   No current facility-administered medications for this visit.     Musculoskeletal: Strength & Muscle Tone: Unable to assess due to telemedicine visit Gait & Station: Unable to assess due to telemedicine visit Patient leans: Unable to assess due to telemedicine visit  Psychiatric Specialty Exam: Review of Systems  Psychiatric/Behavioral:  Negative for decreased concentration, dysphoric mood, hallucinations, self-injury, sleep disturbance and suicidal ideas. The patient is nervous/anxious. The patient is not hyperactive.    unknown if currently breastfeeding.There is no height or weight on file to calculate BMI.  General Appearance:  Well Groomed  Eye Contact:  Good  Speech:  Clear and Coherent and Normal Rate  Volume:  Normal  Mood:  Anxious and Euthymic  Affect:  Appropriate and Congruent  Thought Process:  Coherent, Goal Directed, and Descriptions of Associations: Intact  Orientation:  Full (Time, Place, and Person)  Thought Content: WDL   Suicidal Thoughts:  No  Homicidal Thoughts:  No  Memory:  Immediate;   Good Recent;   Good Remote;   Good  Judgement:  Fair  Insight:  Fair  Psychomotor Activity:  Normal  Concentration:  Concentration: Good and Attention Span: Good  Recall:  Good  Fund of Knowledge: Good  Language: Good  Akathisia:  Negative  Handed:  Right  AIMS (if indicated): not done  Assets:  Communication Skills Desire for Improvement Financial Resources/Insurance Housing   ADL's:  Intact  Cognition: WNL  Sleep:  Good   Screenings: GAD-7    Flowsheet Row Video Visit from 01/04/2021 in [facility] Video Visit from 10/03/2020 in [facility]  Total GAD-7 Score 9 7      PHQ2-9    Flowsheet Row Video Visit from 01/04/2021 in [facility] Video Visit from 10/03/2020 in [facility]  PHQ-2 Total Score 0 2  PHQ-9 Total Score -- 8      Flowsheet Row Video Visit from 01/04/2021 in [facility] Video Visit from 10/03/2020 in [facility] ED from 05/13/2018 in [facility]  C-SSRS RISK CATEGORY No Risk No Risk No Risk        Assessment and Plan:   Doris Roberson is a 30 year old female with a past psychiatric history significant for major depressive disorder with anxious distress and attention deficit hyperactivity disorder who presents to [facility] via virtual video visit for follow-up and medication management patient reports no issues or concerns regarding her current medication regimen.  Patient denies the need for dosage adjustment at this time and is requesting refills following the conclusion of the encounter.  Patient's medications to be e-prescribed to pharmacy of choice.  1. Attention deficit hyperactivity disorder (ADHD), combined type  - amphetamine-dextroamphetamine (ADDERALL) 20 MG tablet; Take 1 tablet (20 mg total) by mouth every evening.  Dispense: 30 tablet; Refill: 0 - amphetamine-dextroamphetamine (ADDERALL) 30 MG tablet; Take 1 tablet by mouth in the morning.  Dispense: 30 tablet; Refill: 0  2. Major depressive disorder, recurrent episode with anxious distress (HCC)  - QUEtiapine (SEROQUEL) 100 MG tablet; Take 1 tablet (100 mg total) by mouth at bedtime.  Dispense: 30 tablet; Refill: 2 - hydrOXYzine  (ATARAX/VISTARIL) 25 MG tablet; Take 1 tablet (25 mg total) by mouth 3 (three) times daily as needed.  Dispense: 75 tablet; Refill: 2 - sertraline (ZOLOFT) 100 MG tablet; Take 1 tablet (100 mg total) by mouth daily.  Dispense: 30 tablet; Refill: 2  Patient to follow up in 3 months Provider pent a total of 20 minutes with the patient/reviewing patient's chart  Meta Hatchet, PA 01/04/2021, 8:39 PM

## 2021-01-30 ENCOUNTER — Telehealth (HOSPITAL_COMMUNITY): Payer: Self-pay | Admitting: *Deleted

## 2021-01-30 NOTE — Telephone Encounter (Signed)
Call from patient stating she misplaced her adderall. She has "tried to tough it out but is struggling at work." She is requesting her Adderall early, approx 5 days early. Will forward her request for early controlled drug to her provider for his response.

## 2021-02-04 ENCOUNTER — Other Ambulatory Visit (HOSPITAL_COMMUNITY): Payer: Self-pay | Admitting: Physician Assistant

## 2021-02-04 DIAGNOSIS — F902 Attention-deficit hyperactivity disorder, combined type: Secondary | ICD-10-CM

## 2021-02-04 MED ORDER — AMPHETAMINE-DEXTROAMPHETAMINE 20 MG PO TABS: 20.0000 mg | ORAL_TABLET | Freq: Every evening | ORAL | 0 refills | Status: DC

## 2021-02-04 MED ORDER — AMPHETAMINE-DEXTROAMPHETAMINE 30 MG PO TABS: 30.0000 mg | ORAL_TABLET | Freq: Every morning | ORAL | 0 refills | Status: DC

## 2021-02-04 NOTE — Telephone Encounter (Signed)
Provider was contacted by Wynona Luna, RN regarding patient's medication refill request. Due to patient's medication being a controlled substance, provider will not refill prescription for days earlier. Provider refilled medication 30 days out from the previous date. A second refill order was placed for 30 days out from today's date.

## 2021-02-04 NOTE — Progress Notes (Signed)
Provider was contacted by Doris Luna, RN regarding patient's medication refill request. Patient's medication was not refilled 5 days earlier due to medication being a controlled substance. Patient's medication to be e-prescribed today; 30 days from last refill date. Provider to also put a refill in for 30 days out from today's refill date.

## 2021-02-06 ENCOUNTER — Telehealth (HOSPITAL_COMMUNITY): Payer: Self-pay | Admitting: *Deleted

## 2021-02-06 ENCOUNTER — Other Ambulatory Visit (HOSPITAL_COMMUNITY): Payer: Self-pay | Admitting: Physician Assistant

## 2021-02-06 DIAGNOSIS — F902 Attention-deficit hyperactivity disorder, combined type: Secondary | ICD-10-CM

## 2021-02-06 MED ORDER — AMPHETAMINE-DEXTROAMPHETAMINE 30 MG PO TABS: 30.0000 mg | ORAL_TABLET | Freq: Every morning | ORAL | 0 refills | Status: DC

## 2021-02-06 NOTE — Telephone Encounter (Signed)
Call from patient, she was able to fill the Adderall 20 mg that was called in yesterday to [facility] pharmacy but not the 30 mg, no rx called in for it. Will bring this to [facility] PAs attention for correction.

## 2021-02-06 NOTE — Progress Notes (Signed)
Patient also takes a prescription for Adderall 30 mg along with her Adderall 20 mg daily.

## 2021-02-07 NOTE — Telephone Encounter (Signed)
Provider was contacted by [name]. Reola Calkins, RN regarding clerical error with patient's medication. Issues has been attended to and resolved.

## 2021-02-11 ENCOUNTER — Ambulatory Visit (INDEPENDENT_AMBULATORY_CARE_PROVIDER_SITE_OTHER): Payer: Medicaid Other | Admitting: Dermatology

## 2021-02-11 ENCOUNTER — Other Ambulatory Visit: Payer: Self-pay

## 2021-02-11 DIAGNOSIS — L700 Acne vulgaris: Secondary | ICD-10-CM

## 2021-02-11 DIAGNOSIS — B360 Pityriasis versicolor: Secondary | ICD-10-CM | POA: Diagnosis not present

## 2021-02-11 DIAGNOSIS — L739 Follicular disorder, unspecified: Secondary | ICD-10-CM

## 2021-02-11 MED ORDER — ELIDEL 1 % EX CREA: TOPICAL_CREAM | CUTANEOUS | 1 refills | Status: DC

## 2021-02-11 MED ORDER — DOXYCYCLINE MONOHYDRATE 100 MG PO CAPS: 100.0000 mg | ORAL_CAPSULE | Freq: Two times a day (BID) | ORAL | 2 refills | Status: DC

## 2021-02-11 MED ORDER — KETOCONAZOLE 2 % EX CREA: TOPICAL_CREAM | CUTANEOUS | 0 refills | Status: DC

## 2021-02-11 MED ORDER — CLINDAMYCIN PHOSPHATE 1 % EX LOTN: TOPICAL_LOTION | CUTANEOUS | 3 refills | Status: DC

## 2021-02-11 MED ORDER — DIFFERIN 0.3 % EX GEL: CUTANEOUS | 3 refills | Status: DC

## 2021-02-11 NOTE — Progress Notes (Signed)
New Patient Visit  Subjective  Doris Roberson is a 30 y.o. female who presents for the following: Acne (Face, worse with menses. Patient tried doxycycline for 1-2 weeks, but didn't see improvement so she d/c. ), White spots (Left forearm, came up the end of summer. Not itchy. Tried anti-fungal cream for a few days, no improvement. Not flaky. ), and Bumps (Buttocks x years, off and on. She has tried [facility] cream off and on, helps a little. Picks at. ).   The following portions of the chart were reviewed this encounter and updated as appropriate:       Review of Systems:  No other skin or systemic complaints except as noted in HPI or Assessment and Plan.  Objective  Well appearing patient in no apparent distress; mood and affect are within normal limits.  A focused examination was performed including face, buttocks, arms. Relevant physical exam findings are noted in the Assessment and Plan.  face Scattered closed comedones on the cheeks and chin; hyperpigmented macules; couple of pink papules on cheeks.  Left Forearm - Anterior Small hypopigmented macules, no scale. R arm clear, no similar spots on trunk  Pt denies having rash in area prior to onset of white spots  buttocks Follicular pink brown papules with associated prurigo nodules.   Assessment & Plan  Acne vulgaris face  Start Differin 0.3% Gel Apply a pea-sized amount to face qhs as tolerated. Apply moisturizer first. Dsp 45g 3Rf.   Start Clindamycin lotion Apply to face qam dsp 45g 3Rf.   Consider adding spironolactone on f/up if not improving Pt started on doxycycline today for folliculitis buttocks  Topical retinoid medications like tretinoin/Retin-A, adapalene/Differin, tazarotene/Fabior, and Epiduo/Epiduo Forte can cause dryness and irritation when first started. Only apply a pea-sized amount to the entire affected area. Avoid applying it around the eyes, edges of mouth and creases at the nose. If you  experience irritation, use a good moisturizer first and/or apply the medicine less often. If you are doing well with the medicine, you can increase how often you use it until you are applying every night. Be careful with sun protection while using this medication as it can make you sensitive to the sun. This medicine should not be used by pregnant women.   Benzoyl peroxide can cause dryness and irritation of the skin. It can also bleach fabric. When used together with Aczone (dapsone) cream, it can stain the skin orange.   DIFFERIN 0.3 % gel - face Apply a pea-sized amount to face every night as tolerated for acne.  Tinea versicolor Left Forearm - Anterior  Partially treated with otc anti-fungal cream vrs Idiopathic guttate hypomelanosis  Start ketoconazole cream Apply to left arm BID x 2 weeks dsp 0Rf.  In 2 weeks, start Elidel Cream Apply to left arm BID until improved.  Tinea versicolor is a chronic recurrent skin rash causing discolored scaly spots most commonly seen on back, chest, and/or shoulders.  It is generally asymptomatic. The rash is due to overgrowth of a common type of yeast present on everyone's skin and it is not contagious.  It tends to flare more in the summer due to increased sweating on trunk.  After rash is treated, the scaliness will resolve, but the discoloration will take longer to return to normal pigmentation. The periodic use of an OTC medicated soap/shampoo with zinc or selenium sulfide can be helpful to prevent yeast overgrowth and recurrence.    ketoconazole (NIZORAL) 2 % cream - Left  Forearm - Anterior Apply to left arm twice a day x 2 weeks.  ELIDEL 1 % cream - Left Forearm - Anterior Apply to rash on arm twice a day until improved.  Folliculitis buttocks  Chronic with prurigo nodularis  Start doxycycline monohydrate 100mg  take 1 po BID x 2 weeks, then decrease to QD dsp #60 1Rf.  Start PanOxyl 4% Creamy Wash/CeraVe BP Wash/Cetaphil BP Wash - apply in  shower and let sit several minutes before rinsing.   Start Clindamycin lotion Apply to buttocks once a day after shower.   Elidel cream bid prn itch Avoid scratching, picking  Benzoyl peroxide can cause dryness and irritation of the skin. It can also bleach fabric. When used together with Aczone (dapsone) cream, it can stain the skin orange.   clindamycin (CLEOCIN-T) 1 % lotion - buttocks Apply to face every morning for acne.  doxycycline (MONODOX) 100 MG capsule - buttocks Take 1 capsule (100 mg total) by mouth 2 (two) times daily. Take with food.  Return in about 10 weeks (around 04/22/2021) for acne, folliculitis.  I1/25/2023, CMA, am acting as scribe for Cherlyn Labella, MD .  Documentation: I have reviewed the above documentation for accuracy and completeness, and I agree with the above.  Willeen Niece MD

## 2021-02-11 NOTE — Patient Instructions (Addendum)
Differin - apply a pea-sized amount to face starting every other night, increase to nightly as tolerated. Apply a moisturizer first.   Clindamcyin lotion - apply to face every morning for acne. Apply to buttocks once a day after shower.   Doxycycline 100mg  - take 1 capsule by mouth twice a day x 2 weeks, then decrease to once daily.   Ketoconazole cream - Apply to left arm x 2 weeks, then switch to Elidel Cream twice a day until spots improved.   PanOxyl 4% Creamy Wash/Cetaphil BP wash/CeraVe BP wash - apply to buttocks in shower, let sit several minutes before rinsing.   Topical retinoid medications like tretinoin/Retin-A, adapalene/Differin, tazarotene/Fabior, and Epiduo/Epiduo Forte can cause dryness and irritation when first started. Only apply a pea-sized amount to the entire affected area. Avoid applying it around the eyes, edges of mouth and creases at the nose. If you experience irritation, use a good moisturizer first and/or apply the medicine less often. If you are doing well with the medicine, you can increase how often you use it until you are applying every night. Be careful with sun protection while using this medication as it can make you sensitive to the sun. This medicine should not be used by pregnant women.   Benzoyl peroxide can cause dryness and irritation of the skin. It can also bleach fabric. When used together with Aczone (dapsone) cream, it can stain the skin orange.  Doxycycline should be taken with food to prevent nausea. Do not lay down for 30 minutes after taking. Be cautious with sun exposure and use good sun protection while on this medication. Pregnant women should not take this medication.   If you have any questions or concerns for your doctor, please call our main line at [phone] and press option 4 to reach your doctor's medical assistant. If no one answers, please leave a voicemail as directed and we will return your call as soon as possible. Messages left  after 4 pm will be answered the following business day.   You may also send [phone] a message via MyChart. We typically respond to MyChart messages within 1-2 business days.  For prescription refills, please ask your pharmacy to contact our office. Our fax number is [phone].  If you have an urgent issue when the clinic is closed that cannot wait until the next business day, you can page your doctor at the number below.    Please note that while we do our best to be available for urgent issues outside of office hours, we are not available 24/7.   If you have an urgent issue and are unable to reach [phone], you may choose to seek medical care at your doctor's office, retail clinic, urgent care center, or emergency room.  If you have a medical emergency, please immediately call 911 or go to the emergency department.  Pager Numbers  - Dr. [place]: [phone]  - Dr. [phone]: [phone]  - Dr. [phone]: [phone]  In the event of inclement weather, please call our main line at [phone] for an update on the status of any delays or closures.  Dermatology Medication Tips: Please keep the boxes that topical medications come in in order to help keep track of the instructions about where and how to use these. Pharmacies typically print the medication instructions only on the boxes and not directly on the medication tubes.   If your medication is too expensive, please contact our office at [phone] option 4 or send [phone] a message  through MyChart.   We are unable to tell what your co-pay for medications will be in advance as this is different depending on your insurance coverage. However, we may be able to find a substitute medication at lower cost or fill out paperwork to get insurance to cover a needed medication.   If a prior authorization is required to get your medication covered by your insurance company, please allow [place] 1-2 business days to complete this process.  Drug prices often  vary depending on where the prescription is filled and some pharmacies may offer cheaper prices.  The website www.goodrx.com contains coupons for medications through different pharmacies. The prices here do not account for what the cost may be with help from insurance (it may be cheaper with your insurance), but the website can give you the price if you did not use any insurance.  - You can print the associated coupon and take it with your prescription to the pharmacy.  - You may also stop by our office during regular business hours and pick up a GoodRx coupon card.  - If you need your prescription sent electronically to a different pharmacy, notify our office through [facility] or by phone at [phone] option 4.

## 2021-02-14 ENCOUNTER — Other Ambulatory Visit: Payer: Self-pay

## 2021-02-14 DIAGNOSIS — L700 Acne vulgaris: Secondary | ICD-10-CM

## 2021-02-14 MED ORDER — CLINDAMYCIN PHOSPHATE 1 % EX SOLN: CUTANEOUS | 3 refills | Status: DC

## 2021-02-14 NOTE — Progress Notes (Signed)
Rx switched to solution for insurance.

## 2021-03-05 ENCOUNTER — Telehealth (HOSPITAL_COMMUNITY): Payer: Self-pay

## 2021-03-05 NOTE — Telephone Encounter (Signed)
Received fax requesting a refill on patient's Adderall 30mg  and her Adderall 20mg  to be sent to [email or web address] [facility] [address] in [place]. Patient has a followup appointment scheduled for 12/30. Please review and advise. Thank you

## 2021-03-07 ENCOUNTER — Other Ambulatory Visit (HOSPITAL_COMMUNITY): Payer: Self-pay | Admitting: Physician Assistant

## 2021-03-07 DIAGNOSIS — F902 Attention-deficit hyperactivity disorder, combined type: Secondary | ICD-10-CM

## 2021-03-07 MED ORDER — AMPHETAMINE-DEXTROAMPHETAMINE 30 MG PO TABS
30.0000 mg | ORAL_TABLET | Freq: Every morning | ORAL | 0 refills | Status: DC
Start: 2021-03-07 — End: 2021-04-05

## 2021-03-07 MED ORDER — AMPHETAMINE-DEXTROAMPHETAMINE 20 MG PO TABS: 20.0000 mg | ORAL_TABLET | Freq: Every evening | ORAL | 0 refills | Status: DC

## 2021-03-07 NOTE — Progress Notes (Signed)
Patient was contacted by Alinda Money, CNA regarding patient's medication refill. Patient's medication to be e-prescribed to pharmacy of choice.

## 2021-03-07 NOTE — Telephone Encounter (Signed)
Provider was contacted by Alinda Money, CNA regarding medication refill. Patient's medication to be e-prescribed to pharmacy of choice.

## 2021-03-07 NOTE — Telephone Encounter (Signed)
NOTIFIED PATIENT °

## 2021-03-29 ENCOUNTER — Encounter (HOSPITAL_COMMUNITY): Payer: Self-pay | Admitting: Physician Assistant

## 2021-03-29 ENCOUNTER — Telehealth (INDEPENDENT_AMBULATORY_CARE_PROVIDER_SITE_OTHER): Payer: Medicaid Other | Admitting: Physician Assistant

## 2021-03-29 DIAGNOSIS — F902 Attention-deficit hyperactivity disorder, combined type: Secondary | ICD-10-CM

## 2021-03-29 DIAGNOSIS — F339 Major depressive disorder, recurrent, unspecified: Secondary | ICD-10-CM

## 2021-03-29 NOTE — Progress Notes (Signed)
BH MD/PA/NP OP Progress Note  Virtual Visit via Video Note  I connected with Doris Roberson on 03/29/21 at  1:00 PM EST by a video enabled telemedicine application and verified that I am speaking with the correct person using two identifiers.  Location: Patient: Secure Location Provider: Clinic   I discussed the limitations of evaluation and management by telemedicine and the availability of in person appointments. The patient expressed understanding and agreed to proceed.  Follow Up Instructions:  I discussed the assessment and treatment plan with the patient. The patient was provided an opportunity to ask questions and all were answered. The patient agreed with the plan and demonstrated an understanding of the instructions.   The patient was advised to call back or seek an in-person evaluation if the symptoms worsen or if the condition fails to improve as anticipated.  I provided 7 minutes of non-face-to-face time during this encounter.  Meta Hatchet, PA    03/29/2021 1:12 PM MISSI MCMACKIN  MRN:  [id number]  Chief Complaint: Follow up and medication management  HPI:   Doris Roberson is a 30 year old female with a past psychiatric history significant for major depressive disorder with anxious distress and attention deficit hyperactivity disorder who presents to [facility] via virtual video visit for follow-up and medication management.  Patient is currently being managed on the following medications:  Adderall 30 mg in the morning/Adderall 20 mg in the evening Seroquel 100 mg at bedtime Hydroxyzine 25 mg 3 times daily as needed Sertraline 100 mg daily  Patient reports no issues or concerns regarding her current medication regimen.  Patient denies the need for dosage adjustments at this time and is requesting no refills following the conclusion of the encounter.  Patient continues to take her medications as prescribed.   Patient denies depressive symptoms nor does she endorse anxiety.  Patient denies any new stressors at this time.  A GAD-7 screen was performed with the patient scoring a 6.  Patient is alert and oriented x4, calm, cooperative and fully engaged in conversation during the encounter.  Patient endorses good mood.  Patient denies suicidal or homicidal ideation.  She further denies auditory or visual hallucinations and does not appear to be responding to internal/external stimuli.  Patient endorses good sleep and receives on average 7 hours of sleep each night.  Patient endorses good appetite and eats on average 3 meals per day.  Patient denies alcohol consumption and illicit drug use.  Patient endorses tobacco use and smokes on average a pack every 3 days.  Visit Diagnosis:    ICD-10-CM   1. Attention deficit hyperactivity disorder (ADHD), combined type  F90.2     2. Major depressive disorder, recurrent episode with anxious distress (HCC)  F33.9       Past Psychiatric History:  Bipolar disorder vs Major depressive disorder Polysubstance dependence  Past Medical History:  Past Medical History:  Diagnosis Date   Alcohol abuse    s/p inpatient rehab at [facility] in 2011, two DUI's   Depression    h/o Tylenol pm overdose, subsequent admision to behavioral health   Mood disorder [facility])     Past Surgical History:  Procedure Laterality Date   ANTERIOR CRUCIATE LIGAMENT REPAIR Left 2008   CESAREAN SECTION N/A 05/07/2016   Procedure: CESAREAN SECTION;  Surgeon: Christeen Douglas, MD;  Location: [facility] ORS;  Service: Obstetrics;  Laterality: N/A;  Female born @ [birth year] Apgars: 9/9 Weight:7lb 11oz  Family Psychiatric History:  Denied  Family History:  Family History  Problem Relation Age of Onset   Alcohol abuse Other    Heart disease Father     Social History:  Social History   Socioeconomic History   Marital status: Single    Spouse name: Not on file   Number of children: Not on file    Years of education: Not on file   Highest education level: Not on file  Occupational History   Not on file  Tobacco Use   Smoking status: Every Day    Packs/day: 0.50    Types: Cigarettes   Smokeless tobacco: Never  Substance and Sexual Activity   Alcohol use: No   Drug use: No   Sexual activity: Yes  Other Topics Concern   Not on file  Social History Narrative   Not on file   Social Determinants of Health   Financial Resource Strain: Not on file  Food Insecurity: Not on file  Transportation Needs: Not on file  Physical Activity: Not on file  Stress: Not on file  Social Connections: Not on file    Allergies:  Allergies  Allergen Reactions   Other Other (See Comments)    An abx, doesn't remember name. Stomach upset    Nitrofurantoin Rash    Metabolic Disorder Labs: No results found for: HGBA1C, MPG No results found for: PROLACTIN No results found for: CHOL, TRIG, HDL, CHOLHDL, VLDL, LDLCALC Lab Results  Component Value Date   TSH 1.93 03/12/2010    Therapeutic Level Labs: No results found for: LITHIUM No results found for: VALPROATE No components found for:  CBMZ  Current Medications: Current Outpatient Medications  Medication Sig Dispense Refill   amphetamine-dextroamphetamine (ADDERALL) 10 MG tablet Take 2 tablets (20 mg total) by mouth every evening. 60 tablet 0   amphetamine-dextroamphetamine (ADDERALL) 30 MG tablet Take 1 tablet by mouth in the morning. 30 tablet 0   clindamycin (CLEOCIN T) 1 % external solution Apply to face every morning for acne 60 mL 3   DIFFERIN 0.3 % gel Apply a Roberson-sized amount to face every night as tolerated for acne. 45 g 3   doxycycline (MONODOX) 100 MG capsule Take 1 capsule (100 mg total) by mouth 2 (two) times daily. Take with food. 60 capsule 2   ELIDEL 1 % cream Apply to rash on arm twice a day until improved. 60 g 1   hydrOXYzine (ATARAX/VISTARIL) 25 MG tablet Take 1 tablet (25 mg total) by mouth 3 (three) times  daily as needed. 75 tablet 2   ketoconazole (NIZORAL) 2 % cream Apply to left arm twice a day x 2 weeks. 60 g 0   QUEtiapine (SEROQUEL) 100 MG tablet Take 1 tablet (100 mg total) by mouth at bedtime. 30 tablet 2   sertraline (ZOLOFT) 100 MG tablet Take 1 tablet (100 mg total) by mouth daily. 30 tablet 2   No current facility-administered medications for this visit.     Musculoskeletal: Strength & Muscle Tone: Unable to assess due to telemedicine visit Gait & Station: Unable to assess due to telemedicine visit Patient leans: Unable to assess due to telemedicine visit  Psychiatric Specialty Exam: Review of Systems  Psychiatric/Behavioral:  Negative for decreased concentration, dysphoric mood, hallucinations, self-injury, sleep disturbance and suicidal ideas. The patient is not nervous/anxious and is not hyperactive.    unknown if currently breastfeeding.There is no height or weight on file to calculate BMI.  General Appearance: Well Groomed  Eye Contact:  Good  Speech:  Clear and Coherent and Normal Rate  Volume:  Normal  Mood:  Euthymic  Affect:  Appropriate  Thought Process:  Coherent, Goal Directed, and Descriptions of Associations: Intact  Orientation:  Full (Time, Place, and Person)  Thought Content: WDL   Suicidal Thoughts:  No  Homicidal Thoughts:  No  Memory:  Immediate;   Good Recent;   Good Remote;   Good  Judgement:  Fair  Insight:  Fair  Psychomotor Activity:  Normal  Concentration:  Concentration: Good and Attention Span: Good  Recall:  Good  Fund of Knowledge: Good  Language: Good  Akathisia:  No  Handed:  Right  AIMS (if indicated): not done  Assets:  Communication Skills Desire for Improvement Financial Resources/Insurance Housing  ADL's:  Intact  Cognition: WNL  Sleep:  Good   Screenings: GAD-7    Flowsheet Row Video Visit from 03/29/2021 in [facility] Video Visit from 01/04/2021 in [facility] Video Visit from 10/03/2020 in [facility]  Total GAD-7 Score 6 9 7       PHQ2-9    Flowsheet Row Video Visit from 03/29/2021 in [facility] Video Visit from 01/04/2021 in [facility] Video Visit from 10/03/2020 in [facility]  PHQ-2 Total Score 0 0 2  PHQ-9 Total Score -- -- 8      Flowsheet Row Video Visit from 03/29/2021 in [facility] Video Visit from 01/04/2021 in [facility] Video Visit from 10/03/2020 in [facility]  C-SSRS RISK CATEGORY No Risk No Risk No Risk        Assessment and Plan:   Darlean Warmoth. Przybylski is a 30 year old female with a past psychiatric history significant for major depressive disorder with anxious distress and attention deficit hyperactivity disorder who presents to [facility] via virtual video visit for follow-up and medication management.  Patient reports no issues or concerns with her current medication regimen.  Patient denies the need for dosage adjustments at this time.  Patient to continue taking medications as prescribed.  1. Attention deficit hyperactivity disorder (ADHD), combined type Patient to continue taking Adderall milligrams in the morning/20 mg in the evening for the management of her attention deficit hyperactivity disorder  2. Major depressive disorder, recurrent episode with anxious distress (HCC) Patient to continue taking Seroquel 100 mg at bedtime for the management of her major depressive disorder with anxious distress Patient to continue taking hydroxyzine 25 mg 3 times daily as needed for the management of her major depressive disorder with anxious distress Patient to continue taking sertraline 100 mg daily for the management of her major depressive disorder with anxious distress  Patient to  follow up in 3 months Provider spent a total of 7 minutes with the patient/reviewing patient's chart  [facility], PA 03/29/2021, 1:12 PM

## 2021-04-02 ENCOUNTER — Other Ambulatory Visit: Payer: Self-pay | Admitting: Family Medicine

## 2021-04-02 ENCOUNTER — Ambulatory Visit
Admission: RE | Admit: 2021-04-02 | Discharge: 2021-04-02 | Disposition: A | Discharge status: Home / Self Care | Payer: Medicaid Other | Source: Ambulatory Visit | Attending: Family Medicine | Admitting: Family Medicine

## 2021-04-02 ENCOUNTER — Other Ambulatory Visit: Payer: Self-pay

## 2021-04-02 DIAGNOSIS — R1012 Left upper quadrant pain: Secondary | ICD-10-CM | POA: Insufficient documentation

## 2021-04-05 ENCOUNTER — Telehealth (HOSPITAL_COMMUNITY): Payer: Self-pay | Admitting: *Deleted

## 2021-04-05 ENCOUNTER — Other Ambulatory Visit (HOSPITAL_COMMUNITY): Payer: Self-pay | Admitting: Physician Assistant

## 2021-04-05 DIAGNOSIS — F902 Attention-deficit hyperactivity disorder, combined type: Secondary | ICD-10-CM

## 2021-04-05 MED ORDER — AMPHETAMINE-DEXTROAMPHETAMINE 30 MG PO TABS: 30.0000 mg | ORAL_TABLET | Freq: Every morning | ORAL | 0 refills | Status: DC

## 2021-04-05 MED ORDER — AMPHETAMINE-DEXTROAMPHETAMINE 20 MG PO TABS: 20.0000 mg | ORAL_TABLET | Freq: Every evening | ORAL | 0 refills | Status: DC

## 2021-04-05 NOTE — Progress Notes (Signed)
Provider was contacted by Suzanne K. Beck, RN regarding medication refill from this patient. Patient should be on 04/06/2021 but provider to be out of the office tomorrow. Will send patient's prescription to pharmacy of choice.

## 2021-04-05 NOTE — Telephone Encounter (Signed)
Provider was contacted by Glory Buff. Olevia Bowens, RN regarding medication refill from this patient. Patient should be on 04/06/2021 but provider to be out of the office tomorrow. Will send patient's prescription to pharmacy of choice.

## 2021-04-05 NOTE — Telephone Encounter (Signed)
VM from patient asking if her Adderall Rx can be sent in for her today. Reviewed the chart and it looks like she should be out of it on the 7th of Jan. Will forward concern to [place] PA

## 2021-04-08 ENCOUNTER — Telehealth (HOSPITAL_COMMUNITY): Payer: Self-pay | Admitting: *Deleted

## 2021-04-08 NOTE — Telephone Encounter (Signed)
Patient Doris Roberson stating that she was unable to pick up medication due to start of 2/6?

## 2021-04-09 ENCOUNTER — Telehealth (HOSPITAL_COMMUNITY): Payer: Self-pay | Admitting: *Deleted

## 2021-04-09 ENCOUNTER — Other Ambulatory Visit (HOSPITAL_COMMUNITY): Payer: Self-pay | Admitting: Physician Assistant

## 2021-04-09 DIAGNOSIS — F902 Attention-deficit hyperactivity disorder, combined type: Secondary | ICD-10-CM

## 2021-04-09 MED ORDER — AMPHETAMINE-DEXTROAMPHETAMINE 20 MG PO TABS: 20.0000 mg | ORAL_TABLET | Freq: Every evening | ORAL | 0 refills | Status: DC

## 2021-04-09 MED ORDER — AMPHETAMINE-DEXTROAMPHETAMINE 10 MG PO TABS: 20.0000 mg | ORAL_TABLET | Freq: Every evening | ORAL | 0 refills | Status: DC

## 2021-04-09 NOTE — Progress Notes (Signed)
Provider was informed by Glory Buff. Olevia Bowens, RN that patient's pharmacy does not have Adderall 20 mg in stock. Provider to re-prescribe Adderall 10 mg taking twice daily as a substitute for patient's medication.

## 2021-04-09 NOTE — Telephone Encounter (Signed)
Message received by provider from Direce Netty Starring. Patient's medication resent to pharmacy of choice.

## 2021-04-09 NOTE — Progress Notes (Signed)
Patient's medication being re-sent due to clerical error made by provider. Patient's Adderall for 20 mg has been sent to pharmacy of choice.

## 2021-04-09 NOTE — Telephone Encounter (Signed)
VM from patient saying the rx from the 6th is incorrect and she is calling for [place] to correct it so she can pick it up. She said she spoke with Eddie at her last appt that she did not want to have to call back and forth and there fore is feeling frustrated she is having to do it again this month. The note for mid dec is incomplete so unsure what the intension was for the Adderall dose but her chart dose show both a 20 and 30 mg tab. Will forward concern to [place] PA to address.

## 2021-04-09 NOTE — Telephone Encounter (Signed)
Provider was contacted by Glory Buff. Olevia Bowens, RN regarding medication issue. New prescription for patient's medication resent to pharmacy.

## 2021-04-09 NOTE — Telephone Encounter (Signed)
Provider contacted by [name]. Reola Calkins, RN regarding patient's Adderall. Patient's medication being re-sent due to clerical error made by provider. Patient's Adderall for 20 mg has been sent to pharmacy of choice.

## 2021-04-14 NOTE — Patient Instructions (Signed)
6 

## 2021-04-23 ENCOUNTER — Ambulatory Visit: Payer: Medicaid Other | Admitting: Dermatology

## 2021-05-03 ENCOUNTER — Telehealth (HOSPITAL_COMMUNITY): Payer: Self-pay | Admitting: *Deleted

## 2021-05-03 ENCOUNTER — Other Ambulatory Visit (HOSPITAL_COMMUNITY): Payer: Self-pay | Admitting: Physician Assistant

## 2021-05-03 DIAGNOSIS — F902 Attention-deficit hyperactivity disorder, combined type: Secondary | ICD-10-CM

## 2021-05-03 MED ORDER — AMPHETAMINE-DEXTROAMPHETAMINE 10 MG PO TABS: 30.0000 mg | ORAL_TABLET | Freq: Every day | ORAL | 0 refills | Status: DC

## 2021-05-03 MED ORDER — AMPHETAMINE-DEXTROAMPHETAMINE 20 MG PO TABS: 20.0000 mg | ORAL_TABLET | Freq: Every evening | ORAL | 0 refills | Status: DC

## 2021-05-03 NOTE — Telephone Encounter (Signed)
Message acknowledged and reviewed.

## 2021-05-03 NOTE — Telephone Encounter (Signed)
VM from patient seeking rx for her Adderall, she stated she always seems to run out on the weekend. Checked chart, its early and called her pharmacy to confirm her last fill date. SHe last filled on on 1/10 so 6 days early Her pharmacys policy is not to fill a stimulant more than two days early and when questioned he does not have an adderall 10 mg or any strength in stock. Will forward this concern to provider and will notify patient she needs to call around to see if she can find adderall at another pharmacy and tell her it cant be filled before the 7 th.

## 2021-05-03 NOTE — Progress Notes (Signed)
Provider was contacted by [name]. Reola Calkins, RN regarding patient's request for Adderall refill. Due to Adderall shortage, patient's adderall prescriptions to be sent to [facility].

## 2021-05-03 NOTE — Telephone Encounter (Signed)
Called Doris Roberson to inform her pharmacy doesn't have a current supply of any strength of Adderall and she will need to call around and find a pharmacy that does, secondly pharmacy said she last filled on the 10th so she would be too early to pick up. She quickly corrected me and said that wasn't accurate. Called pharmacy back and in fact she last filled the 30 mg pills on 1/6 and the 10 mg on 1/10. She would be able to fill tomorrow per most pharmacies rules. Will notify provider. Also, patient called back to say she found 20 mg Adderall at [facility].

## 2021-05-03 NOTE — Telephone Encounter (Signed)
Provider was contacted by Suzanne K. Beck, RN regarding patient's request for Adderall refill. Due to Adderall shortage, patient's adderall prescriptions to be sent to [facility]. °

## 2021-05-08 ENCOUNTER — Ambulatory Visit: Payer: Medicaid Other | Admitting: Dermatology

## 2021-05-31 ENCOUNTER — Telehealth (INDEPENDENT_AMBULATORY_CARE_PROVIDER_SITE_OTHER): Payer: Medicaid Other | Admitting: Physician Assistant

## 2021-05-31 ENCOUNTER — Encounter (HOSPITAL_COMMUNITY): Payer: Self-pay | Admitting: Physician Assistant

## 2021-05-31 DIAGNOSIS — F339 Major depressive disorder, recurrent, unspecified: Secondary | ICD-10-CM | POA: Diagnosis not present

## 2021-05-31 DIAGNOSIS — F902 Attention-deficit hyperactivity disorder, combined type: Secondary | ICD-10-CM | POA: Diagnosis not present

## 2021-05-31 MED ORDER — AMPHETAMINE-DEXTROAMPHETAMINE 20 MG PO TABS: 20.0000 mg | ORAL_TABLET | Freq: Every evening | ORAL | 0 refills | Status: DC

## 2021-05-31 MED ORDER — AMPHETAMINE-DEXTROAMPHETAMINE 10 MG PO TABS: 30.0000 mg | ORAL_TABLET | Freq: Every day | ORAL | 0 refills | Status: DC

## 2021-05-31 MED ORDER — AMPHETAMINE-DEXTROAMPHETAMINE 20 MG PO TABS: 30.0000 mg | ORAL_TABLET | Freq: Every day | ORAL | 0 refills | Status: DC

## 2021-05-31 NOTE — Progress Notes (Addendum)
BH MD/PA/NP OP Progress Note  Virtual Visit via Video Note  I connected with Doris Roberson on 05/31/21 at 1:00 PM EST by a video enabled telemedicine application and verified that I am speaking with the correct person using two identifiers.  Location: Patient: Secure Location Provider: Clinic   I discussed the limitations of evaluation and management by telemedicine and the availability of in person appointments. The patient expressed understanding and agreed to proceed.  Follow Up Instructions:   I discussed the assessment and treatment plan with the patient. The patient was provided an opportunity to ask questions and all were answered. The patient agreed with the plan and demonstrated an understanding of the instructions.   The patient was advised to call back or seek an in-person evaluation if the symptoms worsen or if the condition fails to improve as anticipated.  I provided 7 minutes of non-face-to-face time during this encounter.  Meta Hatchet, PA    05/31/2021 1:08 PM Doris Roberson  MRN:  [id number]  Chief Complaint:  Chief Complaint  Patient presents with   Follow-up   Medication Management   HPI:   Doris Roberson is a 31 year old female with a past psychiatric history significant for major depressive disorder with anxious distress and attention deficit hyperactivity disorder who presents to [facility] via virtual video visit for follow-up of medication management.  Patient is currently being managed on the following medications:  Adderall 30 mg in the morning/Adderall 20 mg in the evening Seroquel 100 mg at bedtime Hydroxyzine 25 mg 3 times daily as needed Sertraline 100 mg daily  Patient reports no issues or concerns regarding her current medication regimen.  Patient denies the need for dosage adjustments at this time and is only requesting refills on her Adderall.  Patient denies experiencing any  depressive symptoms but states that she still continues to endorse some anxiety.  Patient states that her anxiety is manageable at this time and rates her anxiety at 3 out of 10.  Patient's main stressor includes her son spending more time with his father.  A GAD-7 screen was performed with the patient scoring a 6.  Patient is alert and oriented x4, calm, cooperative, and fully engaged in conversation during the encounter.  Patient endorses all right mood.  Patient denies suicidal or homicidal ideations.  She further denies auditory or visual hallucinations and does not appear to be responding to internal/external stimuli.  Patient endorses good sleep and receives on average 8 hours of sleep each night.  Patient endorses good appetite and eats on average 3 meals per day.  Patient denies alcohol consumption and illicit drug use.  Patient endorses tobacco use and smokes on average 4 to 5 cigarettes/day.  Visit Diagnosis:    ICD-10-CM   1. Attention deficit hyperactivity disorder (ADHD), combined type  F90.2 amphetamine-dextroamphetamine (ADDERALL) 10 MG tablet    amphetamine-dextroamphetamine (ADDERALL) 20 MG tablet      Past Psychiatric History:  Bipolar disorder vs Major depressive disorder Polysubstance dependence  Past Medical History:  Past Medical History:  Diagnosis Date   Alcohol abuse    s/p inpatient rehab at [facility] in 2011, two DUI's   Depression    h/o Tylenol pm overdose, subsequent admision to behavioral health   Mood disorder [facility])     Past Surgical History:  Procedure Laterality Date   ANTERIOR CRUCIATE LIGAMENT REPAIR Left 2008   CESAREAN SECTION N/A 05/07/2016   Procedure: CESAREAN SECTION;  Surgeon:  Christeen Douglas, MD;  Location: [facility] ORS;  Service: Obstetrics;  Laterality: N/A;  Female born @ [birth year] Apgars: 9/9 Weight:7lb 11oz    Family Psychiatric History:  Denied  Family History:  Family History  Problem Relation Age of Onset   Alcohol abuse Other     Heart disease Father     Social History:  Social History   Socioeconomic History   Marital status: Single    Spouse name: Not on file   Number of children: Not on file   Years of education: Not on file   Highest education level: Not on file  Occupational History   Not on file  Tobacco Use   Smoking status: Every Day    Packs/day: 0.50    Types: Cigarettes   Smokeless tobacco: Never  Substance and Sexual Activity   Alcohol use: No   Drug use: No   Sexual activity: Yes  Other Topics Concern   Not on file  Social History Narrative   Not on file   Social Determinants of Health   Financial Resource Strain: Not on file  Food Insecurity: Not on file  Transportation Needs: Not on file  Physical Activity: Not on file  Stress: Not on file  Social Connections: Not on file    Allergies:  Allergies  Allergen Reactions   Other Other (See Comments)    An abx, doesn't remember name. Stomach upset    Nitrofurantoin Rash    Metabolic Disorder Labs: No results found for: HGBA1C, MPG No results found for: PROLACTIN No results found for: CHOL, TRIG, HDL, CHOLHDL, VLDL, LDLCALC Lab Results  Component Value Date   TSH 1.93 03/12/2010    Therapeutic Level Labs: No results found for: LITHIUM No results found for: VALPROATE No components found for:  CBMZ  Current Medications: Current Outpatient Medications  Medication Sig Dispense Refill   amphetamine-dextroamphetamine (ADDERALL) 10 MG tablet Take 3 tablets (30 mg total) by mouth daily. Take 30 mg total (3 tablets) in the morning. 90 tablet 0   amphetamine-dextroamphetamine (ADDERALL) 20 MG tablet Take 1 tablet (20 mg total) by mouth every evening. 30 tablet 0   clindamycin (CLEOCIN T) 1 % external solution Apply to face every morning for acne 60 mL 3   DIFFERIN 0.3 % gel Apply a Roberson-sized amount to face every night as tolerated for acne. 45 g 3   doxycycline (MONODOX) 100 MG capsule Take 1 capsule (100 mg total) by mouth 2  (two) times daily. Take with food. 60 capsule 2   ELIDEL 1 % cream Apply to rash on arm twice a day until improved. 60 g 1   hydrOXYzine (ATARAX/VISTARIL) 25 MG tablet Take 1 tablet (25 mg total) by mouth 3 (three) times daily as needed. 75 tablet 2   ketoconazole (NIZORAL) 2 % cream Apply to left arm twice a day x 2 weeks. 60 g 0   QUEtiapine (SEROQUEL) 100 MG tablet Take 1 tablet (100 mg total) by mouth at bedtime. 30 tablet 2   sertraline (ZOLOFT) 100 MG tablet Take 1 tablet (100 mg total) by mouth daily. 30 tablet 2   No current facility-administered medications for this visit.     Musculoskeletal: Strength & Muscle Tone: Unable to assess due to telemedicine visit Gait & Station: Unable to assess due to telemedicine visit Patient leans: Unable to assess due to telemedicine visit  Psychiatric Specialty Exam: Review of Systems  Psychiatric/Behavioral:  Negative for decreased concentration, dysphoric mood, hallucinations, self-injury, sleep disturbance and suicidal  ideas. The patient is nervous/anxious. The patient is not hyperactive.    unknown if currently breastfeeding.There is no height or weight on file to calculate BMI.  General Appearance: Well Groomed  Eye Contact:  Good  Speech:  Clear and Coherent and Normal Rate  Volume:  Normal  Mood:  Euthymic  Affect:  Appropriate  Thought Process:  Coherent and Descriptions of Associations: Intact  Orientation:  Full (Time, Place, and Person)  Thought Content: WDL   Suicidal Thoughts:  No  Homicidal Thoughts:  No  Memory:  Immediate;   Good Recent;   Good Remote;   Good  Judgement:  Fair  Insight:  Fair  Psychomotor Activity:  Normal  Concentration:  Concentration: Good and Attention Span: Good  Recall:  Good  Fund of Knowledge: Good  Language: Good  Akathisia:  No  Handed:  Right  AIMS (if indicated): not done  Assets:  Communication Skills Desire for Improvement Financial  Resources/Insurance Housing Vocational/Educational  ADL's:  Intact  Cognition: WNL  Sleep:  Good   Screenings: GAD-7    Flowsheet Row Video Visit from 03/29/2021 in [facility] Video Visit from 01/04/2021 in [facility] Video Visit from 10/03/2020 in [facility]  Total GAD-7 Score 6 9 7       PHQ2-9    Flowsheet Row Video Visit from 03/29/2021 in [facility] Video Visit from 01/04/2021 in [facility] Video Visit from 10/03/2020 in [facility]  PHQ-2 Total Score 0 0 2  PHQ-9 Total Score -- -- 8      Flowsheet Row Video Visit from 03/29/2021 in [facility] Video Visit from 01/04/2021 in [facility] Video Visit from 10/03/2020 in [facility]  C-SSRS RISK CATEGORY No Risk No Risk No Risk        Assessment and Plan:   Doris Roberson is a 31 year old female with a past psychiatric history significant for major depressive disorder with anxious distress and attention deficit hyperactivity disorder who presents to [facility] via virtual video visit for follow-up of medication management.  Patient reports no issues or concerns regarding her current medication regimen.  Patient denies depressive symptoms and states that her anxiety is manageable at this time.  Patient is only requesting refills on her Adderall prescriptions at this time.  Patient's medication to be e-prescribed to pharmacy of choice.  Collaboration of Care: Collaboration of Care: Medication Management AEB provider managing patient psychiatric medications and Psychiatrist AEB patient being followed by a mental health provider.  Patient/Guardian was advised Release of Information must be obtained prior to any record release in order  to collaborate their care with an outside provider. Patient/Guardian was advised if they have not already done so to contact the registration department to sign all necessary forms in order for [facility] to release information regarding their care.   Consent: Patient/Guardian gives verbal consent for treatment and assignment of benefits for services provided during this visit. Patient/Guardian expressed understanding and agreed to proceed.   1. Attention deficit hyperactivity disorder (ADHD), combined type  - amphetamine-dextroamphetamine (ADDERALL) 20 MG tablet; Take 1.5 tablets (30 mg total) by mouth daily. Take 30 mg total (3 tablets) in the morning.  Dispense: 45 tablet; Refill: 0 - amphetamine-dextroamphetamine (ADDERALL) 20 MG tablet; Take 1 tablet (20 mg total) by mouth every evening.  Dispense: 30 tablet;  Refill: 0  Patient to follow up in 3 months Provider spent a total of 7 minutes with the patient/reviewing patient's chart  Meta HatchetUchenna E Anandi Abramo, PA 05/31/2021, 1:08 PM

## 2021-06-28 ENCOUNTER — Other Ambulatory Visit (HOSPITAL_COMMUNITY): Payer: Self-pay | Admitting: Physician Assistant

## 2021-06-28 ENCOUNTER — Telehealth (HOSPITAL_COMMUNITY): Payer: Self-pay | Admitting: *Deleted

## 2021-06-28 DIAGNOSIS — F902 Attention-deficit hyperactivity disorder, combined type: Secondary | ICD-10-CM

## 2021-06-28 MED ORDER — AMPHETAMINE-DEXTROAMPHETAMINE 30 MG PO TABS: 30.0000 mg | ORAL_TABLET | Freq: Every day | ORAL | 0 refills | Status: DC

## 2021-06-28 MED ORDER — AMPHETAMINE-DEXTROAMPHETAMINE 20 MG PO TABS: 20.0000 mg | ORAL_TABLET | Freq: Every evening | ORAL | 0 refills | Status: DC

## 2021-06-28 NOTE — Progress Notes (Signed)
Provider was contacted by Glory Buff. Olevia Bowens, RN regarding patient's request for Adderall refill.  Patient's medication to be e-prescribed to pharmacy of choice. ?

## 2021-06-28 NOTE — Telephone Encounter (Signed)
Provider was contacted by Suzanne K. Beck, RN regarding patient's request for Adderall refill.  Patient's medication to be e-prescribed to pharmacy of choice. ?

## 2021-06-28 NOTE — Telephone Encounter (Signed)
Call from patient requesting her Adderall be called in to her preferred pharmacy which is [facility]. She confirmed this am they had a supply. Will forward request to [facility] her provider.  ?

## 2021-07-26 ENCOUNTER — Other Ambulatory Visit (HOSPITAL_COMMUNITY): Payer: Self-pay | Admitting: Physician Assistant

## 2021-07-26 ENCOUNTER — Telehealth (HOSPITAL_COMMUNITY): Payer: Self-pay | Admitting: *Deleted

## 2021-07-26 DIAGNOSIS — F902 Attention-deficit hyperactivity disorder, combined type: Secondary | ICD-10-CM

## 2021-07-26 MED ORDER — AMPHETAMINE-DEXTROAMPHETAMINE 20 MG PO TABS: 20.0000 mg | ORAL_TABLET | Freq: Every evening | ORAL | 0 refills | Status: DC

## 2021-07-26 MED ORDER — AMPHETAMINE-DEXTROAMPHETAMINE 30 MG PO TABS: 30.0000 mg | ORAL_TABLET | Freq: Every day | ORAL | 0 refills | Status: DC

## 2021-07-26 NOTE — Telephone Encounter (Signed)
VM from patient re needing a new rx for her adderall to be sent in to her preferred pharmacy. Her last rx was on 3/31 and she has a future appt on 08/30/21. Will notify Eddie PA for a new rx for her.  ?

## 2021-07-26 NOTE — Telephone Encounter (Signed)
Provider was contacted by Wynona Luna, RN regarding patient's request for medications to be refilled.  Patient's medications to be e-prescribed to pharmacy of choice.

## 2021-07-26 NOTE — Progress Notes (Signed)
Provider was contacted by Suzanne K Beck, RN regarding patient's request for medications to be refilled.  Patient's medications to be e-prescribed to pharmacy of choice.

## 2021-08-23 ENCOUNTER — Telehealth (HOSPITAL_COMMUNITY): Payer: Self-pay | Admitting: *Deleted

## 2021-08-23 ENCOUNTER — Other Ambulatory Visit (HOSPITAL_COMMUNITY): Payer: Self-pay | Admitting: Physician Assistant

## 2021-08-23 DIAGNOSIS — F902 Attention-deficit hyperactivity disorder, combined type: Secondary | ICD-10-CM

## 2021-08-23 MED ORDER — AMPHETAMINE-DEXTROAMPHETAMINE 10 MG PO TABS: 30.0000 mg | ORAL_TABLET | Freq: Every day | ORAL | 0 refills | Status: DC

## 2021-08-23 MED ORDER — AMPHETAMINE-DEXTROAMPHETAMINE 20 MG PO TABS: 20.0000 mg | ORAL_TABLET | Freq: Every evening | ORAL | 0 refills | Status: DC

## 2021-08-23 NOTE — Telephone Encounter (Signed)
Patient's medication sent to pharmacy of choice.

## 2021-08-23 NOTE — Telephone Encounter (Signed)
Call back from patient to report her pharmacy doesn't have 30 mg adderall but they do have 20 mg. Asking if Doris Roberson can call her in adderall using 20 mg. Will forward the request

## 2021-08-23 NOTE — Progress Notes (Signed)
Provider was contacted by Suzanne K. Beck, RN regarding patient's request for medication to be filled. Patient's medication to be e-prescribed to pharmacy of choice.

## 2021-08-23 NOTE — Telephone Encounter (Signed)
Provider was contacted by Glory Buff. Olevia Bowens, RN regarding patient's request for medication to be filled. Patient's medication to be e-prescribed to pharmacy of choice.

## 2021-08-23 NOTE — Telephone Encounter (Signed)
VM left for writer requesting her Adderall for this month. She had her last Adderall Rx written on 07/26/21. She will be out before the weekend is over and Monday is a holiday and this office will be closed. Will forward this note for the provider to send her rx in to her preferred pharmacy.

## 2021-08-30 ENCOUNTER — Telehealth (INDEPENDENT_AMBULATORY_CARE_PROVIDER_SITE_OTHER): Payer: Medicaid Other | Admitting: Physician Assistant

## 2021-08-30 DIAGNOSIS — F902 Attention-deficit hyperactivity disorder, combined type: Secondary | ICD-10-CM | POA: Diagnosis not present

## 2021-08-30 DIAGNOSIS — F339 Major depressive disorder, recurrent, unspecified: Secondary | ICD-10-CM | POA: Diagnosis not present

## 2021-08-30 NOTE — Progress Notes (Cosign Needed Addendum)
BH MD/PA/NP OP Progress Note  Virtual Visit via Video Note  I connected with Doris Roberson on 08/30/21 at 1:00 PM EST by a video enabled telemedicine application and verified that I am speaking with the correct person using two identifiers.  Location: Patient: Secure Location Provider: Clinic   I discussed the limitations of evaluation and management by telemedicine and the availability of in person appointments. The patient expressed understanding and agreed to proceed.  Follow Up Instructions:   I discussed the assessment and treatment plan with the patient. The patient was provided an opportunity to ask questions and all were answered. The patient agreed with the plan and demonstrated an understanding of the instructions.   The patient was advised to call back or seek an in-person evaluation if the symptoms worsen or if the condition fails to improve as anticipated.  I provided 5 minutes of non-face-to-face time during this encounter.  Meta HatchetUchenna E Coleen Cardiff, PA    08/30/2021 1:08 PM Doris PeaLeeanna R Hollenberg  MRN:  [id number]  Chief Complaint:  Chief Complaint  Patient presents with   Follow-up   HPI:   Doris Roberson is a 31 year old female with a past psychiatric history significant for major depressive disorder with anxious distress and attention deficit hyperactivity disorder [facility] via virtual video visit for follow-up of medication management.  Patient is currently being managed on the following medications:  Adderall 30 mg in the morning/Adderall 20 mg in the evening Seroquel 100 mg at bedtime Hydroxyzine 25 mg 3 times daily as needed Sertraline 100 mg daily  In regards to her Adderall, patient reports that she has been experiencing headaches from the use of her 10 mg Adderall tablets.  Patient recently had to switch her previous tablets with 10 mg tablets due to the stimulant shortage.  Despite experiencing  headaches, patient reports that she continues to do well.  Patient denies depressive symptoms or anxiety.  She further denies any new stressors at this time.  Patient is alert and oriented x4, calm, cooperative, and fully engaged in conversation during the encounter.  Patient endorses good mood; however, states that she feels sleepy.  Patient denies suicidal or homicidal ideations.  She further denies auditory or visual hallucinations and does not appear to be responding to internal/external stimuli.  Patient endorses good sleep and receives on average 6 to 7 hours of sleep each night.  Patient endorses good appetite and eats on average 3 meals per day.  Patient denies alcohol consumption and illicit drug use.  Patient endorses tobacco use and states that she smokes on average 3 cigarettes/day.  Visit Diagnosis:    ICD-10-CM   1. Attention deficit hyperactivity disorder (ADHD), combined type  F90.2     2. Major depressive disorder, recurrent episode with anxious distress (HCC)  F33.9       Past Psychiatric History:  Bipolar disorder vs Major depressive disorder Polysubstance dependence  Past Medical History:  Past Medical History:  Diagnosis Date   Alcohol abuse    s/p inpatient rehab at [facility] in 2011, two DUI's   Depression    h/o Tylenol pm overdose, subsequent admision to behavioral health   Mood disorder Doris Roberson(HCC)     Past Surgical History:  Procedure Laterality Date   ANTERIOR CRUCIATE LIGAMENT REPAIR Left 2008   CESAREAN SECTION N/A 05/07/2016   Procedure: CESAREAN SECTION;  Surgeon: Christeen DouglasBethany Beasley, MD;  Location: [facility] ORS;  Service: Obstetrics;  Laterality: N/A;  Female  born @ 59 Apgars: 9/9 Weight:7lb 11oz    Family Psychiatric History:  Denied  Family History:  Family History  Problem Relation Age of Onset   Alcohol abuse Other    Heart disease Father     Social History:  Social History   Socioeconomic History   Marital status: Single    Spouse name: Not  on file   Number of children: Not on file   Years of education: Not on file   Highest education level: Not on file  Occupational History   Not on file  Tobacco Use   Smoking status: Every Day    Packs/day: 0.50    Types: Cigarettes   Smokeless tobacco: Never  Substance and Sexual Activity   Alcohol use: No   Drug use: No   Sexual activity: Yes  Other Topics Concern   Not on file  Social History Narrative   Not on file   Social Determinants of Health   Financial Resource Strain: Not on file  Food Insecurity: Not on file  Transportation Needs: Not on file  Physical Activity: Not on file  Stress: Not on file  Social Connections: Not on file    Allergies:  Allergies  Allergen Reactions   Other Other (See Comments)    An abx, doesn't remember name. Stomach upset    Nitrofurantoin Rash    Metabolic Disorder Labs: No results found for: HGBA1C, MPG No results found for: PROLACTIN No results found for: CHOL, TRIG, HDL, CHOLHDL, VLDL, LDLCALC Lab Results  Component Value Date   TSH 1.93 03/12/2010    Therapeutic Level Labs: No results found for: LITHIUM No results found for: VALPROATE No components found for:  CBMZ  Current Medications: Current Outpatient Medications  Medication Sig Dispense Refill   amphetamine-dextroamphetamine (ADDERALL) 10 MG tablet Take 3 tablets (30 mg total) by mouth daily. Patient to take 3 tablets (30 mg total) every morning 90 tablet 0   amphetamine-dextroamphetamine (ADDERALL) 20 MG tablet Take 1 tablet (20 mg total) by mouth every evening. 30 tablet 0   amphetamine-dextroamphetamine (ADDERALL) 30 MG tablet Take 1 tablet by mouth daily. Take 1 tablet in the morning. 30 tablet 0   clindamycin (CLEOCIN T) 1 % external solution Apply to face every morning for acne 60 mL 3   DIFFERIN 0.3 % gel Apply a pea-sized amount to face every night as tolerated for acne. 45 g 3   doxycycline (MONODOX) 100 MG capsule Take 1 capsule (100 mg total) by  mouth 2 (two) times daily. Take with food. 60 capsule 2   ELIDEL 1 % cream Apply to rash on arm twice a day until improved. 60 g 1   hydrOXYzine (ATARAX/VISTARIL) 25 MG tablet Take 1 tablet (25 mg total) by mouth 3 (three) times daily as needed. 75 tablet 2   ketoconazole (NIZORAL) 2 % cream Apply to left arm twice a day x 2 weeks. 60 g 0   QUEtiapine (SEROQUEL) 100 MG tablet Take 1 tablet (100 mg total) by mouth at bedtime. 30 tablet 2   sertraline (ZOLOFT) 100 MG tablet Take 1 tablet (100 mg total) by mouth daily. 30 tablet 2   No current facility-administered medications for this visit.     Musculoskeletal: Strength & Muscle Tone: Unable to assess due to telemedicine visit Gait & Station: Unable to assess due to telemedicine visit Patient leans: Unable to assess due to telemedicine visit  Psychiatric Specialty Exam: Review of Systems  Psychiatric/Behavioral:  Negative for decreased concentration,  dysphoric mood, hallucinations, self-injury, sleep disturbance and suicidal ideas. The patient is nervous/anxious. The patient is not hyperactive.    unknown if currently breastfeeding.There is no height or weight on file to calculate BMI.  General Appearance: Well Groomed  Eye Contact:  Good  Speech:  Clear and Coherent and Normal Rate  Volume:  Normal  Mood:  Euthymic  Affect:  Appropriate  Thought Process:  Coherent and Descriptions of Associations: Intact  Orientation:  Full (Time, Place, and Person)  Thought Content: WDL   Suicidal Thoughts:  No  Homicidal Thoughts:  No  Memory:  Immediate;   Good Recent;   Good Remote;   Good  Judgement:  Fair  Insight:  Fair  Psychomotor Activity:  Normal  Concentration:  Concentration: Good and Attention Span: Good  Recall:  Good  Fund of Knowledge: Good  Language: Good  Akathisia:  No  Handed:  Right  AIMS (if indicated): not done  Assets:  Communication Skills Desire for Improvement Financial  Resources/Insurance Housing Vocational/Educational  ADL's:  Intact  Cognition: WNL  Sleep:  Good   Screenings: GAD-7    Flowsheet Row Video Visit from 08/30/2021 in [facility] Video Visit from 05/31/2021 in [facility] Video Visit from 03/29/2021 in [facility] Video Visit from 01/04/2021 in [facility] Video Visit from 10/03/2020 in [facility]  Total GAD-7 Score PHQ2-9    Flowsheet Row Video Visit from 08/30/2021 in [facility] Video Visit from 05/31/2021 in [facility] Video Visit from 03/29/2021 in [facility] Video Visit from 01/04/2021 in [facility] Video Visit from 10/03/2020 in [facility]  PHQ-2 Total Score 1 1 0 0 2  PHQ-9 Total Score -- -- -- -- 8      Flowsheet Row Video Visit from 08/30/2021 in [facility] Video Visit from 05/31/2021 in [facility] Video Visit from 03/29/2021 in [facility]  C-SSRS RISK CATEGORY No Risk No Risk No Risk        Assessment and Plan:   Carissa Musick. Posten is a 31 year old female with a past psychiatric history significant for major depressive disorder with anxious distress and attention deficit hyperactivity disorder who presents to [facility] via virtual video visit for follow-up of medication management.  Patient endorses experiencing some headaches when taking her Adderall 10 mg tablets but states that her use of the medication has been tolerable.  Patient denies experiencing depressive symptoms or anxiety.  Patient appears stable on her current medication regimen.  Patient medications to be e-prescribed to pharmacy of  choice.  Collaboration of Care: Collaboration of Care: Medication Management AEB provider managing patient psychiatric medications and Psychiatrist AEB patient being followed by a mental health provider.  Patient/Guardian was advised Release of Information must be obtained prior to any record release in order to collaborate their care with an outside provider. Patient/Guardian was advised if they have not already done so to contact the registration department to sign all necessary forms in order for [place] to release information regarding their care.   Consent: Patient/Guardian gives verbal consent for treatment and assignment of benefits for services provided during this visit. Patient/Guardian expressed understanding and agreed to proceed.   1. Attention deficit hyperactivity  disorder (ADHD), combined type Patient to continue taking Adderall 30 mg in the morning/20 mg in the evening for the management of her ADHD  2. Major depressive disorder, recurrent episode with anxious distress (HCC) Patient to continue taking Seroquel 100 mg at bedtime for the management of her major depressive disorder with anxious distress Patient to continue taking hydroxyzine 25 mg 3 times daily as needed for the management of her major depressive disorder with anxious distress Patient to continue taking sertraline 100 mg daily for the management of her major depressive disorder with anxious distress  Patient to follow up in 3 months Provider spent a total of 5 minutes with the patient/reviewing patient's chart  Meta Hatchet, PA 08/30/2021, 1:08 PM

## 2021-09-02 ENCOUNTER — Encounter (HOSPITAL_COMMUNITY): Payer: Self-pay | Admitting: Physician Assistant

## 2021-09-06 ENCOUNTER — Telehealth (HOSPITAL_COMMUNITY): Payer: Self-pay | Admitting: *Deleted

## 2021-09-06 ENCOUNTER — Other Ambulatory Visit (HOSPITAL_COMMUNITY): Payer: Self-pay | Admitting: Physician Assistant

## 2021-09-06 DIAGNOSIS — F902 Attention-deficit hyperactivity disorder, combined type: Secondary | ICD-10-CM

## 2021-09-06 NOTE — Telephone Encounter (Signed)
VM left for writer to inform her preferred pharmacy of [facility] also has the 30 mg tabs she needs. Will inform provider.

## 2021-09-06 NOTE — Telephone Encounter (Signed)
Call from patient, states the 10 mg give her a headache and doesn't want to take them so she threw them out and she is calling today to ask Eddie PA to send in for the 65s and 30s. She knows [facility] has 20s and she will try to find phar with 30 mg but if cant states in the past eddie pa wrote for her to take 40 mg rather than 50 mg. Will forward this concern to the provider. Discussed with her in future to never throw away the meds due to its control status and it is ususally turned back in to the pharmacy before a new rx can be picked up as this would be early.

## 2021-09-09 ENCOUNTER — Telehealth (HOSPITAL_COMMUNITY): Payer: Self-pay | Admitting: *Deleted

## 2021-09-09 NOTE — Telephone Encounter (Signed)
Patient called to follow up with previous call with Collie Siad on Friday 6/9 & to inform that  [facility] also has the 30 mg tabs she needs back in stock

## 2021-09-10 ENCOUNTER — Other Ambulatory Visit (HOSPITAL_COMMUNITY): Payer: Self-pay | Admitting: Physician Assistant

## 2021-09-10 DIAGNOSIS — F902 Attention-deficit hyperactivity disorder, combined type: Secondary | ICD-10-CM

## 2021-09-10 MED ORDER — AMPHETAMINE-DEXTROAMPHETAMINE 30 MG PO TABS: 30.0000 mg | ORAL_TABLET | Freq: Every morning | ORAL | 0 refills | Status: DC

## 2021-09-10 NOTE — Telephone Encounter (Signed)
Provider was contacted by Reino Bellis, RMA regarding patient's request for Adderall prescription to be refilled.  Provider was able to contact pharmacist to discuss that patient previously discarded her Adderall 10 mg tablets due to experiencing headaches.  Both provider and pharmacist agreed to provide a 12-day supply for patient that will last her 30 days from her last refill.  Patient's medication to be e-prescribed to pharmacy of choice.

## 2021-09-10 NOTE — Progress Notes (Signed)
Provider was contacted by Eliezer Lofts, RMA regarding patient's request for Adderall to be refilled.  Provider was previously informed that patient give away her 10 mg tablets due to experiencing headaches from her medication.  Provider contacted the pharmacist at [facility] [address] to inform them of the situation.  Provider informed pharmacist that patient disposed of medications without informing pharmacist or provider that she was doing so.  Both pharmacist and provider agreed to fill patient's prescription for the amount of pills that she would have left in a 30-day period.  Provider to contact patient to discuss never to dispose of Adderall prescription and that she must contact provider and her pharmacist before trying to dispose of medication.  A 13-day supply of patient's Adderall prescription to be e-prescribed to pharmacy of choice.

## 2021-09-23 ENCOUNTER — Telehealth (HOSPITAL_COMMUNITY): Payer: Self-pay | Admitting: *Deleted

## 2021-09-24 ENCOUNTER — Other Ambulatory Visit (HOSPITAL_COMMUNITY): Payer: Self-pay | Admitting: Psychiatry

## 2021-09-24 ENCOUNTER — Telehealth (HOSPITAL_COMMUNITY): Payer: Self-pay | Admitting: *Deleted

## 2021-09-24 DIAGNOSIS — F902 Attention-deficit hyperactivity disorder, combined type: Secondary | ICD-10-CM

## 2021-09-24 MED ORDER — AMPHETAMINE-DEXTROAMPHETAMINE 30 MG PO TABS: 30.0000 mg | ORAL_TABLET | Freq: Every morning | ORAL | 0 refills | Status: DC

## 2021-09-24 NOTE — Telephone Encounter (Signed)
Doris Roberson is Out of Office Sending Refill request to Dr Toy Cookey  Patient called & stated that she's also on the Adderall 20 mg .  Per provider note as plan of care: Marland Kitchen Attention deficit hyperactivity disorder (ADHD), combined type Patient to continue taking Adderall 30 mg in the morning/20 mg in the evening for the management of her ADHD

## 2021-09-26 ENCOUNTER — Other Ambulatory Visit (HOSPITAL_COMMUNITY): Payer: Self-pay | Admitting: Psychiatry

## 2021-09-26 ENCOUNTER — Telehealth (HOSPITAL_COMMUNITY): Payer: Self-pay | Admitting: *Deleted

## 2021-09-26 MED ORDER — AMPHETAMINE-DEXTROAMPHETAMINE 20 MG PO TABS: 20.0000 mg | ORAL_TABLET | Freq: Every day | ORAL | 0 refills | Status: DC

## 2021-09-26 NOTE — Telephone Encounter (Signed)
Medication refilled and sent to preferred pharmacy

## 2021-09-26 NOTE — Telephone Encounter (Signed)
Patient called stated her  20 mg Adderall hasn't been ordered.  Otila Back is Out of Office Sending Refill request to Dr Toy Cookey   Patient called & stated that she's also on the Adderall 20 mg .   Per provider note as plan of care: Marland Kitchen Attention deficit hyperactivity disorder (ADHD), combined type Patient to continue taking Adderall 30 mg in the morning/20 mg in the evening for the management of her ADHD

## 2021-10-17 ENCOUNTER — Telehealth (HOSPITAL_COMMUNITY): Payer: Self-pay | Admitting: *Deleted

## 2021-10-17 ENCOUNTER — Other Ambulatory Visit (HOSPITAL_COMMUNITY): Payer: Self-pay | Admitting: Psychiatry

## 2021-10-17 NOTE — Telephone Encounter (Signed)
Provider called pharmacy and was informed pharmacist that she picked up her prescription on 09/24/2021.  He goes on to Chief Executive Officer that he has an active prescription for Adderall but will not release it until 10/22/2021.  Provider endorsed understanding and agreed.  At this time Adderall not reordered.  No other concerns at this time.

## 2021-10-17 NOTE — Telephone Encounter (Signed)
Patient called stated" [place]" Stated " she spoke with [facility] & they stated they have a script 09/26/21 on file & only will fill if provider okay's amphetamine-dextroamphetamine (ADDERALL) 30 MG tabletamphetamine-dextroamphetamine (ADDERALL) 30 MG tablet Take 1 tablet by mouth every morning   Take 1 tablet by mouth every morning

## 2021-10-24 ENCOUNTER — Telehealth (HOSPITAL_COMMUNITY): Payer: Self-pay | Admitting: *Deleted

## 2021-10-24 ENCOUNTER — Other Ambulatory Visit (HOSPITAL_COMMUNITY): Payer: Self-pay | Admitting: Student in an Organized Health Care Education/Training Program

## 2021-10-24 DIAGNOSIS — F902 Attention-deficit hyperactivity disorder, combined type: Secondary | ICD-10-CM

## 2021-10-24 MED ORDER — AMPHETAMINE-DEXTROAMPHETAMINE 30 MG PO TABS: 30.0000 mg | ORAL_TABLET | Freq: Every morning | ORAL | 0 refills | Status: DC

## 2021-10-24 NOTE — Progress Notes (Signed)
1 refill of 30 tablets of 30mg  Adderall has been sent to patient's pharmacy. Patient's usual provider is out of office.  PGY-3 , MD

## 2021-10-24 NOTE — Telephone Encounter (Signed)
Done

## 2021-10-24 NOTE — Telephone Encounter (Signed)
Call from patient needing her adderall 30 mg, states she got her 20 mg. I called her [facility] and they have 30 mg is stock and she last filled on 6/26 so she should be out. She has a future appt on 11/29/21. Her provider is not in the office this week, will send this request to Eliseo Gum MD that is covering for Opelika PA.

## 2021-11-19 ENCOUNTER — Telehealth (HOSPITAL_COMMUNITY): Payer: Self-pay | Admitting: *Deleted

## 2021-11-19 ENCOUNTER — Other Ambulatory Visit (HOSPITAL_COMMUNITY): Payer: Self-pay | Admitting: Physician Assistant

## 2021-11-19 ENCOUNTER — Telehealth (HOSPITAL_COMMUNITY): Payer: Self-pay

## 2021-11-19 DIAGNOSIS — F902 Attention-deficit hyperactivity disorder, combined type: Secondary | ICD-10-CM

## 2021-11-19 MED ORDER — AMPHETAMINE-DEXTROAMPHETAMINE 20 MG PO TABS: 20.0000 mg | ORAL_TABLET | Freq: Every day | ORAL | 0 refills | Status: DC

## 2021-11-19 NOTE — Telephone Encounter (Signed)
Patient LVM stated that she thought Adderall  20 mg was sent to Rx but they said don't have script

## 2021-11-19 NOTE — Telephone Encounter (Signed)
Please forward this concern to Meta Hatchet, PA as this is his patient.

## 2021-11-19 NOTE — Progress Notes (Signed)
Provider was contacted by Rushie Chestnut, RMA guarding patient's message left on voicemail stating that she has not received prescriptions for her Adderall 20 mg that she takes in the evening.  Provider to prescribe patient's medication to pharmacy of choice.

## 2021-11-19 NOTE — Telephone Encounter (Signed)
Provider was contacted by Direce E. McIntyre, RMA guarding patient's message left on voicemail stating that she has not received prescriptions for her Adderall 20 mg that she takes in the evening.  Provider to prescribe patient's medication to pharmacy of choice. 

## 2021-11-20 ENCOUNTER — Telehealth (HOSPITAL_COMMUNITY): Payer: Self-pay | Admitting: *Deleted

## 2021-11-20 ENCOUNTER — Other Ambulatory Visit (HOSPITAL_COMMUNITY): Payer: Self-pay | Admitting: Physician Assistant

## 2021-11-20 DIAGNOSIS — F902 Attention-deficit hyperactivity disorder, combined type: Secondary | ICD-10-CM

## 2021-11-20 MED ORDER — AMPHETAMINE-DEXTROAMPHETAMINE 20 MG PO TABS: 20.0000 mg | ORAL_TABLET | Freq: Every day | ORAL | 0 refills | Status: DC

## 2021-11-20 MED ORDER — AMPHETAMINE-DEXTROAMPHETAMINE 30 MG PO TABS: 30.0000 mg | ORAL_TABLET | Freq: Every morning | ORAL | 0 refills | Status: DC

## 2021-11-20 NOTE — Telephone Encounter (Signed)
VM from patient stating her adderall rx was not at the pharmacy. Per providers note she takes a 30 and a 20 mg daily. The chart indicates she was called in 20 mg yesterday but not the 30 mg. Will forward this concern to her provider.

## 2021-11-20 NOTE — Progress Notes (Signed)
Patient's medication being recent due to patient's pharmacy not having prescription.

## 2021-11-20 NOTE — Progress Notes (Signed)
Provider was contacted by [name]. Reola Calkins, RN regarding patient's voicemail stating that her medication was not as her pharmacy.  Patient takes Adderall 30 mg in the morning and 20 mg in the evening.  Patient's Adderall 30 mg was not called in at the time of her Adderall 20 mg being called in.  Provider to e-prescribe patient's medication to pharmacy of choice.

## 2021-11-20 NOTE — Telephone Encounter (Signed)
Provider was contacted by [name]. Reola Calkins, RN regarding patient's voicemail stating that her medication was not sent to pharmacy.  Patient's Adderall 30 mg daily was e-prescribed to pharmacy of choice.

## 2021-11-29 ENCOUNTER — Telehealth (INDEPENDENT_AMBULATORY_CARE_PROVIDER_SITE_OTHER): Payer: Medicaid Other | Admitting: Physician Assistant

## 2021-11-29 DIAGNOSIS — F902 Attention-deficit hyperactivity disorder, combined type: Secondary | ICD-10-CM

## 2021-11-29 DIAGNOSIS — F339 Major depressive disorder, recurrent, unspecified: Secondary | ICD-10-CM

## 2021-11-29 MED ORDER — QUETIAPINE FUMARATE 100 MG PO TABS: 100.0000 mg | ORAL_TABLET | Freq: Every day | ORAL | 2 refills | Status: DC

## 2021-11-29 MED ORDER — SERTRALINE HCL 100 MG PO TABS: 100.0000 mg | ORAL_TABLET | Freq: Every day | ORAL | 2 refills | Status: DC

## 2021-11-29 MED ORDER — HYDROXYZINE HCL 25 MG PO TABS: 25.0000 mg | ORAL_TABLET | Freq: Three times a day (TID) | ORAL | 2 refills | Status: AC | PRN

## 2021-12-02 ENCOUNTER — Encounter (HOSPITAL_COMMUNITY): Payer: Self-pay | Admitting: Physician Assistant

## 2021-12-02 NOTE — Progress Notes (Signed)
BH MD/PA/NP OP Progress Note  Virtual Visit via Video Note  I connected with Doris Roberson on 11/29/21 at 1:00 PM EST by a video enabled telemedicine application and verified that I am speaking with the correct person using two identifiers.  Location: Patient: Secure Location Provider: Clinic   I discussed the limitations of evaluation and management by telemedicine and the availability of in person appointments. The patient expressed understanding and agreed to proceed.  Follow Up Instructions:   I discussed the assessment and treatment plan with the patient. The patient was provided an opportunity to ask questions and all were answered. The patient agreed with the plan and demonstrated an understanding of the instructions.   The patient was advised to call back or seek an in-person evaluation if the symptoms worsen or if the condition fails to improve as anticipated.  I provided 7 minutes of non-face-to-face time during this encounter.  Meta Hatchet, PA    11/29/2021 1:30 PM Doris Roberson  MRN:  [id number]  Chief Complaint:  Chief Complaint  Patient presents with   Follow-up   Medication Refill   HPI:   Doris Roberson is a 31 year old female with a past psychiatric history significant for major depressive disorder with anxious distress and attention deficit hyperactivity disorder who presents to [facility] via virtual video visit for follow-up of medication management.  Patient is currently being managed on the following medications:  Adderall 30 mg in the morning/Adderall 20 mg in the evening Seroquel 100 mg at bedtime Hydroxyzine 25 mg 3 times daily as needed Sertraline 100 mg daily  Patient reports that her medications are fine.  She does report some issues with concentration and some difficulty with receiving her Adderall in on time.  Patient denies experiencing depressive symptoms or anxiety.  Patient denies  any new stressors at this time.  A GAD-7 screen was performed with the patient scoring a 2.  Patient is alert and oriented x4, calm, cooperative, and fully engaged in conversation during the encounter.  Patient endorses good mood.  Patient denies suicidal or homicidal ideations.  She further denies auditory or visual hallucinations and does not appear to be responding to internal plus external stimuli.  Patient endorses good sleep and receives on average 7 hours of sleep each night.  Patient endorses good appetite and eats on average 3 meals per day.  Patient denies alcohol consumption or illicit drug use.  Patient endorses tobacco use and smokes on average 4 to 5 cigarettes/day.  Visit Diagnosis:    ICD-10-CM   1. Major depressive disorder, recurrent episode with anxious distress (HCC)  F33.9 sertraline (ZOLOFT) 100 MG tablet    QUEtiapine (SEROQUEL) 100 MG tablet    hydrOXYzine (ATARAX) 25 MG tablet      Past Psychiatric History:  Bipolar disorder vs Major depressive disorder Polysubstance dependence  Past Medical History:  Past Medical History:  Diagnosis Date   Alcohol abuse    s/p inpatient rehab at [facility] in 2011, two DUI's   Depression    h/o Tylenol pm overdose, subsequent admision to behavioral health   Mood disorder [facility])     Past Surgical History:  Procedure Laterality Date   ANTERIOR CRUCIATE LIGAMENT REPAIR Left 2008   CESAREAN SECTION N/A 05/07/2016   Procedure: CESAREAN SECTION;  Surgeon: Christeen Douglas, MD;  Location: [facility] ORS;  Service: Obstetrics;  Laterality: N/A;  Female born @ 38 Apgars: 9/9 Weight:7lb 11oz    Family Psychiatric  History:  Denied  Family History:  Family History  Problem Relation Age of Onset   Alcohol abuse Other    Heart disease Father     Social History:  Social History   Socioeconomic History   Marital status: Single    Spouse name: Not on file   Number of children: Not on file   Years of education: Not on file    Highest education level: Not on file  Occupational History   Not on file  Tobacco Use   Smoking status: Every Day    Packs/day: 0.50    Types: Cigarettes   Smokeless tobacco: Never  Substance and Sexual Activity   Alcohol use: No   Drug use: No   Sexual activity: Yes  Other Topics Concern   Not on file  Social History Narrative   Not on file   Social Determinants of Health   Financial Resource Strain: Not on file  Food Insecurity: Not on file  Transportation Needs: Not on file  Physical Activity: Not on file  Stress: Not on file  Social Connections: Not on file    Allergies:  Allergies  Allergen Reactions   Other Other (See Comments)    An abx, doesn't remember name. Stomach upset    Nitrofurantoin Rash    Metabolic Disorder Labs: No results found for: "HGBA1C", "MPG" No results found for: "PROLACTIN" No results found for: "CHOL", "TRIG", "HDL", "CHOLHDL", "VLDL", "LDLCALC" Lab Results  Component Value Date   TSH 1.93 03/12/2010    Therapeutic Level Labs: No results found for: "LITHIUM" No results found for: "VALPROATE" No results found for: "CBMZ"  Current Medications: Current Outpatient Medications  Medication Sig Dispense Refill   amphetamine-dextroamphetamine (ADDERALL) 20 MG tablet Take 1 tablet (20 mg total) by mouth daily. 30 tablet 0   amphetamine-dextroamphetamine (ADDERALL) 30 MG tablet Take 1 tablet by mouth every morning. 30 tablet 0   clindamycin (CLEOCIN T) 1 % external solution Apply to face every morning for acne 60 mL 3   DIFFERIN 0.3 % gel Apply a Roberson-sized amount to face every night as tolerated for acne. 45 g 3   doxycycline (MONODOX) 100 MG capsule Take 1 capsule (100 mg total) by mouth 2 (two) times daily. Take with food. 60 capsule 2   ELIDEL 1 % cream Apply to rash on arm twice a day until improved. 60 g 1   hydrOXYzine (ATARAX) 25 MG tablet Take 1 tablet (25 mg total) by mouth 3 (three) times daily as needed. 75 tablet 2    ketoconazole (NIZORAL) 2 % cream Apply to left arm twice a day x 2 weeks. 60 g 0   QUEtiapine (SEROQUEL) 100 MG tablet Take 1 tablet (100 mg total) by mouth at bedtime. 30 tablet 2   sertraline (ZOLOFT) 100 MG tablet Take 1 tablet (100 mg total) by mouth daily. 30 tablet 2   No current facility-administered medications for this visit.     Musculoskeletal: Strength & Muscle Tone: Unable to assess due to telemedicine visit Gait & Station: Unable to assess due to telemedicine visit Patient leans: Unable to assess due to telemedicine visit  Psychiatric Specialty Exam: Review of Systems  Psychiatric/Behavioral:  Positive for decreased concentration. Negative for dysphoric mood, hallucinations, self-injury, sleep disturbance and suicidal ideas. The patient is not nervous/anxious and is not hyperactive.     unknown if currently breastfeeding.There is no height or weight on file to calculate BMI.  General Appearance: Well Groomed  Eye Contact:  Good  Speech:  Clear and Coherent and Normal Rate  Volume:  Normal  Mood:  Euthymic  Affect:  Appropriate  Thought Process:  Coherent and Descriptions of Associations: Intact  Orientation:  Full (Time, Place, and Person)  Thought Content: WDL   Suicidal Thoughts:  No  Homicidal Thoughts:  No  Memory:  Immediate;   Good Recent;   Good Remote;   Good  Judgement:  Fair  Insight:  Fair  Psychomotor Activity:  Normal  Concentration:  Concentration: Good and Attention Span: Good  Recall:  Good  Fund of Knowledge: Good  Language: Good  Akathisia:  No  Handed:  Right  AIMS (if indicated): not done  Assets:  Communication Skills Desire for Improvement Financial Resources/Insurance Housing Vocational/Educational  ADL's:  Intact  Cognition: WNL  Sleep:  Good   Screenings: GAD-7    Flowsheet Row Video Visit from 11/29/2021 in [facility] Video Visit from 08/30/2021 in [facility] Video  Visit from 05/31/2021 in [facility] Video Visit from 03/29/2021 in [facility] Video Visit from 01/04/2021 in [facility]  Total GAD-7 Score 2 1 4 6 9       PHQ2-9    Flowsheet Row Video Visit from 11/29/2021 in [facility] Video Visit from [facility]/30/2021 in [facility] Video Visit from 05/31/2021 in [facility] Video Visit from 03/29/2021 in [facility] Video Visit from 01/04/2021 in [facility]  PHQ-2 Total Score 0 1 1 0 0      Flowsheet Row Video Visit from 11/29/2021 in [facility] Video Visit from 08/30/2021 in [facility] Video Visit from 05/31/2021 in [facility]  C-SSRS RISK CATEGORY No Risk No Risk No Risk        Assessment and Plan:   Doris Roberson is a 31 year old female with a past psychiatric history significant for major depressive disorder with anxious distress and attention deficit hyperactivity disorder who presents to [facility] via virtual video visit for follow-up of medication management.  Patient reports no issues or concerns regarding her medications except for Adderall.  She reports that she has been having some issues with concentration and also notes that it has been difficult getting her medications due to taking 2 different dosages of her Adderall.  Provider to make the recommendation for patient to take Adderall 30 mg 2 times daily for the management of her ADHD symptoms.  Patient was agreeable to recommendation.  Patient's medications to be e-prescribed to pharmacy of choice.  Collaboration of Care: Collaboration of Care: Medication Management AEB provider managing patient psychiatric medications and Psychiatrist AEB  patient being followed by a mental health provider.  Patient/Guardian was advised Release of Information must be obtained prior to any record release in order to collaborate their care with an outside provider. Patient/Guardian was advised if they have not already done so to contact the registration department to sign all necessary forms in order for [facility] to release information regarding their care.   Consent: Patient/Guardian gives verbal consent for treatment and assignment of benefits for services provided during this visit. Patient/Guardian expressed understanding and agreed to proceed.   1. Major depressive disorder, recurrent episode with anxious distress (HCC)  - sertraline (ZOLOFT) 100 MG tablet; Take 1 tablet (100 mg total) by mouth daily.  Dispense: 30 tablet; Refill: 2 - QUEtiapine (SEROQUEL) 100 MG tablet; Take 1 tablet (100 mg total) by mouth at bedtime.  Dispense: 30 tablet; Refill: 2 - hydrOXYzine (ATARAX) 25 MG tablet; Take 1 tablet (25 mg total) by mouth 3 (three) times daily as needed.  Dispense: 75 tablet; Refill: 2  2. Attention deficit hyperactivity disorder (ADHD), combined type Due to patient with attention and difficulty with receiving medications from her pharmacy, patient's Adderall to be readjusted to 30 mg 2 times daily.  Patient to follow up in 3 months Provider spent a total of 7 minutes with the patient/reviewing patient's chart  Meta Hatchet, PA 11/29/2021, 1:30 PM

## 2021-12-06 ENCOUNTER — Telehealth (HOSPITAL_COMMUNITY): Payer: Self-pay | Admitting: *Deleted

## 2021-12-06 NOTE — Telephone Encounter (Signed)
PA requested by [facility] pharmacy for her quetiapine. PA approved and will notify pharmacy when they open this am.

## 2021-12-18 ENCOUNTER — Other Ambulatory Visit (HOSPITAL_COMMUNITY): Payer: Self-pay | Admitting: Physician Assistant

## 2021-12-18 DIAGNOSIS — F902 Attention-deficit hyperactivity disorder, combined type: Secondary | ICD-10-CM

## 2021-12-18 MED ORDER — AMPHETAMINE-DEXTROAMPHETAMINE 30 MG PO TABS: 30.0000 mg | ORAL_TABLET | Freq: Two times a day (BID) | ORAL | 0 refills | Status: DC

## 2021-12-18 NOTE — Progress Notes (Signed)
Provider refilling patient's Adderall prescription.  Provider to also refill subsequent future Adderall prescriptions a month apart from each other.

## 2022-02-27 NOTE — Progress Notes (Addendum)
BH MD Outpatient Progress Note  02/28/2022 8:37 AM Doris Roberson  MRN:  [id number]  Assessment:  Doris Roberson presents for follow-up evaluation. Today, 02/28/22, patient reports continued psychiatric stability and remission of depressive symptoms. She tolerated increase in Adderall well from last visit and denies adverse effects. Discussed maximal dosing of Adderall and that if ADHD symptoms are uncontrolled, can consider transition to long acting formulation to provide better coverage. However, she feels medication at current dose is currently effective and denies desire for changes at this time. Discussed need for in person visit at next appointment to obtain updated vitals and weight; she reports these were normal at recent PCP visit. No changes to plan of care at this time.  Plan to RTC in 3 months in person; plan for coverage while this writer is on leave was discussed.   Identifying Information: Doris Roberson is a 31 y.o. 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   Plan:  # MDD with anxious distress in remission  Sleep Past medication trials: Wellbutrin, Remeron, trazodone Status of problem: stable Interventions: -- Continue sertraline 100 mg daily (rx by PCP) -- Continue Seroquel 100 mg at bedtime (rx by PCP) -- Continue hydroxyzine 25 mg TID PRN anxiety (rx by PCP)  # Historical diagnosis of ADHD Past medication trials: Strattera, methylphenidate (ineffective) Status of problem: stable Interventions: -- Continue Adderall 30 mg qAM + 30 mg qAfternoon  -- Reports normal vitals (BP 115/70; normal HR) at recent PCP appointment on 02/24/22; plan for in person appointment at next visit -- Not addressed today but would recommend checking in regarding use of contraception/family planning  # Opioid  use disorder in remission Past medication trials: Suboxone Status of problem: in remission; last use Oct 2020 Interventions: -- Continue to promote ongoing cessation; denies any urges/cravings at this time  Patient was given contact information for behavioral health clinic and was instructed to call 911 for emergencies.   Subjective:  Chief Complaint:  Chief Complaint  Patient presents with   Medication Management    Interval History:   Patient last seen by Otila BackEddie Nwoko, PA on 11/29/21. At that time, managed on: Sertraline 100 mg daily  Seroquel 100 mg at bedtime Adderall 30 mg in the morning/Adderall 20 mg in the evening Hydroxyzine 25 mg TID PRN anxiety At that time, Adderall increased to 30 mg BID.   Today, patient reports she has been doing well and denies any issues or concerns. Hasn't noticed much difference with increase in dose of Adderall however states being on equivalent BID dosing has made it much easier to obtain rx from pharmacy. Denies any adverse effects including insomnia, HA, chest pain, tachycardia, appetite decrease or weight loss. Last saw PCP on Monday and was told BP was 115/70 and HR was normal.   Mood overall has been good and denies feeling depressed or anxious. Denies SI, HI, AVH. Sleeping 7-8 hours nightly. Finds Seroquel helpful. Denies dizziness or constipation. Does endorse appetite increase associated with Seroquel but has become accustomed to this. Uses Atarax infrequently because makes her groggy.   Reports she was first diagnosed with ADHD by her PCP in 2012 due to issues with focus and attention in school at the time; didn't receive neuropsychological testing. Was initially trialed on Ritalin but was ineffective; reports she has been on Adderall for a few  years now and identifies significant benefit. Discussed maximum dosing of current Adderall rx and that if she notes medication wearing off, can consider switch to ER in the future as this may provide  better coverage and allow for lower dosing. At this time, feels medication is working well and prefers to remain at current dose.   PDMP: -- Adderall 30 mg QTY 60 last filled 02/18/22; rx dating back to Dec 2021  Visit Diagnosis:    ICD-10-CM   1. Attention deficit hyperactivity disorder (ADHD), combined type  F90.2 amphetamine-dextroamphetamine (ADDERALL) 30 MG tablet    amphetamine-dextroamphetamine (ADDERALL) 30 MG tablet    amphetamine-dextroamphetamine (ADDERALL) 30 MG tablet    2. MDD (major depressive disorder), recurrent, in full remission (HCC)  F33.42     3. Opioid use disorder in remission  F11.91       Past Psychiatric History:  Diagnoses: MDD with anxious distress, ADHD Medication trials: Wellbutrin, Remeron, trazodone, Strattera, Suboxone in October 2020, methylphenidate (ineffective) Hospitalizations: denies Suicide attempts: denies Substance use:   -- Denies use of etoh or illicit drugs including cannabis   -- Reports past illicit use of heroin, opioid pain pills, amphetamines, benzodiazepines: last used in Oct 2020; denies cravings or urges to use  -- Tobacco: 3-4 cigarettes/day   Past Medical History:  Past Medical History:  Diagnosis Date   Alcohol abuse    s/p inpatient rehab at [facility] in 2011, two DUI's   Bacteria in urine 04/14/2016   Depression    h/o Tylenol pm overdose, subsequent admision to behavioral health   First trimester screening 10/22/2015   Hyperemesis complicating pregnancy, antepartum 03/15/2016   Labor and delivery, indication for care 05/05/2016   Mood disorder (HCC)    Pregnant state, incidental 08/14/2010   Rh negative status during pregnancy in second trimester 01/31/2016   Overview:   Rhogam given on 02/15/16   Smoking (tobacco) complicating pregnancy, third trimester 01/31/2016   Overview:   Smoking 0.5 PPD, sometimes less 6 cigs/day   Growth [place] every 4 weeks while smoking 0.5 PPD - encouraged to quit  NSTs starting at 36  weeks if she is still smoking 0.5 PPD  Pt is smoking < 1/2 pack a day. TJS stated we do not need NST weekly now.    Tobacco use in pregnancy, antepartum 10/05/2015   Overview:   Smoking 0.5 PPD - smoking cessation encouraged   11/06/15: Nicotine patches prescribed     Past Surgical History:  Procedure Laterality Date   ANTERIOR CRUCIATE LIGAMENT REPAIR Left 2008   CESAREAN SECTION N/A 05/07/2016   Procedure: CESAREAN SECTION;  Surgeon: Christeen Douglas, MD;  Location: [facility] ORS;  Service: Obstetrics;  Laterality: N/A;  Female born @ 42 Apgars: 9/9 Weight:7lb 11oz    Family Psychiatric History: denies  Family History:  Family History  Problem Relation Age of Onset   Alcohol abuse Other    Heart disease Father     Social History:  Social History   Socioeconomic History   Marital status: Single    Spouse name: Not on file   Number of children: Not on file   Years of education: Not on file   Highest education level: Not on file  Occupational History   Not on file  Tobacco Use   Smoking status: Every Day    Packs/day: 0.25    Types: Cigarettes   Smokeless tobacco: Never  Substance and Sexual Activity   Alcohol use: No   Drug use: Not Currently  Comment: last use October 2020   Sexual activity: Yes  Other Topics Concern   Not on file  Social History Narrative   Not on file   Social Determinants of Health   Financial Resource Strain: Not on file  Food Insecurity: Not on file  Transportation Needs: Not on file  Physical Activity: Not on file  Stress: Not on file  Social Connections: Not on file    Allergies:  Allergies  Allergen Reactions   Other Other (See Comments)    An abx, doesn't remember name. Stomach upset    Nitrofurantoin Rash    Current Medications: Current Outpatient Medications  Medication Sig Dispense Refill   amphetamine-dextroamphetamine (ADDERALL) 30 MG tablet Take 1 tablet by mouth 2 (two) times daily. 60 tablet 0   QUEtiapine (SEROQUEL)  100 MG tablet Take 1 tablet (100 mg total) by mouth at bedtime. 30 tablet 2   sertraline (ZOLOFT) 100 MG tablet Take 1 tablet (100 mg total) by mouth daily. 30 tablet 2   [START ON 04/19/2022] amphetamine-dextroamphetamine (ADDERALL) 30 MG tablet Take 1 tablet by mouth 2 (two) times daily. 60 tablet 0   [START ON 03/20/2022] amphetamine-dextroamphetamine (ADDERALL) 30 MG tablet Take 1 tablet by mouth 2 (two) times daily. 60 tablet 0   [START ON 05/19/2022] amphetamine-dextroamphetamine (ADDERALL) 30 MG tablet Take 1 tablet by mouth 2 (two) times daily. 60 tablet 0   clindamycin (CLEOCIN T) 1 % external solution Apply to face every morning for acne 60 mL 3   DIFFERIN 0.3 % gel Apply a pea-sized amount to face every night as tolerated for acne. 45 g 3   doxycycline (MONODOX) 100 MG capsule Take 1 capsule (100 mg total) by mouth 2 (two) times daily. Take with food. 60 capsule 2   ELIDEL 1 % cream Apply to rash on arm twice a day until improved. 60 g 1   hydrOXYzine (ATARAX) 25 MG tablet Take 1 tablet (25 mg total) by mouth 3 (three) times daily as needed. (Patient not taking: Reported on 02/28/2022) 75 tablet 2   ketoconazole (NIZORAL) 2 % cream Apply to left arm twice a day x 2 weeks. 60 g 0   No current facility-administered medications for this visit.    ROS: Denies insomnia, HA, chest pain, tachycardia, appetite decrease or weight loss  Objective:  Psychiatric Specialty Exam: unknown if currently breastfeeding.There is no height or weight on file to calculate BMI.  General Appearance: Casual and Well Groomed  Eye Contact:  Good  Speech:  Clear and Coherent and Normal Rate  Volume:  Normal  Mood:   "good"  Affect:   Euthymic; calm  Thought Content:  Denies AVH; IOR; paranoia    Suicidal Thoughts:  No  Homicidal Thoughts:  No  Thought Process:  Goal Directed and Linear  Orientation:  Full (Time, Place, and Person)    Memory:   Grossly intact  Judgment:  Good  Insight:  Good   Concentration:  Concentration: Good  Recall:  NA  Fund of Knowledge: Good  Language: Good  Psychomotor Activity:  Normal  Akathisia:  No  AIMS (if indicated): not done  Assets:  Communication Skills Desire for Improvement Housing Leisure Time Physical Health Talents/Skills Transportation Vocational/Educational  ADL's:  Intact  Cognition: WNL  Sleep:  Good   PE: General: sits comfortably in view of camera; no acute distress  Pulm: no increased work of breathing on room air  MSK: all extremity movements appear intact  Neuro: no focal neurological  deficits observed  Gait & Station: unable to assess by video    Metabolic Disorder Labs: No results found for: "HGBA1C", "MPG" No results found for: "PROLACTIN" No results found for: "CHOL", "TRIG", "HDL", "CHOLHDL", "VLDL", "LDLCALC" Lab Results  Component Value Date   TSH 1.93 03/12/2010    Therapeutic Level Labs: No results found for: "LITHIUM" No results found for: "VALPROATE" No results found for: "CBMZ"  Screenings:  GAD-7    Flowsheet Row Video Visit from 11/29/2021 in [facility] Video Visit from 08/30/2021 in [facility] Video Visit from 05/31/2021 in [facility] Video Visit from 03/29/2021 in [facility] Video Visit from 01/04/2021 in [facility]  Total GAD-7 Score 2 1 4 6 9       PHQ2-9    Flowsheet Row Video Visit from 11/29/2021 in [facility] Video Visit from 08/30/2021 in [facility] Video Visit from 05/31/2021 in [facility] Video Visit from 03/29/2021 in [facility] Video Visit from 01/04/2021 in [facility]  PHQ-2 Total Score 0 1 1 0 0      Flowsheet Row Video Visit from 11/29/2021 in [facility] Video  Visit from 08/30/2021 in [facility] Video Visit from 05/31/2021 in [facility]  C-SSRS RISK CATEGORY No Risk No Risk No Risk       Collaboration of Care: Collaboration of Care: Medication Management AEB ongoing medication management and Psychiatrist AEB established with this provider  Patient/Guardian was advised Release of Information must be obtained prior to any record release in order to collaborate their care with an outside provider. Patient/Guardian was advised if they have not already done so to contact the registration department to sign all necessary forms in order for [facility] to release information regarding their care.   Consent: Patient/Guardian gives verbal consent for treatment and assignment of benefits for services provided during this visit. Patient/Guardian expressed understanding and agreed to proceed.   Televisit via video: I connected with patient on 02/28/22 at  8:00 AM EST by a video enabled telemedicine application and verified that I am speaking with the correct person using two identifiers.  Location: Patient: [place] Provider: remote office in [place]   I discussed the limitations of evaluation and management by telemedicine and the availability of in person appointments. The patient expressed understanding and agreed to proceed.  I discussed the assessment and treatment plan with the patient. The patient was provided an opportunity to ask questions and all were answered. The patient agreed with the plan and demonstrated an understanding of the instructions.   The patient was advised to call back or seek an in-person evaluation if the symptoms worsen or if the condition fails to improve as anticipated.  I provided 45 minutes of non-face-to-face time during this encounter.  Nita Whitmire A  02/28/2022, 8:37 AM

## 2022-02-27 NOTE — Patient Instructions (Signed)
Thank you for attending your appointment today.  -- We did not make any medication changes today. Please continue medications as prescribed.  Please do not make any changes to medications without first discussing with your provider. If you are experiencing a psychiatric emergency, please call 911 or present to your nearest emergency department. Additional crisis, medication management, and therapy resources are included below.  [facility]  [address], [place], [place] [address] [phone] WALK-IN URGENT CARE 24/7 FOR ANYONE [address], [place], [place]  [phone] Fax: [phone] [email or web address] *Interpreters available *Accepts all insurance and uninsured for Urgent Care needs *Accepts Medicaid and uninsured for outpatient treatment (below)      ONLY FOR [place] Residents  Below:    Outpatient New Patient Assessment/Therapy Walk-ins:        Monday -Thursday 8am until slots are full.        Every Friday 1pm-4pm  (first come, first served)                   New Patient Psychiatry/Medication Management        Monday-Friday 8am-11am (first come, first served)               For all walk-ins we ask that you arrive by 7:15am, because patients will be seen in the order of arrival.   

## 2022-02-28 ENCOUNTER — Encounter (HOSPITAL_COMMUNITY): Payer: Self-pay | Admitting: Psychiatry

## 2022-02-28 ENCOUNTER — Telehealth (INDEPENDENT_AMBULATORY_CARE_PROVIDER_SITE_OTHER): Payer: Medicaid Other | Admitting: Psychiatry

## 2022-02-28 ENCOUNTER — Telehealth (HOSPITAL_COMMUNITY): Payer: Medicaid Other | Admitting: Physician Assistant

## 2022-02-28 DIAGNOSIS — F902 Attention-deficit hyperactivity disorder, combined type: Secondary | ICD-10-CM | POA: Diagnosis not present

## 2022-02-28 DIAGNOSIS — F1191 Opioid use, unspecified, in remission: Secondary | ICD-10-CM | POA: Insufficient documentation

## 2022-02-28 DIAGNOSIS — F3342 Major depressive disorder, recurrent, in full remission: Secondary | ICD-10-CM | POA: Diagnosis not present

## 2022-02-28 MED ORDER — AMPHETAMINE-DEXTROAMPHETAMINE 30 MG PO TABS: 30.0000 mg | ORAL_TABLET | Freq: Two times a day (BID) | ORAL | 0 refills | Status: DC

## 2022-05-30 ENCOUNTER — Telehealth (INDEPENDENT_AMBULATORY_CARE_PROVIDER_SITE_OTHER): Payer: Medicaid Other | Admitting: Physician Assistant

## 2022-05-30 ENCOUNTER — Telehealth (HOSPITAL_COMMUNITY): Payer: Medicaid Other | Admitting: Psychiatry

## 2022-05-30 DIAGNOSIS — F339 Major depressive disorder, recurrent, unspecified: Secondary | ICD-10-CM | POA: Diagnosis not present

## 2022-05-30 DIAGNOSIS — F902 Attention-deficit hyperactivity disorder, combined type: Secondary | ICD-10-CM

## 2022-06-02 ENCOUNTER — Encounter (HOSPITAL_COMMUNITY): Payer: Self-pay | Admitting: Physician Assistant

## 2022-06-02 DIAGNOSIS — F339 Major depressive disorder, recurrent, unspecified: Secondary | ICD-10-CM | POA: Insufficient documentation

## 2022-06-02 NOTE — Progress Notes (Signed)
BH MD/PA/NP OP Progress Note  Virtual Visit via Video Note  I connected with Doris Roberson on 06/02/22 at  2:30 PM EST by a video enabled telemedicine application and verified that I am speaking with the correct person using two identifiers.  Location: Patient: Home Provider: Clinic   I discussed the limitations of evaluation and management by telemedicine and the availability of in person appointments. The patient expressed understanding and agreed to proceed.  Follow Up Instructions:  I discussed the assessment and treatment plan with the patient. The patient was provided an opportunity to ask questions and all were answered. The patient agreed with the plan and demonstrated an understanding of the instructions.   The patient was advised to call back or seek an in-person evaluation if the symptoms worsen or if the condition fails to improve as anticipated.  I provided 7 minutes of non-face-to-face time during this encounter.  Doris Mood, PA    06/02/2022 6:22 AM Doris Roberson  MRN:  [id number]  Chief Complaint:  Chief Complaint  Patient presents with   Follow-up   HPI:   Doris Roberson is a 32 year old, Caucasian female with a past psychiatric history significant for attention deficit hyperactivity disorder (combined type) and major depressive disorder who presents to [facility] via virtual video visit for follow-up and medication management.  Patient was last seen by Alda Berthold, MD on 02/28/2022. During her last encounter, patient was being managed on the following psychiatric medications:  Adderall 30 mg 2 times daily Seroquel 100 mg at bedtime Sertraline 100 mg daily  Patient reports no issues or concerns regarding her current medication regimen.  Patient denies experiencing any adverse side effects and denies the need for dosage adjustments at this time.  Patient denies depression or anxiety.  Patient  further denies any new stressors at this time.  A GAD-7 screen was performed with the patient scoring a 4.  Patient is alert and oriented x 4, calm, cooperative, and fully engaged in conversation during the encounter.  Patient endorses good Roberson.  Patient denies suicidal or homicidal ideations.  She further denies auditory or visual hallucinations and does not appear to be responding to internal/external stimuli.  Patient endorses good sleep and receives on average 8 hours of sleep each night.  Patient endorses good appetite and eats on average 3 meals per day.  Patient denies alcohol consumption.  Patient endorses tobacco use and smokes a pack every 3 or 4 days.  Patient denies illicit drug use.  Visit Diagnosis:    ICD-10-CM   1. Attention deficit hyperactivity disorder (ADHD), combined type  F90.2     2. MDD (major depressive disorder), recurrent, in full remission ([place])  F33.42       Past Psychiatric History:  Diagnoses: MDD with anxious distress, ADHD Medication trials: Wellbutrin, Remeron, trazodone, Strattera, Suboxone in October 2020, methylphenidate (ineffective) Hospitalizations: denies Suicide attempts: denies Substance use:              -- Denies use of etoh or illicit drugs including cannabis                         -- Reports past illicit use of heroin, opioid pain pills, amphetamines, benzodiazepines: last used in Oct 2020; denies cravings or urges to use             -- Tobacco: 3-4 cigarettes/day   Past Medical History:  Past Medical  History:  Diagnosis Date   Alcohol abuse    s/p inpatient rehab at [facility] in 2011, two [place] in urine 04/14/2016   Depression    h/o Tylenol pm overdose, subsequent admision to behavioral health   First trimester screening 10/22/2015   Hyperemesis complicating pregnancy, antepartum 03/15/2016   Labor and delivery, indication for care 05/05/2016   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 TJS stated we do not need NST weekly now.    Tobacco use in pregnancy, antepartum 10/05/2015   Overview:   Smoking 0.5 PPD - smoking cessation encouraged   11/06/15: Nicotine patches prescribed     Past Surgical History:  Procedure Laterality Date   ANTERIOR CRUCIATE LIGAMENT REPAIR Left 2008   CESAREAN SECTION N/A 05/07/2016   Procedure: CESAREAN SECTION;  Surgeon: Benjaman Kindler, MD;  Location: [facility] ORS;  Service: Obstetrics;  Laterality: N/A;  Female born @ 5 Apgars: 9/9 Weight:7lb 11oz    Family Psychiatric History:  Patient denies  Family History:  Family History  Problem Relation Age of Onset   Alcohol abuse Other    Heart disease Father     Social History:  Social History   Socioeconomic History   Marital status: Single    Spouse name: Not on file   Number of children: Not on file   Years of education: Not on file   Highest education level: Not on file  Occupational History   Not on file  Tobacco Use   Smoking status: Every Day    Packs/day: 0.25    Types: Cigarettes   Smokeless tobacco: Never  Substance and Sexual Activity   Alcohol use: No   Drug use: Not Currently    Comment: last use October 2020   Sexual activity: Yes  Other Topics Concern   Not on file  Social History Narrative   Not on file   Social Determinants of Health   Financial Resource Strain: Not on file  Food Insecurity: Not on file  Transportation Needs: Not on file  Physical Activity: Not on file  Stress: Not on file  Social Connections: Not on file    Allergies:  Allergies  Allergen Reactions    Other Other (See Comments)    An abx, doesn't remember name. Stomach upset    Nitrofurantoin Rash    Metabolic Disorder Labs: No results found for: "HGBA1C", "MPG" No results found for: "PROLACTIN" No results found for: "CHOL", "TRIG", "HDL", "CHOLHDL", "VLDL", "LDLCALC" Lab Results  Component Value Date   TSH 1.93 03/12/2010    Therapeutic Level Labs: No results found for: "LITHIUM" No results found for: "VALPROATE" No results found for: "CBMZ"  Current Medications: Current Outpatient Medications  Medication Sig Dispense Refill   amphetamine-dextroamphetamine (ADDERALL) 30 MG tablet Take 1 tablet by mouth 2 (two) times daily. 60 tablet 0   amphetamine-dextroamphetamine (ADDERALL) 30 MG tablet Take 1 tablet by mouth 2 (two) times daily. 60 tablet 0   amphetamine-dextroamphetamine (ADDERALL) 30 MG tablet Take 1 tablet by mouth  2 (two) times daily. 60 tablet 0   amphetamine-dextroamphetamine (ADDERALL) 30 MG tablet Take 1 tablet by mouth 2 (two) times daily. 60 tablet 0   clindamycin (CLEOCIN T) 1 % external solution Apply to face every morning for acne 60 mL 3   DIFFERIN 0.3 % gel Apply a pea-sized amount to face every night as tolerated for acne. 45 g 3   doxycycline (MONODOX) 100 MG capsule Take 1 capsule (100 mg total) by mouth 2 (two) times daily. Take with food. 60 capsule 2   ELIDEL 1 % cream Apply to rash on arm twice a day until improved. 60 g 1   hydrOXYzine (ATARAX) 25 MG tablet Take 1 tablet (25 mg total) by mouth 3 (three) times daily as needed. (Patient not taking: Reported on 02/28/2022) 75 tablet 2   ketoconazole (NIZORAL) 2 % cream Apply to left arm twice a day x 2 weeks. 60 g 0   QUEtiapine (SEROQUEL) 100 MG tablet Take 1 tablet (100 mg total) by mouth at bedtime. 30 tablet 2   sertraline (ZOLOFT) 100 MG tablet Take 1 tablet (100 mg total) by mouth daily. 30 tablet 2   No current facility-administered medications for this visit.     Musculoskeletal: Strength &  Muscle Tone: within normal limits Gait & Station: normal Patient leans: N/A  Psychiatric Specialty Exam: Review of Systems  Psychiatric/Behavioral:  Negative for decreased concentration, dysphoric Roberson, hallucinations, self-injury, sleep disturbance and suicidal ideas. The patient is not nervous/anxious and is not hyperactive.     unknown if currently breastfeeding.There is no height or weight on file to calculate BMI.  General Appearance: Casual  Eye Contact:  Good  Speech:  Clear and Coherent and Normal Rate  Volume:  Normal  Roberson:  Euthymic  Affect:  Appropriate  Thought Process:  Coherent, Goal Directed, and Descriptions of Associations: Intact  Orientation:  Full (Time, Place, and Person)  Thought Content: WDL   Suicidal Thoughts:  No  Homicidal Thoughts:  No  Memory:  Immediate;   Good Recent;   Good Remote;   Good  Judgement:  Good  Insight:  Good  Psychomotor Activity:  Normal  Concentration:  Concentration: Good and Attention Span: Good  Recall:  Good  Fund of Knowledge: Good  Language: Good  Akathisia:  No  Handed:  Right  AIMS (if indicated): not done  Assets:  Communication Skills Desire for Improvement Financial Resources/Insurance Housing Vocational/Educational  ADL's:  Intact  Cognition: WNL  Sleep:  Good   Screenings: GAD-7    Flowsheet Row Video Visit from 05/30/2022 in [facility] Video Visit from 11/29/2021 in [facility] Video Visit from 08/30/2021 in [facility] Video Visit from 05/31/2021 in [facility] Video Visit from 03/29/2021 in [facility]  Total GAD-7 Score '4 2 1 4 6      '$ PHQ2-9    Flowsheet Row Video Visit from 05/30/2022 in [facility] Video Visit from 11/29/2021 in [facility] Video Visit from 08/30/2021 in [facility]  Video Visit from 05/31/2021 in [facility] Video Visit from 03/29/2021 in [place]  PHQ-2 Total Score 0 0 1 1 0      Flowsheet Row Video Visit from 05/30/2022 in [facility] Video Visit from 11/29/2021 in [facility] Video Visit from 08/30/2021 in  [place]  C-SSRS RISK CATEGORY No Risk No Risk No Risk        Assessment and Plan:   Lashaundra R. Cichon is a 32 year old, Caucasian female with a past psychiatric history significant for attention deficit hyperactivity disorder (combined type) and major depressive disorder who presents to [facility] via virtual video visit for follow-up and medication management.  Patient reports no issues or concerns regarding her current medication regimen.  Patient denies the need for dosage adjustments at this time and is requesting no refills at this time.  Patient denies depression or anxiety and further denies any new stressors.  Patient to continue taking her medications as prescribed.  Collaboration of Care: Collaboration of Care: Medication Management AEB provider managing patient's psychiatric medications and Psychiatrist AEB patient being followed by mental health provider at this facility  Patient/Guardian was advised Release of Information must be obtained prior to any record release in order to collaborate their care with an outside provider. Patient/Guardian was advised if they have not already done so to contact the registration department to sign all necessary forms in order for [place] to release information regarding their care.   Consent: Patient/Guardian gives verbal consent for treatment and assignment of benefits for services provided during this visit. Patient/Guardian expressed understanding and agreed to proceed.   1. Attention deficit hyperactivity disorder  (ADHD), combined type Patient to continue taking Adderall 30 mg 2 times daily for the management of her attention deficit hyperactivity disorder (combined type)  2. Major depressive disorder, recurrent episode with anxious distress ([place]) Patient to continue taking Seroquel 100 mg at bedtime for the management of her major depressive disorder with anxious distress Patient to continue taking sertraline 100 mg daily for the management of her major depressive disorder with anxious distress  Patient to follow-up in 3 months Provider spent a total of 7 minutes with the patient/reviewing patient's chart  Doris Mood, PA 06/02/2022, 6:22 AM

## 2022-06-12 ENCOUNTER — Other Ambulatory Visit (HOSPITAL_COMMUNITY): Payer: Self-pay | Admitting: Physician Assistant

## 2022-06-12 DIAGNOSIS — F902 Attention-deficit hyperactivity disorder, combined type: Secondary | ICD-10-CM

## 2022-06-12 DIAGNOSIS — F339 Major depressive disorder, recurrent, unspecified: Secondary | ICD-10-CM

## 2022-06-12 MED ORDER — AMPHETAMINE-DEXTROAMPHETAMINE 30 MG PO TABS: 30.0000 mg | ORAL_TABLET | Freq: Two times a day (BID) | ORAL | 0 refills | Status: DC

## 2022-06-12 MED ORDER — SERTRALINE HCL 100 MG PO TABS: 100.0000 mg | ORAL_TABLET | Freq: Every day | ORAL | 2 refills | Status: DC

## 2022-06-12 MED ORDER — QUETIAPINE FUMARATE 100 MG PO TABS: 100.0000 mg | ORAL_TABLET | Freq: Every day | ORAL | 2 refills | Status: DC

## 2022-06-12 NOTE — Progress Notes (Signed)
Provider was contacted by Doris Roberson regarding medication refill.  Patient's medications to be e-prescribed to pharmacy of choice.

## 2022-07-11 ENCOUNTER — Other Ambulatory Visit (HOSPITAL_COMMUNITY): Payer: Self-pay | Admitting: Physician Assistant

## 2022-07-11 ENCOUNTER — Telehealth (HOSPITAL_COMMUNITY): Payer: Self-pay | Admitting: *Deleted

## 2022-07-11 DIAGNOSIS — F902 Attention-deficit hyperactivity disorder, combined type: Secondary | ICD-10-CM

## 2022-07-11 MED ORDER — AMPHETAMINE-DEXTROAMPHETAMINE 30 MG PO TABS: 30.0000 mg | ORAL_TABLET | Freq: Two times a day (BID) | ORAL | 0 refills | Status: DC

## 2022-07-11 NOTE — Telephone Encounter (Signed)
Message acknowledged and reviewed. Patient's medication to be e-prescribed to pharmacy of choice.

## 2022-07-11 NOTE — Telephone Encounter (Signed)
Patient called asking for refill of Adderall. Chart reviewed, next appointment 08/29/22.

## 2022-07-11 NOTE — Progress Notes (Signed)
Provider was contacted by Elder Love, RN regarding patient's medication refill request. Patient's medication to be e-prescribed to pharmacy of choice.

## 2022-07-30 ENCOUNTER — Ambulatory Visit: Payer: Medicaid Other | Admitting: Dermatology

## 2022-07-30 VITALS — BP 112/80 | HR 54

## 2022-07-30 DIAGNOSIS — Z79899 Other long term (current) drug therapy: Secondary | ICD-10-CM | POA: Diagnosis not present

## 2022-07-30 DIAGNOSIS — L237 Allergic contact dermatitis due to plants, except food: Secondary | ICD-10-CM

## 2022-07-30 MED ORDER — CLOBETASOL PROPIONATE 0.05 % EX CREA: TOPICAL_CREAM | CUTANEOUS | 0 refills | Status: DC

## 2022-07-30 MED ORDER — PREDNISONE 5 MG PO TABS: ORAL_TABLET | ORAL | 0 refills | Status: DC

## 2022-07-30 NOTE — Patient Instructions (Addendum)
3 Week Prednisone Taper  You will be given a prescription for 150 tablets of oral Prednisone. It is very important that you take this according to the exact schedule provided below. This type of regimen for taking medication is often called a "taper", because your dosage will steadily decrease over a two week period until it is discontinued altogether.  ALWAYS take this medicine with food to prevent it from irritating your stomach. You should also take your Prednisone during morning hours.  Call the clinic at [phone] if you gain more than two pounds in one day, notice swelling anywhere on your body, have shortness of breath, black or red bowel movements, brown or red vomitus, desire to drink large amounts of fluids, a fever, or extreme weakness.   Oral Prednisone over Three Weeks  Day  Week 1  Week 2  Week 3   1  12  tablets  9 tablets  5 tablets   2  12 tablets  8 tablets  5 tablets   3  11 tablets  8 tablets  4 tablets   4  11 tablets  7 tablets  4 tablets   5  10 tablets  7 tablets  3 tablets   6  10 tablets  6 tablets  2 tablets   7  9 tablets  6 tablets  1 tablet   Risks of prednisone taper discussed including mood irritability, insomnia, weight gain, stomach ulcers, increased risk of infection, increased blood sugar (diabetes), hypertension, osteoporosis with long-term or frequent use, and rare risk of avascular necrosis of the hip.   Start Clobetasol Cream - Apply to areas of itchy rash once to twice daily until improved. Avoid face, groin, underarms, skin folds.  Topical steroids (such as triamcinolone, fluocinolone, fluocinonide, mometasone, clobetasol, halobetasol, betamethasone, hydrocortisone) can cause thinning and lightening of the skin if they are used for too long in the same area. Your physician has selected the right strength medicine for your problem and area affected on the body. Please use your medication only as directed by your physician to prevent side effects.     Due to recent changes in healthcare laws, you may see results of your pathology and/or laboratory studies on MyChart before the doctors have had a chance to review them. We understand that in some cases there may be results that are confusing or concerning to you. Please understand that not all results are received at the same time and often the doctors may need to interpret multiple results in order to provide you with the best plan of care or course of treatment. Therefore, we ask that you please give [place] 2 business days to thoroughly review all your results before contacting the office for clarification. Should we see a critical lab result, you will be contacted sooner.   If You Need Anything After Your Visit  If you have any questions or concerns for your doctor, please call our main line at [phone] and press option 4 to reach your doctor's medical assistant. If no one answers, please leave a voicemail as directed and we will return your call as soon as possible. Messages left after 4 pm will be answered the following business day.   You may also send [place] a message via MyChart. We typically respond to MyChart messages within 1-2 business days.  For prescription refills, please ask your pharmacy to contact our office. Our fax number is [phone].  If you have an urgent issue when the clinic is closed that  cannot wait until the next business day, you can page your doctor at the number below.    Please note that while we do our best to be available for urgent issues outside of office hours, we are not available 24/7.   If you have an urgent issue and are unable to reach us, you may choose to seek medical care at your doctor's office, retail clinic, urgent care center, or emergency room.  If you have a medical emergency, please immediately call 911 or go to the emergency department.  Pager Numbers  - Dr. Kowalski: [phone]  - Dr. Moye: [phone]  - Dr. Stewart:  [phone]  In the event of inclement weather, please call our main line at [phone] for an update on the status of any delays or closures.  Dermatology Medication Tips: Please keep the boxes that topical medications come in in order to help keep track of the instructions about where and how to use these. Pharmacies typically print the medication instructions only on the boxes and not directly on the medication tubes.   If your medication is too expensive, please contact our office at [phone] option 4 or send us a message through MyChart.   We are unable to tell what your co-pay for medications will be in advance as this is different depending on your insurance coverage. However, we may be able to find a substitute medication at lower cost or fill out paperwork to get insurance to cover a needed medication.   If a prior authorization is required to get your medication covered by your insurance company, please allow us 1-2 business days to complete this process.  Drug prices often vary depending on where the prescription is filled and some pharmacies may offer cheaper prices.  The website www.goodrx.com contains coupons for medications through different pharmacies. The prices here do not account for what the cost may be with help from insurance (it may be cheaper with your insurance), but the website can give you the price if you did not use any insurance.  - You can print the associated coupon and take it with your prescription to the pharmacy.  - You may also stop by our office during regular business hours and pick up a GoodRx coupon card.  - If you need your prescription sent electronically to a different pharmacy, notify our office through [place] MyChart or by phone at [phone] option 4.     Si Usted Necesita Algo Despus de Su Visita  Tambin puede enviarnos un mensaje a travs de MyChart. Por lo general respondemos a los mensajes de MyChart en el transcurso de 1 a 2  das hbiles.  Para renovar recetas, por favor pida a su farmacia que se ponga en contacto con nuestra oficina. Nuestro nmero de fax es el [phone].  Si tiene un asunto urgente cuando la clnica est cerrada y que no puede esperar hasta el siguiente da hbil, puede llamar/localizar a su doctor(a) al nmero que aparece a continuacin.   Por favor, tenga en cuenta que aunque hacemos todo lo posible para estar disponibles para asuntos urgentes fuera del horario de oficina, no estamos disponibles las 24 horas del da, los 7 das de la semana.   Si tiene un problema urgente y no puede comunicarse con nosotros, puede optar por buscar atencin mdica  en el consultorio de su doctor(a), en una clnica privada, en un centro de atencin urgente o en una sala de emergencias.  Si tiene una emergencia mdica, por favor   llame inmediatamente al 911 o vaya a la sala de emergencias.  Nmeros de bper  - Dr. Gwen Pounds: [phone]  - Dra. Moye: [phone]  - Dra. Roseanne Reno: [phone]  En caso de inclemencias del [place], por favor llame a Lacy Duverney principal al [phone] para una actualizacin sobre el [place] de cualquier retraso o cierre.  Consejos para la medicacin en dermatologa: Por favor, guarde las cajas en las que vienen los medicamentos de uso tpico para ayudarle a seguir las instrucciones sobre dnde y cmo usarlos. Las farmacias generalmente imprimen las instrucciones del medicamento slo en las cajas y no directamente en los tubos del [place].   Si su medicamento es muy caro, por favor, pngase en contacto con Rolm Gala llamando al [phone] y presione la opcin 4 o envenos un mensaje a travs de Clinical cytogeneticist.   No podemos decirle cul ser su copago por los medicamentos por adelantado ya que esto es diferente dependiendo de la cobertura de su seguro. Sin embargo, es posible que podamos encontrar un medicamento sustituto a Audiological scientist un formulario para que el  seguro cubra el medicamento que se considera necesario.   Si se requiere una autorizacin previa para que su compaa de seguros [place] su medicamento, por favor permtanos de 1 a 2 das hbiles para completar [address]  Los precios de los medicamentos varan con frecuencia dependiendo del Environmental consultant de dnde se surte la receta y alguna farmacias pueden ofrecer precios ms baratos.  El sitio web www.goodrx.com tiene cupones para medicamentos de Health and safety inspector. Los precios aqu no tienen en cuenta lo que podra costar con la ayuda del seguro (puede ser ms barato con su seguro), pero el sitio web puede darle el precio si no utiliz Tourist information centre manager.  - Puede imprimir el cupn correspondiente y llevarlo con su receta a la farmacia.  - Tambin puede pasar por nuestra oficina durante el horario de atencin regular y Education officer, museum una tarjeta de cupones de GoodRx.  - Si necesita que su receta se enve electrnicamente a una farmacia diferente, informe a nuestra oficina a travs de MyChart de [place] o por telfono llamando al [phone] y presione la opcin 4.

## 2022-07-30 NOTE — Progress Notes (Signed)
Follow Up Visit   Subjective  Doris Roberson is a 32 y.o. female who presents for the following: Itchy rash that started 2 weeks ago on left arm, then rash spread to arms, legs, and abdomen about 3 days later. She is still getting new spots. She was put on a Prednisone taper x 5 days, but didn't help any. She is not on any new medicines, no recent sickness, no new soap or laundry detergent. She does have a dog that goes in and out. No one else in house has this. She is using Calamine lotion. She has been outside working in garden.  She also has spots on face that have since cleared up.  The following portions of the chart were reviewed this encounter and updated as appropriate: medications, allergies, medical history  Review of Systems:  No other skin or systemic complaints except as noted in HPI or Assessment and Plan.  Objective  Well appearing patient in no apparent distress; mood and affect are within normal limits.  A focused examination was performed of the following areas: Face, arms, legs, trunk  Relevant exam findings are noted in the Assessment and Plan.    Assessment & Plan   Medication management  Allergic contact dermatitis due to plants, except food   POST-INFLAMMATORY HYPERPIGMENTATION (PIH) Secondary to recent Acne flare Exam: angular hyperpigmented patches at face   This is a benign condition that comes from having previous inflammation in the skin and will fade with time over months to sometimes years. Recommend daily sun protection including sunscreen SPF 30+ to sun-exposed areas. - Recommend treating any itchy or red areas on the skin quickly to prevent new areas of PIH. Treating with prescription medicines such as hydroquinone may help fade dark spots faster.    Treatment Plan:  Recommend daily broad spectrum sunscreen SPF 30+ to sun-exposed areas, reapply every 2 hours as needed. Call for new or changing lesions.  Staying in the shade or wearing long  sleeves, sun glasses (UVA+UVB protection) and wide brim hats (4-inch brim around the entire circumference of the hat) are also recommended for sun protection.   Patient defers treatment for acne today.  Avoid picking    Allergic Contact Dermatitis Exam: Linear pink edematous papules with excoriations on the legs > arms, abdomen.   Treatment Plan: Take prednisone by mouth daily in morning with food starting at 60 mg dosage and gradually decreasing daily dose as directed until gone for a three week taper.  Patient was given written instructions.  Potential side effects reviewed. Dsp #150 0Rf. Risks of prednisone taper discussed including mood irritability, insomnia, weight gain, stomach ulcers, increased risk of infection, increased blood sugar (diabetes), hypertension, osteoporosis with long-term or frequent use, and rare risk of avascular necrosis of the hip.   Start Clobetasol Cream Apply QD/BID to itchy areas rash dsp 60g 0Rf. Avoid face, groin, axilla. Topical steroids (such as triamcinolone, fluocinolone, fluocinonide, mometasone, clobetasol, halobetasol, betamethasone, hydrocortisone) can cause thinning and lightening of the skin if they are used for too long in the same area. Your physician has selected the right strength medicine for your problem and area affected on the body. Please use your medication only as directed by your physician to prevent side effects.   May continue hydroxyzine 25mg  prn itch. Patient has at home. Risk drowsiness.   Return if symptoms worsen or fail to improve.  Wendee Beavers, CMA, am acting as scribe for Willeen Niece, MD .   Documentation: I  have reviewed the above documentation for accuracy and completeness, and I agree with the above.  Willeen Niece, MD

## 2022-08-11 ENCOUNTER — Telehealth (HOSPITAL_COMMUNITY): Payer: Self-pay | Admitting: Physician Assistant

## 2022-08-12 ENCOUNTER — Telehealth (HOSPITAL_COMMUNITY): Payer: Self-pay | Admitting: Physician Assistant

## 2022-08-12 ENCOUNTER — Telehealth (HOSPITAL_COMMUNITY): Payer: Self-pay

## 2022-08-12 ENCOUNTER — Other Ambulatory Visit (HOSPITAL_COMMUNITY): Payer: Self-pay | Admitting: Physician Assistant

## 2022-08-12 DIAGNOSIS — F902 Attention-deficit hyperactivity disorder, combined type: Secondary | ICD-10-CM

## 2022-08-12 MED ORDER — AMPHETAMINE-DEXTROAMPHETAMINE 30 MG PO TABS
30.0000 mg | ORAL_TABLET | Freq: Two times a day (BID) | ORAL | 0 refills | Status: DC
Start: 2022-08-12 — End: 2023-12-31

## 2022-08-12 NOTE — Telephone Encounter (Signed)
Message acknowledged and reviewed.

## 2022-08-12 NOTE — Progress Notes (Signed)
Provider was contacted by Jacky Kindle regarding request for patient's medication to be refilled.  Patient's medication to be e-prescribed to pharmacy of choice.

## 2022-08-12 NOTE — Telephone Encounter (Signed)
Message acknowledged and reviewed. Patient's medication was sent to preferred pharmacy.

## 2022-08-29 ENCOUNTER — Telehealth (INDEPENDENT_AMBULATORY_CARE_PROVIDER_SITE_OTHER): Payer: Medicaid Other | Admitting: Physician Assistant

## 2022-08-29 DIAGNOSIS — F902 Attention-deficit hyperactivity disorder, combined type: Secondary | ICD-10-CM | POA: Diagnosis not present

## 2022-08-29 DIAGNOSIS — F339 Major depressive disorder, recurrent, unspecified: Secondary | ICD-10-CM | POA: Diagnosis not present

## 2022-08-29 MED ORDER — AMPHETAMINE-DEXTROAMPHETAMINE 30 MG PO TABS
30.0000 mg | ORAL_TABLET | Freq: Two times a day (BID) | ORAL | 0 refills | Status: DC
Start: 2022-09-11 — End: 2023-12-31

## 2022-08-29 MED ORDER — SERTRALINE HCL 100 MG PO TABS
100.0000 mg | ORAL_TABLET | Freq: Every day | ORAL | 3 refills | Status: DC
Start: 2022-08-29 — End: 2022-12-01

## 2022-08-29 MED ORDER — QUETIAPINE FUMARATE 100 MG PO TABS
100.0000 mg | ORAL_TABLET | Freq: Every day | ORAL | 3 refills | Status: DC
Start: 2022-08-29 — End: 2022-12-01

## 2022-08-29 MED ORDER — AMPHETAMINE-DEXTROAMPHETAMINE 30 MG PO TABS
30.0000 mg | ORAL_TABLET | Freq: Two times a day (BID) | ORAL | 0 refills | Status: DC
Start: 2022-10-11 — End: 2022-12-02

## 2022-08-29 NOTE — Progress Notes (Unsigned)
BH MD/PA/NP OP Progress Note  Virtual Visit via Video Note  I connected with Doris Roberson on 08/29/22 at  2:30 PM EDT by a video enabled telemedicine application and verified that I am speaking with the correct person using two identifiers.  Location: Patient: Home Provider: Clinic   I discussed the limitations of evaluation and management by telemedicine and the availability of in person appointments. The patient expressed understanding and agreed to proceed.  Follow Up Instructions:  I discussed the assessment and treatment plan with the patient. The patient was provided an opportunity to ask questions and all were answered. The patient agreed with the plan and demonstrated an understanding of the instructions.   The patient was advised to call back or seek an in-person evaluation if the symptoms worsen or if the condition fails to improve as anticipated.  I provided 13 minutes of non-face-to-face time during this encounter.  Meta Hatchet, PA    08/29/2022 2:59 PM Doris Roberson  MRN:  [id number]  Chief Complaint:  No chief complaint on file.  HPI:   Doris Roberson is a 32 year old, Caucasian female with a past psychiatric history significant for attention deficit hyperactivity disorder (combined type) and major depressive disorder who presents to [facility] via virtual video visit for follow-up and medication management.  Patient is being managed on the following psychiatric medications:  Adderall 30 mg 2 times daily Seroquel 100 mg at bedtime Sertraline 100 mg daily  Patient reports no issues or concerns regarding her current medication regimen.  She does report that she experiences snacking in the middle of the night due to her Seroquel use but states that the medication has been effective in managing her mood and sleep.  Provider suggested that the patient try an alternative to Seroquel to limit her snacking in  the middle of the night.  Patient informed provider that she would continue to take Seroquel due to the effectiveness in managing her mood and sleep.  Patient denies experiencing any other adverse side effects and denies the need for dosage adjustments at this time.  Patient denies depression or anxiety and further denies any new stressors at this time.  A GAD-7 screen was performed with the patient scoring a 4.  Patient is alert and oriented x 4, calm, cooperative, and fully engaged in conversation during the encounter.  Patient endorses good mood.  Patient denies suicidal or homicidal ideations.  She further denied auditory or visual hallucinations and does not appear to be responding to internal/external stimuli.  Patient endorses good sleep and receives on average 8 hours of sleep each night.  Patient endorses good appetite and eats on average 3 meals per day.  Patient denies alcohol consumption and illicit drug use.  Patient endorses tobacco use and smokes on average 3 cigarettes/day.  Visit Diagnosis:    ICD-10-CM   1. Attention deficit hyperactivity disorder (ADHD), combined type  F90.2 amphetamine-dextroamphetamine (ADDERALL) 30 MG tablet    amphetamine-dextroamphetamine (ADDERALL) 30 MG tablet    2. Major depressive disorder, recurrent episode with anxious distress (HCC)  F33.9 sertraline (ZOLOFT) 100 MG tablet    QUEtiapine (SEROQUEL) 100 MG tablet      Past Psychiatric History:  Diagnoses: MDD with anxious distress, ADHD Medication trials: Wellbutrin, Remeron, trazodone, Strattera, Suboxone in October 2020, methylphenidate (ineffective) Hospitalizations: denies Suicide attempts: denies Substance use:              -- Denies use of etoh or illicit  drugs including cannabis                         -- Reports past illicit use of heroin, opioid pain pills, amphetamines, benzodiazepines: last used in Oct 2020; denies cravings or urges to use             -- Tobacco: 3-4 cigarettes/day    Past Medical History:  Past Medical History:  Diagnosis Date   Alcohol abuse    s/p inpatient rehab at [facility] in 2011, two DUI's   Bacteria in urine 04/14/2016   Depression    h/o Tylenol pm overdose, subsequent admision to behavioral health   First trimester screening 10/22/2015   Hyperemesis complicating pregnancy, antepartum 03/15/2016   Labor and delivery, indication for care 05/05/2016   Mood disorder (HCC)    Pregnant state, incidental 08/14/2010   Rh negative status during pregnancy in second trimester 01/31/2016   Overview:   Rhogam given on 02/15/16   Smoking (tobacco) complicating pregnancy, third trimester 01/31/2016   Overview:   Smoking 0.5 PPD, sometimes less 6 cigs/day   Growth [place] every 4 weeks while smoking 0.5 PPD - encouraged to quit  NSTs starting at 36 weeks if she is still smoking 0.5 PPD  Pt is smoking < 1/2 pack a day. TJS stated we do not need NST weekly now.    Tobacco use in pregnancy, antepartum 10/05/2015   Overview:   Smoking 0.5 PPD - smoking cessation encouraged   11/06/15: Nicotine patches prescribed     Past Surgical History:  Procedure Laterality Date   ANTERIOR CRUCIATE LIGAMENT REPAIR Left 2008   CESAREAN SECTION N/A 05/07/2016   Procedure: CESAREAN SECTION;  Surgeon: Christeen Douglas, MD;  Location: [facility] ORS;  Service: Obstetrics;  Laterality: N/A;  Female born @ [birth year] Apgars: 9/9 Weight:7lb 11oz    Family Psychiatric History:  Patient denies  Family History:  Family History  Problem Relation Age of Onset   Alcohol abuse Other    Heart disease Father     Social History:  Social History   Socioeconomic History   Marital status: Single    Spouse name: Not on file   Number of children: Not on file   Years of education: Not on file   Highest education level: Not on file  Occupational History   Not on file  Tobacco Use   Smoking status: Every Day    Packs/day: .25    Types: Cigarettes   Smokeless tobacco: Never  Substance and  Sexual Activity   Alcohol use: No   Drug use: Not Currently    Comment: last use October 2020   Sexual activity: Yes  Other Topics Concern   Not on file  Social History Narrative   Not on file   Social Determinants of Health   Financial Resource Strain: Not on file  Food Insecurity: Not on file  Transportation Needs: Not on file  Physical Activity: Not on file  Stress: Not on file  Social Connections: Not on file    Allergies:  Allergies  Allergen Reactions   Other Other (See Comments)    An abx, doesn't remember name. Stomach upset    Nitrofurantoin Rash    Metabolic Disorder Labs: No results found for: "HGBA1C", "MPG" No results found for: "PROLACTIN" No results found for: "CHOL", "TRIG", "HDL", "CHOLHDL", "VLDL", "LDLCALC" Lab Results  Component Value Date   TSH 1.93 03/12/2010    Therapeutic Level Labs: No results  found for: "LITHIUM" No results found for: "VALPROATE" No results found for: "CBMZ"  Current Medications: Current Outpatient Medications  Medication Sig Dispense Refill   amphetamine-dextroamphetamine (ADDERALL) 30 MG tablet Take 1 tablet by mouth 2 (two) times daily. 60 tablet 0   amphetamine-dextroamphetamine (ADDERALL) 30 MG tablet Take 1 tablet by mouth 2 (two) times daily. 60 tablet 0   [START ON 09/11/2022] amphetamine-dextroamphetamine (ADDERALL) 30 MG tablet Take 1 tablet by mouth 2 (two) times daily. 60 tablet 0   [START ON 10/11/2022] amphetamine-dextroamphetamine (ADDERALL) 30 MG tablet Take 1 tablet by mouth 2 (two) times daily. 60 tablet 0   clindamycin (CLEOCIN T) 1 % external solution Apply to face every morning for acne 60 mL 3   clobetasol cream (TEMOVATE) 0.05 % Apply to affected areas rash once to twice daily for itch. Avoid face, groin, underarms. 60 g 0   DIFFERIN 0.3 % gel Apply a Roberson-sized amount to face every night as tolerated for acne. 45 g 3   doxycycline (MONODOX) 100 MG capsule Take 1 capsule (100 mg total) by mouth 2 (two)  times daily. Take with food. 60 capsule 2   ELIDEL 1 % cream Apply to rash on arm twice a day until improved. 60 g 1   hydrOXYzine (ATARAX) 25 MG tablet Take 1 tablet (25 mg total) by mouth 3 (three) times daily as needed. (Patient not taking: Reported on 02/28/2022) 75 tablet 2   ketoconazole (NIZORAL) 2 % cream Apply to left arm twice a day x 2 weeks. 60 g 0   predniSONE (DELTASONE) 5 MG tablet Take as directed every morning for 3 week taper. Patient has written instructions. 150 tablet 0   QUEtiapine (SEROQUEL) 100 MG tablet Take 1 tablet (100 mg total) by mouth at bedtime. 30 tablet 3   sertraline (ZOLOFT) 100 MG tablet Take 1 tablet (100 mg total) by mouth daily. 30 tablet 3   No current facility-administered medications for this visit.     Musculoskeletal: Strength & Muscle Tone: within normal limits Gait & Station: normal Patient leans: N/A  Psychiatric Specialty Exam: Review of Systems  Psychiatric/Behavioral:  Negative for decreased concentration, dysphoric mood, hallucinations, self-injury, sleep disturbance and suicidal ideas. The patient is not nervous/anxious and is not hyperactive.     unknown if currently breastfeeding.There is no height or weight on file to calculate BMI.  General Appearance: Casual  Eye Contact:  Good  Speech:  Clear and Coherent and Normal Rate  Volume:  Normal  Mood:  Euthymic  Affect:  Appropriate  Thought Process:  Coherent, Goal Directed, and Descriptions of Associations: Intact  Orientation:  Full (Time, Place, and Person)  Thought Content: WDL   Suicidal Thoughts:  No  Homicidal Thoughts:  No  Memory:  Immediate;   Good Recent;   Good Remote;   Good  Judgement:  Good  Insight:  Good  Psychomotor Activity:  Normal  Concentration:  Concentration: Good and Attention Span: Good  Recall:  Good  Fund of Knowledge: Good  Language: Good  Akathisia:  No  Handed:  Right  AIMS (if indicated): not done  Assets:  Communication Skills Desire  for Improvement Financial Resources/Insurance Housing Vocational/Educational  ADL's:  Intact  Cognition: WNL  Sleep:  Good   Screenings: GAD-7    Flowsheet Row Video Visit from 08/29/2022 in [facility] Video Visit from 05/30/2022 in [facility] Video Visit from 11/29/2021 in [facility] Video Visit from  08/30/2021 in [facility] Video Visit from 05/31/2021 in [facility]  Total GAD-7 Score 4 4 2 1 4       [id number]    Flowsheet Row Video Visit from 08/29/2022 in [facility] Video Visit from 05/30/2022 in [facility] Video Visit from 11/29/2021 in [facility] Video Visit from 08/30/2021 in [facility] Video Visit from 05/31/2021 in [facility]  PHQ-2 Total Score 0 0 0 1 1      Flowsheet Row Video Visit from 08/29/2022 in [facility] Video Visit from 05/30/2022 in [facility] Video Visit from [facility]/29/2021 in [facility]  C-SSRS RISK CATEGORY No Risk No Risk No Risk        Assessment and Plan:   Doris Roberson is a 32 year old, Caucasian female with a past psychiatric history significant for attention deficit hyperactivity disorder (combined type) and major depressive disorder who presents to [facility] via virtual video visit for follow-up and medication management.  Patient presents today encounter stating that she experiences snacking in middle of the night due to her Seroquel use.  Patient denies wanting to substitute her Seroquel out for another medication.  Patient appears stable on her current medication regimen.  Patient denies depression or anxiety and further denies any new stressors at  this time.  Patient medications to be e-prescribed to pharmacy of choice.  Collaboration of Care: Collaboration of Care: Medication Management AEB provider managing patient's psychiatric medications and Psychiatrist AEB patient being followed by mental health provider at this facility  Patient/Guardian was advised Release of Information must be obtained prior to any record release in order to collaborate their care with an outside provider. Patient/Guardian was advised if they have not already done so to contact the registration department to sign all necessary forms in order for [place] to release information regarding their care.   Consent: Patient/Guardian gives verbal consent for treatment and assignment of benefits for services provided during this visit. Patient/Guardian expressed understanding and agreed to proceed.   1. Attention deficit hyperactivity disorder (ADHD), combined type  - amphetamine-dextroamphetamine (ADDERALL) 30 MG tablet; Take 1 tablet by mouth 2 (two) times daily.  Dispense: 60 tablet; Refill: 0 - amphetamine-dextroamphetamine (ADDERALL) 30 MG tablet; Take 1 tablet by mouth 2 (two) times daily.  Dispense: 60 tablet; Refill: 0  2. Major depressive disorder, recurrent episode with anxious distress (HCC)  - sertraline (ZOLOFT) 100 MG tablet; Take 1 tablet (100 mg total) by mouth daily.  Dispense: 30 tablet; Refill: 3 - QUEtiapine (SEROQUEL) 100 MG tablet; Take 1 tablet (100 mg total) by mouth at bedtime.  Dispense: 30 tablet; Refill: 3  Patient to follow-up in 3 months Provider spent a total of 13 minutes with the patient/reviewing patient's chart  Meta Hatchet, PA 08/29/2022, 2:59 PM

## 2022-09-01 ENCOUNTER — Encounter (HOSPITAL_COMMUNITY): Payer: Self-pay | Admitting: Physician Assistant

## 2022-11-07 ENCOUNTER — Telehealth (HOSPITAL_COMMUNITY): Payer: Self-pay

## 2022-11-07 NOTE — Telephone Encounter (Signed)
Medication refill - Call from patient requesting a new Adderall 30 mg order be sent into her [facility] pharmacy, last provided 10/11/22 and patient returns next on 11/28/22. Patient requested this be there for her to pick up when due. Informed patient the Otila Back, [place] was out of the office until Tuesday 11/11/22 but would send the request to him to review upon his return but would also send to covering provider to see if they would send prior to that date.  Patient agreed with plan and will call back on 11/11/22 to follow up if no order has been sent by that time.

## 2022-11-10 ENCOUNTER — Other Ambulatory Visit (HOSPITAL_COMMUNITY): Payer: Self-pay | Admitting: Psychiatry

## 2022-11-10 DIAGNOSIS — F902 Attention-deficit hyperactivity disorder, combined type: Secondary | ICD-10-CM

## 2022-11-10 MED ORDER — AMPHETAMINE-DEXTROAMPHETAMINE 30 MG PO TABS
30.0000 mg | ORAL_TABLET | Freq: Two times a day (BID) | ORAL | 0 refills | Status: DC
Start: 2022-11-10 — End: 2023-12-31

## 2022-11-10 NOTE — Telephone Encounter (Signed)
Medication refilled and sent to preferred pharmacy

## 2022-11-14 NOTE — Telephone Encounter (Signed)
Thank you :)

## 2022-11-28 ENCOUNTER — Telehealth (INDEPENDENT_AMBULATORY_CARE_PROVIDER_SITE_OTHER): Payer: Medicaid Other | Admitting: Physician Assistant

## 2022-11-28 DIAGNOSIS — F339 Major depressive disorder, recurrent, unspecified: Secondary | ICD-10-CM | POA: Diagnosis not present

## 2022-11-28 DIAGNOSIS — F902 Attention-deficit hyperactivity disorder, combined type: Secondary | ICD-10-CM

## 2022-12-01 ENCOUNTER — Encounter (HOSPITAL_COMMUNITY): Payer: Self-pay | Admitting: Physician Assistant

## 2022-12-01 MED ORDER — QUETIAPINE FUMARATE 100 MG PO TABS
100.0000 mg | ORAL_TABLET | Freq: Every day | ORAL | 0 refills | Status: AC
Start: 2022-12-01 — End: ?

## 2022-12-01 MED ORDER — SERTRALINE HCL 100 MG PO TABS: 100.0000 mg | ORAL_TABLET | Freq: Every day | ORAL | 0 refills | Status: AC

## 2022-12-01 NOTE — Progress Notes (Signed)
BH MD/PA/NP OP Progress Note  Virtual Visit via Video Note  I connected with Doris Roberson on 12/01/22 at  2:00 PM EDT by a video enabled telemedicine application and verified that I am speaking with the correct person using two identifiers.  Location: Patient: Home Provider: Clinic   I discussed the limitations of evaluation and management by telemedicine and the availability of in person appointments. The patient expressed understanding and agreed to proceed.  Follow Up Instructions:  I discussed the assessment and treatment plan with the patient. The patient was provided an opportunity to ask questions and all were answered. The patient agreed with the plan and demonstrated an understanding of the instructions.   The patient was advised to call back or seek an in-person evaluation if the symptoms worsen or if the condition fails to improve as anticipated.  I provided 14 minutes of non-face-to-face time during this encounter.  Meta Hatchet, PA    12/01/2022 3:40 PM Doris Roberson  MRN:  [id number]  Chief Complaint:  Chief Complaint  Patient presents with   Follow-up   Medication Refill   HPI:   Doris Roberson is a 32 year old, Caucasian female with a past psychiatric history significant for attention deficit hyperactivity disorder (combined type) and major depressive disorder who presents to [facility] via virtual video visit for follow-up and medication management.  Patient is being managed on the following psychiatric medications:  Adderall 30 mg 2 times daily Seroquel 100 mg at bedtime Sertraline 100 mg daily  Patient reports that she is currently struggling due to having to leave her mother's place.  Patient reports that her mother is taking psychiatric medications and recently stopped taking her medications.  As a result of her mother not taking her medications, patient reports that her mother was too difficult  to deal with while living at home with her.  Patient states that she was forced to move from the home to [place] where she currently resides.  During the incident involving her mother, patient reports that her mother/the rest of her Adderall prescription down the toilet.  In addition to having to move to [place], patient reports that her vehicle has been acting up.  Patient endorses being emotionally impacted by the recent changes she has undergone.  Patient rates her anxiety a 7 out of 10 and denies any other stressors at this time.  A PHQ-9 screen was performed with the patient scoring an 11.  A GAD-7 screen was also performed with the patient scoring an 11.  Patient is alert and oriented x 4, calm, cooperative, and fully engaged in conversation during the encounter.  Patient endorses okay mood.  Patient denies suicidal or homicidal ideations.  She further denies auditory or visual hallucinations and does not appear to be responding to internal/external stimuli.  Patient endorses good sleep and receives on average 7 hours of sleep per night.  Patient reports that she has been sleeping more than usual.  Patient endorses good appetite and eats on average 3 meals per day.  Patient denies alcohol consumption or illicit drug use.  Patient denies tobacco use but does engage in vaping.  Visit Diagnosis:    ICD-10-CM   1. Attention deficit hyperactivity disorder (ADHD), combined type  F90.2     2. Major depressive disorder, recurrent episode with anxious distress (HCC)  F33.9 QUEtiapine (SEROQUEL) 100 MG tablet    sertraline (ZOLOFT) 100 MG tablet      Past Psychiatric History:  Diagnoses: MDD with anxious distress, ADHD Medication trials: Wellbutrin, Remeron, trazodone, Strattera, Suboxone in October 2020, methylphenidate (ineffective) Hospitalizations: denies Suicide attempts: denies Substance use:              -- Denies use of etoh or illicit drugs including cannabis                          -- Reports past illicit use of heroin, opioid pain pills, amphetamines, benzodiazepines: last used in Oct 2020; denies cravings or urges to use             -- Tobacco: 3-4 cigarettes/day   Past Medical History:  Past Medical History:  Diagnosis Date   Alcohol abuse    s/p inpatient rehab at [facility] in 2011, two DUI's   Bacteria in urine 04/14/2016   Depression    h/o Tylenol pm overdose, subsequent admision to behavioral health   First trimester screening 10/22/2015   Hyperemesis complicating pregnancy, antepartum 03/15/2016   Labor and delivery, indication for care 05/05/2016   Mood disorder (HCC)    Pregnant state, incidental 08/14/2010   Rh negative status during pregnancy in second trimester 01/31/2016   Overview:   Rhogam given on 02/15/16   Smoking (tobacco) complicating pregnancy, third trimester 01/31/2016   Overview:   Smoking 0.5 PPD, sometimes less 6 cigs/day   Growth [place] every 4 weeks while smoking 0.5 PPD - encouraged to quit  NSTs starting at 36 weeks if she is still smoking 0.5 PPD  Pt is smoking < 1/2 pack a day. TJS stated we do not need NST weekly now.    Tobacco use in pregnancy, antepartum 10/05/2015   Overview:   Smoking 0.5 PPD - smoking cessation encouraged   11/06/15: Nicotine patches prescribed     Past Surgical History:  Procedure Laterality Date   ANTERIOR CRUCIATE LIGAMENT REPAIR Left 2008   CESAREAN SECTION N/A 05/07/2016   Procedure: CESAREAN SECTION;  Surgeon: Christeen Douglas, MD;  Location: [facility] ORS;  Service: Obstetrics;  Laterality: N/A;  Female born @ 16 Apgars: 9/9 Weight:7lb 11oz    Family Psychiatric History:  Patient denies  Family History:  Family History  Problem Relation Age of Onset   Alcohol abuse Other    Heart disease Father     Social History:  Social History   Socioeconomic History   Marital status: Single    Spouse name: Not on file   Number of children: Not on file   Years of education: Not on file   Highest  education level: Not on file  Occupational History   Not on file  Tobacco Use   Smoking status: Every Day    Current packs/day: 0.25    Types: Cigarettes   Smokeless tobacco: Never  Substance and Sexual Activity   Alcohol use: No   Drug use: Not Currently    Comment: last use October 2020   Sexual activity: Yes  Other Topics Concern   Not on file  Social History Narrative   Not on file   Social Determinants of Health   Financial Resource Strain: Not on file  Food Insecurity: Not on file  Transportation Needs: Not on file  Physical Activity: Not on file  Stress: Not on file  Social Connections: Unknown (07/29/2021)   [facility]   Social Network    Social Network: Not on file    Allergies:  Allergies  Allergen Reactions  Other Other (See Comments)    An abx, doesn't remember name. Stomach upset    Nitrofurantoin Rash    Metabolic Disorder Labs: No results found for: "HGBA1C", "MPG" No results found for: "PROLACTIN" No results found for: "CHOL", "TRIG", "HDL", "CHOLHDL", "VLDL", "LDLCALC" Lab Results  Component Value Date   TSH 1.93 03/12/2010    Therapeutic Level Labs: No results found for: "LITHIUM" No results found for: "VALPROATE" No results found for: "CBMZ"  Current Medications: Current Outpatient Medications  Medication Sig Dispense Refill   amphetamine-dextroamphetamine (ADDERALL) 30 MG tablet Take 1 tablet by mouth 2 (two) times daily. 60 tablet 0   amphetamine-dextroamphetamine (ADDERALL) 30 MG tablet Take 1 tablet by mouth 2 (two) times daily. 60 tablet 0   amphetamine-dextroamphetamine (ADDERALL) 30 MG tablet Take 1 tablet by mouth 2 (two) times daily. 60 tablet 0   amphetamine-dextroamphetamine (ADDERALL) 30 MG tablet Take 1 tablet by mouth 2 (two) times daily. 60 tablet 0   clindamycin (CLEOCIN T) 1 % external solution Apply to face every morning for acne 60 mL 3   clobetasol cream (TEMOVATE) 0.05 % Apply to  affected areas rash once to twice daily for itch. Avoid face, groin, underarms. 60 g 0   DIFFERIN 0.3 % gel Apply a Roberson-sized amount to face every night as tolerated for acne. 45 g 3   doxycycline (MONODOX) 100 MG capsule Take 1 capsule (100 mg total) by mouth 2 (two) times daily. Take with food. 60 capsule 2   ELIDEL 1 % cream Apply to rash on arm twice a day until improved. 60 g 1   hydrOXYzine (ATARAX) 25 MG tablet Take 1 tablet (25 mg total) by mouth 3 (three) times daily as needed. (Patient not taking: Reported on 02/28/2022) 75 tablet 2   ketoconazole (NIZORAL) 2 % cream Apply to left arm twice a day x 2 weeks. 60 g 0   predniSONE (DELTASONE) 5 MG tablet Take as directed every morning for 3 week taper. Patient has written instructions. 150 tablet 0   QUEtiapine (SEROQUEL) 100 MG tablet Take 1 tablet (100 mg total) by mouth at bedtime. 90 tablet 0   sertraline (ZOLOFT) 100 MG tablet Take 1 tablet (100 mg total) by mouth daily. 90 tablet 0   No current facility-administered medications for this visit.     Musculoskeletal: Strength & Muscle Tone: within normal limits Gait & Station: normal Patient leans: N/A  Psychiatric Specialty Exam: Review of Systems  Psychiatric/Behavioral:  Negative for decreased concentration, dysphoric mood, hallucinations, self-injury, sleep disturbance and suicidal ideas. The patient is not nervous/anxious and is not hyperactive.     unknown if currently breastfeeding.There is no height or weight on file to calculate BMI.  General Appearance: Casual  Eye Contact:  Good  Speech:  Clear and Coherent and Normal Rate  Volume:  Normal  Mood:  Euthymic  Affect:  Appropriate  Thought Process:  Coherent, Goal Directed, and Descriptions of Associations: Intact  Orientation:  Full (Time, Place, and Person)  Thought Content: WDL   Suicidal Thoughts:  No  Homicidal Thoughts:  No  Memory:  Immediate;   Good Recent;   Good Remote;   Good  Judgement:  Good   Insight:  Good  Psychomotor Activity:  Normal  Concentration:  Concentration: Good and Attention Span: Good  Recall:  Good  Fund of Knowledge: Good  Language: Good  Akathisia:  No  Handed:  Right  AIMS (if indicated): not done  Assets:  Communication Skills Desire  for Improvement Financial Resources/Insurance Housing Vocational/Educational  ADL's:  Intact  Cognition: WNL  Sleep:  Good   Screenings: GAD-7    Flowsheet Row Video Visit from [facility]28/20[facility] in [facility] Video Visit from 08/29/2022 in [facility] Video Visit from 05/30/2022 in [facility] Video Visit from 11/29/2021 in [facility] Video Visit from 08/30/2021 in [facility]  Total GAD-7 Score 11 4 4 2 1       PHQ2-9    Flowsheet Row Video Visit from 11/28/2022 in [facility] Video Visit from 08/29/2022 in [facility] Video Visit from 05/30/2022 in [facility] Video Visit from 11/29/2021 in [facility] Video Visit from 08/30/2021 in [facility]  PHQ-2 Total Score 3 0 0 0 1  PHQ-9 Total Score 11 -- -- -- --      Flowsheet Row Video Visit from 11/28/2022 in [facility] Video Visit from 08/29/2022 in [facility] Video Visit from 05/30/2022 in [facility]  C-SSRS RISK CATEGORY No Risk No Risk No Risk        Assessment and Plan:   Garry Sackett. Gookin is a 32 year old, Caucasian female with a past psychiatric history significant for attention deficit hyperactivity disorder (combined type) and major depressive disorder who presents to [facility] via virtual video visit for follow-up and medication management.  Patient presents  today encounter stating that she recently had to move from her mother's place to [facility] due to her mother acting erratically.  She reports that her mother recently stopped taking her medications making it hard for her to deal with.  She reports that she needs a refill on her Adderall prescription because her mother flushed her pills down the toilet during her erratic behavior.  Per chart review, patient was last prescribed Adderall on the 12th of this month.  Provider informed patient that the pharmacy would need to be contacted to refill her prescription since she should not be out by now.  Provider to reach out to patient's pharmacy to talk with pharmacy about patient's dilemma.  Due to patient moving to [place], she states that she feels like she is starting over.  Patient endorses anxiety due to the many changes she has made recently.  Provider informed patient that due to living outside of [facility], she would have to be discharged from this facility and seek psychiatric services in [place].  Patient vocalized understanding.  Patient to be provided a 70-month supply of her current medication regimen prior to the conclusion of the encounter.  Collaboration of Care: Collaboration of Care: Medication Management AEB provider managing patient's psychiatric medications and Psychiatrist AEB patient being followed by mental health provider at this facility  Patient/Guardian was advised Release of Information must be obtained prior to any record release in order to collaborate their care with an outside provider. Patient/Guardian was advised if they have not already done so to contact the registration department to sign all necessary forms in order for [place] to release information regarding their care.   Consent: Patient/Guardian gives verbal consent for treatment and assignment of benefits for services provided during this visit. Patient/Guardian expressed understanding and agreed to proceed.   1.  Major depressive disorder, recurrent episode with anxious distress (HCC)  - QUEtiapine (SEROQUEL) 100 MG tablet; Take  1 tablet (100 mg total) by mouth at bedtime.  Dispense: 90 tablet; Refill: 0 - sertraline (ZOLOFT) 100 MG tablet; Take 1 tablet (100 mg total) by mouth daily.  Dispense: 90 tablet; Refill: 0  2. Attention deficit hyperactivity disorder (ADHD), combined type  Patient to be discharged from the facility due to living outside of [facility] Provider spent a total of 14 minutes with the patient/reviewing patient's chart  Meta Hatchet, PA 12/01/2022, 3:40 PM

## 2022-12-02 ENCOUNTER — Other Ambulatory Visit (HOSPITAL_COMMUNITY): Payer: Self-pay | Admitting: Physician Assistant

## 2022-12-02 ENCOUNTER — Telehealth (HOSPITAL_COMMUNITY): Payer: Self-pay | Admitting: *Deleted

## 2022-12-02 DIAGNOSIS — F902 Attention-deficit hyperactivity disorder, combined type: Secondary | ICD-10-CM

## 2022-12-02 MED ORDER — AMPHETAMINE-DEXTROAMPHETAMINE 30 MG PO TABS
30.0000 mg | ORAL_TABLET | Freq: Two times a day (BID) | ORAL | 0 refills | Status: AC
Start: 2022-12-02 — End: 2023-01-01

## 2022-12-02 NOTE — Progress Notes (Signed)
Provider spoke to patient last Friday regarding patient's request for Adderall prescription to be filled.  Patient's previous prescription was filled on 11/10/2022.  Per patient, patient's mother had a mental breakdown and flushed her remaining Adderall prescription down the toilet.  Provider informed patient that her medications will be filled once the pharmacy has been contacted about patient's situation.  Provider contacted pharmacy and discussed patient's situation regarding her Adderall prescription.  Pharmacy okay to fill patient's prescription.  Patient's prescription to be e-prescribed to pharmacy of choice.

## 2022-12-02 NOTE — Telephone Encounter (Signed)
Patient called asking that her Adderall refill be sent in.

## 2022-12-02 NOTE — Telephone Encounter (Signed)
Message acknowledged and reviewed.  Provider reach out to pharmacy to discuss patient's situation regarding her Adderall prescription.  Pharmacy okay to fill prescription.  Patient's medication to be e-prescribed to pharmacy of choice.

## 2022-12-30 ENCOUNTER — Telehealth (HOSPITAL_COMMUNITY): Payer: Self-pay | Admitting: Physician Assistant

## 2022-12-31 ENCOUNTER — Other Ambulatory Visit (HOSPITAL_COMMUNITY): Payer: Self-pay | Admitting: Physician Assistant

## 2022-12-31 DIAGNOSIS — F902 Attention-deficit hyperactivity disorder, combined type: Secondary | ICD-10-CM

## 2022-12-31 MED ORDER — AMPHETAMINE-DEXTROAMPHETAMINE 30 MG PO TABS
30.0000 mg | ORAL_TABLET | Freq: Two times a day (BID) | ORAL | 0 refills | Status: DC
Start: 2023-01-01 — End: 2023-02-08

## 2022-12-31 NOTE — Telephone Encounter (Signed)
Message acknowledged and reviewed. Patient's medication to be e-prescribed to pharmacy of choice.

## 2022-12-31 NOTE — Progress Notes (Signed)
Provider was contacted by Doris Roberson regarding patient's medication refill request.  Patient's medication to be e-prescribed to pharmacy of choice.

## 2023-02-02 ENCOUNTER — Telehealth (HOSPITAL_COMMUNITY): Payer: Self-pay | Admitting: Physician Assistant

## 2023-02-08 ENCOUNTER — Other Ambulatory Visit (HOSPITAL_COMMUNITY): Payer: Self-pay | Admitting: Physician Assistant

## 2023-02-08 DIAGNOSIS — F902 Attention-deficit hyperactivity disorder, combined type: Secondary | ICD-10-CM

## 2023-02-08 MED ORDER — AMPHETAMINE-DEXTROAMPHETAMINE 30 MG PO TABS: 30.0000 mg | ORAL_TABLET | Freq: Two times a day (BID) | ORAL | 0 refills | Status: AC

## 2023-02-08 NOTE — Progress Notes (Signed)
Provider was contacted by Doris Roberson in regards to patient request for medication refill. Patient understands that this will be the last refill since she is not set up with this facility anymore. Recommendation for patient to establish psychiatric care at our sister facility in [place], [place]. Patient's medication to be e-prescribed to pharmacy of choice.

## 2023-02-08 NOTE — Telephone Encounter (Signed)
Message acknowledged and reviewed.

## 2023-02-23 ENCOUNTER — Telehealth (HOSPITAL_COMMUNITY): Payer: Self-pay | Admitting: Licensed Clinical Social Worker

## 2023-02-23 NOTE — Telephone Encounter (Signed)
The therapist returns Doris Roberson's call confirming her identity via two identifiers.  She says that she was recently discharged from [facility] and is interested in attending the substance abuse intensive outpatient treatment program.  Unfortunately, at this time she has healthy [facility] which does not cover substance abuse intensive outpatient treatment for persons over the age of 27.  Thus, the therapist provides her with the toll-free number for [facility] so that she can contact them and change from healthy [employer] to their coverage which would allow her to be eligible to attend SA IOP.  The therapist also gives her his direct contact number.  Doris Roberson is going to call [employer] and once she has changed to their coverage, she will call this therapist back in order to schedule an assessment regarding attending IOP.  Doris Blazer, MA, LCSW, [facility], LCAS 02/23/2023

## 2023-03-30 NOTE — Telephone Encounter (Signed)
sent 

## 2023-04-15 ENCOUNTER — Other Ambulatory Visit: Payer: Self-pay | Admitting: Dermatology

## 2023-04-27 ENCOUNTER — Ambulatory Visit: Payer: Medicaid Other | Admitting: Internal Medicine

## 2023-04-27 DIAGNOSIS — O26843 Uterine size-date discrepancy, third trimester: Secondary | ICD-10-CM | POA: Insufficient documentation

## 2023-04-28 ENCOUNTER — Encounter: Payer: Self-pay | Admitting: Family Medicine

## 2023-04-28 ENCOUNTER — Ambulatory Visit: Payer: Medicaid Other | Admitting: Family Medicine

## 2023-04-28 VITALS — BP 129/86 | HR 87 | Temp 98.0°F | Resp 14 | Ht 67.0 in | Wt 133.8 lb

## 2023-04-28 DIAGNOSIS — F339 Major depressive disorder, recurrent, unspecified: Secondary | ICD-10-CM | POA: Diagnosis not present

## 2023-04-28 DIAGNOSIS — F902 Attention-deficit hyperactivity disorder, combined type: Secondary | ICD-10-CM

## 2023-04-28 DIAGNOSIS — J411 Mucopurulent chronic bronchitis: Secondary | ICD-10-CM | POA: Insufficient documentation

## 2023-04-28 DIAGNOSIS — F1191 Opioid use, unspecified, in remission: Secondary | ICD-10-CM

## 2023-04-28 NOTE — Assessment & Plan Note (Signed)
Followed by psychiatry and doing well PHQ-9 3

## 2023-04-28 NOTE — Patient Instructions (Signed)
We are happy you are here and to be able to care for you! Our number is [phone] or reach out to [place] through MyChart with any questions, concerns or needs. --Fermin Schwab., CMA and Dr. Cheri Kearns

## 2023-04-28 NOTE — Progress Notes (Signed)
Established Patient Office Visit  Subjective   Patient ID: Doris Roberson, female    DOB: [birth year]  Age: 33 y.o. MRN: [id number]  Chief Complaint  Patient presents with   Medical Management of Chronic Issues   Establish Care    HPI Doris Roberson is doing really well.  She went to [facility] in [place] and got off heroin.  She is no longer on Suboxone.  She stays with her parents because she does better if people are around.  She is not working NA or AA but knows the 12-step program.  No alcohol or drugs.  She is getting hair follicle testing through [facility] at her parents insistence.  She likes it because it keeps her accountable.  She has been followed by psychiatry.  She takes Adderall, Seroquel 200 and Zoloft 200.  She takes hydroxyzine as needed PHQ-9-3 GAD-7-9 LMP 1 week ago normal.  No problems with her cycle.  She is not on contraception.  She is due for a Pap smear and asked her to schedule for that She is smoking about 4 cigarettes a day and feels better.  No wheezing or coughing. She got her flu vaccine at work.  Still working for [employer].       ROS    Objective:     BP 129/86 (BP Location: Right Arm, Patient Position: Sitting, Cuff Size: Normal)   Pulse 87   Temp 98 F (36.7 C) (Oral)   Resp 14   Ht 5\' 7"  (1.702 m) Comment: per chart  Wt 133 lb 12.8 oz (60.7 kg)   LMP 04/20/2023 (Approximate)   SpO2 98%   BMI 20.96 kg/m    Physical Exam Vitals and nursing note reviewed.  Constitutional:      Appearance: Normal appearance.  HENT:     Head: Normocephalic and atraumatic.  Eyes:     Conjunctiva/sclera: Conjunctivae normal.  Cardiovascular:     Rate and Rhythm: Normal rate and regular rhythm.  Pulmonary:     Effort: Pulmonary effort is normal.     Breath sounds: Normal breath sounds.  Musculoskeletal:     Right lower leg: No edema.     Left lower leg: No edema.  Skin:    General: Skin is warm and dry.  Neurological:     Mental Status: She is alert  and oriented to person, place, and time.  Psychiatric:        Mood and Affect: Mood normal.        Behavior: Behavior normal.        Thought Content: Thought content normal.        Judgment: Judgment normal.          No results found for any visits on 04/28/23.    The ASCVD Risk score (Arnett DK, et al., 2019) failed to calculate for the following reasons:   The 2019 ASCVD risk score is only valid for ages 9 to 74    Assessment & Plan:  Attention deficit hyperactivity disorder (ADHD), combined type Assessment & Plan: Followed by psychiatry and doing well   Major depressive disorder, recurrent episode with anxious distress (HCC) Assessment & Plan: Followed by psychiatry and doing well PHQ-9 3   Opioid use disorder in remission Assessment & Plan: Recent hospitalization at [facility].  Off Suboxone and doing well.   Bronchitis, mucopurulent recurrent (HCC) Assessment & Plan: Has multiple episodes of bronchitis yearly and smokes.  Checking her spirometry.  FEV1/FVC 0.53 Normal lung function  Return in about 6 months (around 10/26/2023).    Alease Medina, MD

## 2023-04-28 NOTE — Assessment & Plan Note (Signed)
Recent hospitalization at [facility] [place].  Off Suboxone and doing well.

## 2023-04-28 NOTE — Assessment & Plan Note (Signed)
Followed by psychiatry and doing well

## 2023-04-28 NOTE — Assessment & Plan Note (Addendum)
Has multiple episodes of bronchitis yearly and smokes.  Checking her spirometry.  FEV1/FVC 0.53 Normal lung function

## 2023-05-05 ENCOUNTER — Ambulatory Visit: Payer: Medicaid Other | Admitting: Internal Medicine

## 2023-05-07 ENCOUNTER — Telehealth: Payer: Self-pay

## 2023-05-07 ENCOUNTER — Other Ambulatory Visit: Payer: Self-pay | Admitting: Dermatology

## 2023-05-07 DIAGNOSIS — L739 Follicular disorder, unspecified: Secondary | ICD-10-CM

## 2023-05-07 NOTE — Telephone Encounter (Signed)
 Medications are not active to refill. Please advise.

## 2023-05-07 NOTE — Telephone Encounter (Signed)
 Copied from CRM [id number]. Topic: Clinical - Prescription Issue >> May 07, 2023  3:12 PM Deleta HERO wrote: Reason for CRM: PT is wanting a refill sent for doxycycline , a ventolin inhaler, and Flexeril. She states she would like this sent to a new pharmacy: [facility] - [address] [place], [place] [address].

## 2023-05-08 NOTE — Telephone Encounter (Signed)
 Spoke with patient.  She really wanted hydroxyzine  refilled.  Advised she will have to get this from her psychiatrist.  She has an appointment scheduled for Thursday.  She is out of ventolin but denies being SOB.  She wants flexeril for her neck.  Advised she would have to be seen to get this.

## 2023-05-14 ENCOUNTER — Ambulatory Visit: Payer: Medicaid Other | Admitting: Family Medicine

## 2023-06-11 ENCOUNTER — Telehealth (INDEPENDENT_AMBULATORY_CARE_PROVIDER_SITE_OTHER): Admitting: Family Medicine

## 2023-06-11 DIAGNOSIS — J019 Acute sinusitis, unspecified: Secondary | ICD-10-CM | POA: Insufficient documentation

## 2023-06-11 DIAGNOSIS — J0100 Acute maxillary sinusitis, unspecified: Secondary | ICD-10-CM

## 2023-06-11 MED ORDER — PREDNISONE 5 MG (21) PO TBPK: ORAL_TABLET | ORAL | 0 refills | Status: DC

## 2023-06-11 MED ORDER — DOXYCYCLINE HYCLATE 100 MG PO CAPS: 100.0000 mg | ORAL_CAPSULE | Freq: Two times a day (BID) | ORAL | 0 refills | Status: AC

## 2023-06-11 NOTE — Progress Notes (Signed)
 Virtual Visit via Video Note  I connected with Doris Roberson on 06/11/23 at 10:50 AM EDT by a video enabled telemedicine application and verified that I am speaking with the correct person using two identifiers.  Location: Patient: Doris Roberson Provider: Dr. Cheri Kearns   I discussed the limitations of evaluation and management by telemedicine and the availability of in person appointments. The patient expressed understanding and agreed to proceed.  History of Present Illness:  She has had a couple of weeks of allergies with clear rhinorrhea, sore throat, headache, congestion and cough.  She is not short of breath with ambulation and she is not wheezing.  She is now blowing yellow mucus from her nose.  She denies being febrile.  She has been taking Sudafed, Claritin and Mucinex    Observations/Objective:   Assessment and Plan:  Sterapred 5 mg 6-day Dosepak, doxycycline 100 mg twice daily.  Switch to Zyrtec it tends to be stronger than Claritin for nasal symptoms.  Get over-the-counter Flonase and do 2 sprays each nostril daily when you finish your steroids.   Follow Up Instructions: If you should develop wheezing or shortness of breath with ambulation go to the UC or go to the ED.    I discussed the assessment and treatment plan with the patient. The patient was provided an opportunity to ask questions and all were answered. The patient agreed with the plan and demonstrated an understanding of the instructions.   The patient was advised to call back or seek an in-person evaluation if the symptoms worsen or if the condition fails to improve as anticipated.  I provided 8 minutes of non-face-to-face time during this encounter.   Alease Medina, MD

## 2023-07-03 ENCOUNTER — Telehealth: Admitting: Family Medicine

## 2023-07-03 ENCOUNTER — Encounter: Payer: Self-pay | Admitting: Family Medicine

## 2023-07-03 DIAGNOSIS — F1091 Alcohol use, unspecified, in remission: Secondary | ICD-10-CM | POA: Insufficient documentation

## 2023-07-03 DIAGNOSIS — J301 Allergic rhinitis due to pollen: Secondary | ICD-10-CM | POA: Diagnosis not present

## 2023-07-03 DIAGNOSIS — J309 Allergic rhinitis, unspecified: Secondary | ICD-10-CM | POA: Insufficient documentation

## 2023-07-03 MED ORDER — PREDNISONE 10 MG (48) PO TBPK: ORAL_TABLET | Freq: Every day | ORAL | 0 refills | Status: DC

## 2023-07-03 NOTE — Assessment & Plan Note (Signed)
 She is using Claritin and Flonase and is miserable.  She complains of copious amounts of clear rhinorrhea and sneezing.  Denies wheezing and SOB.  She cannot sleep at night because of the coughing from PND.   Prednsione dosepack 10mg , 12 days.  Please let me know if you develop yellow or green thick mucopurulent nasal discharge or PND.  You may develop a secondary sinus infection.

## 2023-07-03 NOTE — Progress Notes (Addendum)
   Established Patient Office Visit  Subjective   Patient ID: Doris Roberson, female    DOB: 1991-01-15  Age: 33 y.o. MRN: [id number]  No chief complaint on file.   HPI Virtual Visit via Video Note  I connected with Doris Roberson on 07/03/23 at  1:30 PM EDT by a video enabled telemedicine application and verified that I am speaking with the correct person using two identifiers.  Location: Patient: Doris Roberson, at work Provider: Dr. Campbell Kray K. Josette Shimabukuro, MD, [address], [place], [place] [address]   I discussed the limitations of evaluation and management by telemedicine and the availability of in person appointments. The patient expressed understanding and agreed to proceed.  History of Present Illness:    Observations/Objective:   Assessment and Plan:   Follow Up Instructions:    I discussed the assessment and treatment plan with the patient. The patient was provided an opportunity to ask questions and all were answered. The patient agreed with the plan and demonstrated an understanding of the instructions.   The patient was advised to call back or seek an in-person evaluation if the symptoms worsen or if the condition fails to improve as anticipated.  I provided 8 minutes of non-face-to-face time during this encounter.   Donette Furlong, MD  Delightful 33 yo with nicotine  addiction, depression, AUD in remission, opioid use D/O in remission, ADD, allergic rhinitis. She is complaining of copious clear rhinorrhea.  She is using Flonase  OTC and she is miserable. She denies any wheezing or SOB with ambulation    ROS    Objective:     There were no vitals taken for this visit.   Physical Exam       No results found for any visits on 07/03/23.    The ASCVD Risk score (Arnett DK, et al., 2019) failed to calculate for the following reasons:   The 2019 ASCVD risk score is only valid for ages 66 to 25    Assessment & Plan:  Seasonal allergic rhinitis  due to pollen Assessment & Plan: She is using Claritin and Flonase  and is miserable.  She complains of copious amounts of clear rhinorrhea and sneezing.  Denies wheezing and SOB.  She cannot sleep at night because of the coughing from PND.   Prednsione dosepack 10mg , 12 days.  Please let me know if you develop yellow or green thick mucopurulent nasal discharge or PND.  You may develop a secondary sinus infection.     Orders: -     predniSONE ; Take by mouth daily. 12-day taper pack, use as directed for taper  Dispense: 1 tablet; Refill: 0     Return if symptoms worsen or fail to improve.    Natausha Jungwirth K Kin Galbraith, MD

## 2023-07-29 ENCOUNTER — Telehealth: Payer: Self-pay | Admitting: Family Medicine

## 2023-07-29 ENCOUNTER — Telehealth (INDEPENDENT_AMBULATORY_CARE_PROVIDER_SITE_OTHER): Admitting: Family Medicine

## 2023-07-29 ENCOUNTER — Other Ambulatory Visit: Payer: Self-pay | Admitting: Family Medicine

## 2023-07-29 DIAGNOSIS — J302 Other seasonal allergic rhinitis: Secondary | ICD-10-CM

## 2023-07-29 MED ORDER — PREDNISONE 10 MG (48) PO TBPK: ORAL_TABLET | Freq: Every day | ORAL | 0 refills | Status: DC

## 2023-07-29 NOTE — Telephone Encounter (Signed)
Prescription sent to corrected pharmacy.

## 2023-07-29 NOTE — Progress Notes (Addendum)
   Established Patient Office Visit  Subjective   Patient ID: SHAUGHNESSY GETHERS, female    DOB: 27-Jul-1990  Age: 33 y.o. MRN: [id number]   Virtual Visit via Video Note  I connected with Lord Rod on 07/29/23 at 11:10 AM EDT by a video enabled telemedicine application and verified that I am speaking with the correct person using two identifiers.  Location: Patient: Nera Haworth, at work  Provider: Maycol Hoying K. Lakasha Mcfall, MD, at [address] [place], [place] [address]   I discussed the limitations of evaluation and management by telemedicine and the availability of in person appointments. The patient expressed understanding and agreed to proceed.  History of Present Illness: Delightful 33 year old woman with anxiety/depression, polysubstance abuse (AUD in remission ,opioid use in remission) nicotine  addiction uncomplicated, exacerbation of seasonal allergies.   She reports that she is absolutely miserable with head congestion, copious amounts of clear rhinorrhea, nonproductive cough, itchy/scratchy throat, itchy eyes.  She denies shortness of breath, fever or wheezing.  She denies mucopurulent discharge from her cough or from head congestion.  She is using Claritin, Sudafed and Flonase  twice daily.  She says her headache is the worst.    Observations/Objective: Allergic shiners, nasal voice.   Assessment and Plan: Exacerbation of seasonal allergies.  Discussed using Patanol or Pataday for her eye symptoms.  Will give her 12-day 10 mg prednisone  taper.  Should you develop mucopurulent discharge from your sinuses or productive cough of mucopurulent discharge please contact me.   Follow Up Instructions:    I discussed the assessment and treatment plan with the patient. The patient was provided an opportunity to ask questions and all were answered. The patient agreed with the plan and demonstrated an understanding of the instructions.   The patient was advised to call back or seek an  in-person evaluation if the symptoms worsen or if the condition fails to improve as anticipated.  I provided 8 minutes of non-face-to-face time during this encounter.   Donette Furlong, MD       Objective:                     Assessment & Plan:

## 2023-07-29 NOTE — Telephone Encounter (Signed)
 Copied from CRM [id number]. Topic: Clinical - Prescription Issue >> Jul 29, 2023  4:19 PM Phil Braun wrote: Reason for CRM:   Medications predniSONE  (STERAPRED UNI-PAK 48 TAB) 10 MG (48) TBPK tablet  The location that you sent the rx into at is temp closed please resend rx to the following   [id number] - [place], [place] - [address] [[id number]]

## 2023-08-20 ENCOUNTER — Telehealth: Payer: Self-pay | Admitting: Plastic Surgery

## 2023-08-20 ENCOUNTER — Emergency Department (HOSPITAL_BASED_OUTPATIENT_CLINIC_OR_DEPARTMENT_OTHER)
Admission: EM | Admit: 2023-08-20 | Discharge: 2023-08-20 | Disposition: A | Discharge status: Home / Self Care | Attending: Emergency Medicine | Admitting: Emergency Medicine

## 2023-08-20 ENCOUNTER — Encounter (HOSPITAL_BASED_OUTPATIENT_CLINIC_OR_DEPARTMENT_OTHER): Payer: Self-pay

## 2023-08-20 ENCOUNTER — Other Ambulatory Visit: Payer: Self-pay

## 2023-08-20 DIAGNOSIS — S0541XA Penetrating wound of orbit with or without foreign body, right eye, initial encounter: Secondary | ICD-10-CM | POA: Diagnosis not present

## 2023-08-20 DIAGNOSIS — W228XXA Striking against or struck by other objects, initial encounter: Secondary | ICD-10-CM | POA: Insufficient documentation

## 2023-08-20 DIAGNOSIS — Y92096 Garden or yard of other non-institutional residence as the place of occurrence of the external cause: Secondary | ICD-10-CM | POA: Diagnosis not present

## 2023-08-20 DIAGNOSIS — S0190XA Unspecified open wound of unspecified part of head, initial encounter: Secondary | ICD-10-CM

## 2023-08-20 DIAGNOSIS — S0591XA Unspecified injury of right eye and orbit, initial encounter: Secondary | ICD-10-CM | POA: Diagnosis present

## 2023-08-20 NOTE — Discharge Instructions (Signed)
 Call Dr. Orin Birk -who is plastic surgeon affiliated with [facility] health for additional management.

## 2023-08-20 NOTE — ED Notes (Signed)

## 2023-08-20 NOTE — ED Provider Notes (Signed)
 [place] EMERGENCY DEPARTMENT AT [facility] Provider Note   CSN: [id number] Arrival date & time: 08/20/23  1249     History  Chief Complaint  Patient presents with   Foreign Body    In scar tissue    Doris Roberson is a 33 y.o. female.  HPI    33 year old female comes in with chief complaint of foreign body to the eye.  Patient states that months ago, she was working outside in the yard, [employer] when something struck her onto her face, right by her eye.  She had bleeding from the site.  She decided not to seek care at that time, hoping that the wound will clean on its own.  She did have a deep laceration.  The wound did heal, but she feels that something is in the tissue.  Occasionally, there will be some bleeding from the site.  She has pain, and intermittent episodes of swelling.  Today, she was having lunch in her car when she felt that she could see something and decided to use her tweezers to pull, what she perceived was a foreign body, out.  There was no success, and so she came to the ER.  Home Medications Prior to Admission medications   Medication Sig Start Date End Date Taking? Authorizing Provider  amphetamine -dextroamphetamine  (ADDERALL) 30 MG tablet Take 1 tablet by mouth 2 (two) times daily. 08/12/22 08/12/23  Nwoko, Uchenna E, PA  amphetamine -dextroamphetamine  (ADDERALL) 30 MG tablet Take 1 tablet by mouth 2 (two) times daily. 09/11/22 10/11/22  Nwoko, Uchenna E, PA  amphetamine -dextroamphetamine  (ADDERALL) 30 MG tablet Take 1 tablet by mouth 2 (two) times daily. 11/10/22 12/10/22  Arlyne Bering, NP  amphetamine -dextroamphetamine  (ADDERALL) 30 MG tablet Take 1 tablet by mouth 2 (two) times daily. 02/08/23 03/10/23  Nwoko, Uchenna E, PA  clindamycin  (CLEOCIN  T) 1 % external solution Apply to face every morning for acne 02/14/21   Artemio Larry, MD  clobetasol  cream (TEMOVATE ) 0.05 % Apply to affected areas rash once to twice daily for itch. Avoid  face, groin, underarms. 07/30/22   Artemio Larry, MD  DIFFERIN  0.3 % gel Apply a pea-sized amount to face every night as tolerated for acne. 02/11/21   Artemio Larry, MD  ELIDEL  1 % cream Apply to rash on arm twice a day until improved. 02/11/21   Artemio Larry, MD  hydrOXYzine  (ATARAX ) 25 MG tablet Take 1 tablet (25 mg total) by mouth 3 (three) times daily as needed. 11/29/21   Nwoko, Uchenna E, PA  nicotine  (NICODERM CQ  - DOSED IN MG/24 HOURS) 21 mg/24hr patch 21 mg daily. 04/24/23   [provider]  pantoprazole (PROTONIX) 20 MG tablet Take 20 mg by mouth daily. 04/17/23   [provider]  predniSONE  (STERAPRED UNI-PAK 48 TAB) 10 MG (48) TBPK tablet Take by mouth daily. 12-day taper pack, use as directed for taper 07/03/23   Ziglar, Susan K, MD  predniSONE  (STERAPRED UNI-PAK 48 TAB) 10 MG (48) TBPK tablet Take by mouth daily. 12-day taper pack, use as directed for taper 07/29/23   Ziglar, Susan K, MD  QUEtiapine  (SEROQUEL ) 100 MG tablet Take 1 tablet (100 mg total) by mouth at bedtime. 12/01/22   Nwoko, Uchenna E, PA  sertraline  (ZOLOFT ) 100 MG tablet Take 1 tablet (100 mg total) by mouth daily. 12/01/22 12/01/23  Nwoko, Uchenna E, PA  SUBOXONE 8-2 MG FILM Place under the tongue. Patient not taking: Reported on 04/28/2023 02/19/23   [provider]  Allergies    Other, Azithromycin , and Nitrofurantoin    Review of Systems   Review of Systems  All other systems reviewed and are negative.   Physical Exam Updated Vital Signs BP (!) 144/90 (BP Location: Right Arm)   Pulse 74   Temp 98.7 F (37.1 C)   Resp 17   SpO2 100%  Physical Exam Vitals and nursing note reviewed.  Constitutional:      Appearance: She is well-developed.  HENT:     Head: Atraumatic.  Cardiovascular:     Rate and Rhythm: Normal rate.  Pulmonary:     Effort: Pulmonary effort is normal.  Musculoskeletal:     Cervical back: Normal range of motion and neck supple.  Skin:    General: Skin is warm  and dry.  Neurological:     Mental Status: She is alert and oriented to person, place, and time.         ED Results / Procedures / Treatments   Labs (all labs ordered are listed, but only abnormal results are displayed) Labs Reviewed - No data to display  EKG None  Radiology No results found.  Procedures Procedures    Medications Ordered in ED Medications - No data to display  ED Course/ Medical Decision Making/ A&P                                 Medical Decision Making  33 year old patient comes in with chief complaint of head wound.  This is an old wound, that just has not healed well.  Primarily this is leading to some cosmetic issues for the patient and chronic discomfort.  I tried to reach out to plastic surgery APP.  Unfortunately, I was unable to get a response from them immediately.  Ultimately we discharge patient with information for Dr. Donaciano Frizzle him to call and follow-up.  After my shift, I did receive a message from plastic surgery that patient should likely just follow-up with the ENT or oculoplastics.  We do not have oculoplastics.  I called the patient on 5-23 and gave her information on Dr. Ralston Burkes - who is on call face/ENT.  She will call him for a follow-up appointment. Final Clinical Impression(s) / ED Diagnoses Final diagnoses:  Chronic wound of head    Rx / DC Orders ED Discharge Orders     None         Deatra Face, MD 08/21/23 1441

## 2023-08-20 NOTE — ED Triage Notes (Signed)
 Pt c/o eyebrow lac "awhile ago- about 2 months ago," c/o foreign body after "being hit w a rock while weed eating." Went to UC for same approx a month ago, advised provider "wouldn't go digging if there wasn't anything in it." C/o pain, scar tissue, potential foreign body remaining.

## 2023-08-20 NOTE — Telephone Encounter (Signed)
 Sent Dr Orin Birk a message to find out who is on call today, pt called from [facility] ED saying she was told to follow up with plastic surgery. Dr D did not treat pt but checking with her to see if she can see pt

## 2023-09-25 ENCOUNTER — Other Ambulatory Visit: Payer: Self-pay | Admitting: Family Medicine

## 2023-09-25 MED ORDER — PANTOPRAZOLE SODIUM 20 MG PO TBEC: 20.0000 mg | DELAYED_RELEASE_TABLET | Freq: Every day | ORAL | 1 refills | Status: AC

## 2023-09-25 NOTE — Telephone Encounter (Unsigned)
 Copied from CRM [id number]. Topic: Clinical - Medication Refill >> Sep 25, 2023 11:47 AM DeAngela L wrote: Medication: pantoprazole (PROTONIX) 20 MG tablet Methocarbamol 500 Has the patient contacted their pharmacy? No  (Agent: If no, request that the patient contact the pharmacy for the refill. If patient does not wish to contact the pharmacy document the reason why and proceed with request.) (Agent: If yes, when and what did the pharmacy advise?)  This is the patient's preferred pharmacy:  [facility] [address] [place] [address] Phone: [phone]  Is this the correct pharmacy for this prescription? Yes  If no, delete pharmacy and type the correct one.   Has the prescription been filled recently? Yes   Is the patient out of the medication? No    Has the patient been seen for an appointment in the last year OR does the patient have an upcoming appointment? Yes   Can we respond through MyChart? Yes   Agent: Please be advised that Rx refills may take up to 3 business days. We ask that you follow-up with your pharmacy.

## 2023-09-28 ENCOUNTER — Telehealth (INDEPENDENT_AMBULATORY_CARE_PROVIDER_SITE_OTHER): Admitting: Family Medicine

## 2023-09-28 DIAGNOSIS — M62838 Other muscle spasm: Secondary | ICD-10-CM | POA: Insufficient documentation

## 2023-09-28 MED ORDER — METHOCARBAMOL 750 MG PO TABS: 1500.0000 mg | ORAL_TABLET | Freq: Four times a day (QID) | ORAL | 0 refills | Status: DC

## 2023-09-28 NOTE — Progress Notes (Signed)
 Established Patient Office Visit  Subjective   Patient ID: Doris Roberson, female    DOB: April 12, 1990  Age: 33 y.o. MRN: [id number]  No chief complaint on file.   HPI Virtual Visit via Video Note  I connected with Doris Roberson on 09/28/23 at  2:50 PM EDT by a video enabled telemedicine application and verified that I am speaking with the correct person using two identifiers.  Location: Patient: Doris Roberson was at work Provider: Dr. Kashish Yglesias was at [facility] at [place], [address] [place], [place] [address]   I discussed the limitations of evaluation and management by telemedicine and the availability of in person appointments. The patient expressed understanding and agreed to proceed.  History of Present Illness: She hurt her neck 2 weeks ago helping her father get a grill of the back of his truck for Father's Day.  Since then her neck has been sore and stiff.  She has no numbness or tingling in either arm or hand.  She has been taking Flexeril and does not think it is strong enough.  Basically it just makes her sleepy but it is not helping her neck.  She is also taking Advil  like skittles.  She does eat before she takes Advil .  She wants to know if there is a stronger muscle relaxer than Flexeril.  She is able to turn her head but her neck is stiff and is painful to do that.  Both sides of her neck are about equally painful.  Review of her chart does not show that she has a prescription for Flexeril.  She reports that it may be an old prescription.  Discussed switching to a different kind of muscle relaxer to see if it would be more beneficial.   Observations/Objective:   Assessment and Plan: Neck muscle spasm without signs of radiculopathy.  Will try different type of muscle relaxer.  Trial of methocarbamol 1500 mg every 6 hours.  May take with Advil  800 mg every 8 hours.  Please take Advil  with food.  Try gentle stretching exercises.  Offered  physical therapy but she has declined at this point.  Follow Up Instructions:    I discussed the assessment and treatment plan with the patient. The patient was provided an opportunity to ask questions and all were answered. The patient agreed with the plan and demonstrated an understanding of the instructions.   The patient was advised to call back or seek an in-person evaluation if the symptoms worsen or if the condition fails to improve as anticipated.  I provided 6 minutes of non-face-to-face time during this encounter.   Devere MARLA Mock, MD     ROS    Objective:     There were no vitals taken for this visit.   Physical Exam       No results found for any visits on 09/28/23.    The ASCVD Risk score (Arnett DK, et al., 2019) failed to calculate for the following reasons:   The 2019 ASCVD risk score is only valid for ages 62 to 51    Assessment & Plan:  Neck muscle spasm Assessment & Plan: She is taking flexeril and it is not helping at all.  It is mostly just making her sleepy.  No radiculopathy.  Offered to try a different muscle relaxer.  Methocarbamol 750mg  2 every 6 hours.  Also offered PT but she declined.    Orders: -     Methocarbamol; Take 2 tablets (  1,500 mg total) by mouth 4 (four) times daily.  Dispense: 80 tablet; Refill: 0     Return if symptoms worsen or fail to improve.    Gracelyn Coventry K Amr Sturtevant, MD

## 2023-09-28 NOTE — Assessment & Plan Note (Signed)
 She is taking flexeril and it is not helping at all.  It is mostly just making her sleepy.  No radiculopathy.  Offered to try a different muscle relaxer.  Methocarbamol 750mg  2 every 6 hours.  Also offered PT but she declined.

## 2023-10-21 ENCOUNTER — Encounter: Payer: Self-pay | Admitting: Family Medicine

## 2023-10-21 ENCOUNTER — Other Ambulatory Visit: Payer: Self-pay | Admitting: Family Medicine

## 2023-10-21 DIAGNOSIS — F339 Major depressive disorder, recurrent, unspecified: Secondary | ICD-10-CM

## 2023-10-21 NOTE — Telephone Encounter (Unsigned)
 Copied from CRM [id number]. Topic: Clinical - Medication Refill >> Oct 21, 2023  8:40 AM Larissa S wrote: Medication: QUEtiapine  (SEROQUEL ) 100 MG tablet  Has the patient contacted their pharmacy? Yes (Agent: If no, request that the patient contact the pharmacy for the refill. If patient does not wish to contact the pharmacy document the reason why and proceed with request.) (Agent: If yes, when and what did the pharmacy advise?)  This is the patient's preferred pharmacy:  [facility] #[id number] [place], [place] - [address] AT [facility] [address] [place] [address] Phone: [phone] Fax: [phone]  Is this the correct pharmacy for this prescription? Yes If no, delete pharmacy and type the correct one.   Has the prescription been filled recently? No  Is the patient out of the medication? Yes  Has the patient been seen for an appointment in the last year OR does the patient have an upcoming appointment? Yes  Can we respond through MyChart? Yes  Agent: Please be advised that Rx refills may take up to 3 business days. We ask that you follow-up with your pharmacy.

## 2023-10-21 NOTE — Telephone Encounter (Signed)
 Needs to be prescribed by psychiatry.

## 2023-10-21 NOTE — Telephone Encounter (Signed)
 Patient has not been seen in office since 04/28/23. Last video visit was 07/29/23. Please advise.

## 2023-11-11 ENCOUNTER — Other Ambulatory Visit: Payer: Self-pay | Admitting: Family Medicine

## 2023-11-11 DIAGNOSIS — M62838 Other muscle spasm: Secondary | ICD-10-CM

## 2023-11-11 MED ORDER — METHOCARBAMOL 750 MG PO TABS: 1500.0000 mg | ORAL_TABLET | Freq: Four times a day (QID) | ORAL | 0 refills | Status: DC

## 2023-11-11 NOTE — Telephone Encounter (Signed)
 Copied from CRM [id number]. Topic: Clinical - Medication Refill >> Nov 11, 2023  3:19 PM Ivette P wrote: Medication: methocarbamol  (ROBAXIN ) 750 MG tablet  Has the patient contacted their pharmacy? No (Agent: If no, request that the patient contact the pharmacy for the refill. If patient does not wish to contact the pharmacy document the reason why and proceed with request.) (Agent: If yes, when and what did the pharmacy advise?)  This is the patient's preferred pharmacy:  [facility] #[id number] [place], [place] - [address] AT [facility] OF [address] [place] [address] Phone: [phone] Fax: [phone]  Is this the correct pharmacy for this prescription? Yes If no, delete pharmacy and type the correct one.   Has the prescription been filled recently? No, 09/28/2023  Is the patient out of the medication? Yes, 2 left   Has the patient been seen for an appointment in the last year OR does the patient have an upcoming appointment? Yes, 09/28/2023  Can we respond through MyChart? Yes  Agent: Please be advised that Rx refills may take up to 3 business days. We ask that you follow-up with your pharmacy.

## 2023-11-23 ENCOUNTER — Ambulatory Visit: Payer: Self-pay

## 2023-11-23 NOTE — Telephone Encounter (Signed)
 FYI Only or Action Required?: FYI only for provider.  Patient was last seen in primary care on 09/28/2023 by Ziglar, Susan K, MD.  Called Nurse Triage reporting Nasal Congestion.  Symptoms began several days ago.  Interventions attempted: OTC medications: Zyzal, Benadryl .  Symptoms are: gradually worsening.  Triage Disposition: See PCP When Office is Open (Within 3 Days)  Patient/caregiver understands and will follow disposition?: Yes        Reason for Disposition  Lots of coughing  Answer Assessment - Initial Assessment Questions 1. LOCATION: Where does it hurt?      No pain, just congestion 2. ONSET: When did the sinus pain start?  (e.g., hours, days)      X 2 days 3. SEVERITY: How bad is the pain?   (Scale 0-10; or none, mild, moderate or severe)     Congestion 5. NASAL CONGESTION: Is the nose blocked? If Yes, ask: Can you open it or must you breathe through your mouth?     Yes 6. NASAL DISCHARGE: Do you have discharge from your nose? If so ask, What color?     Clear 7. FEVER: Do you have a fever? If Yes, ask: What is it, how was it measured, and when did it start?      None 8. OTHER SYMPTOMS: Do you have any other symptoms? (e.g., sore throat, cough, earache, difficulty breathing)     Cough, sore throat 9. PREGNANCY: Is there any chance you are pregnant? When was your last menstrual period?     None  Protocols used: Sinus Pain or Congestion-A-AH

## 2023-11-23 NOTE — Telephone Encounter (Signed)
 Patient called, left VM to return the call to the office to speak to NT.     Message from [facility] G sent at 11/23/2023  8:27 AM EDT  headache, sore throat, cough, stuffed up nose

## 2023-11-24 ENCOUNTER — Encounter: Payer: Self-pay | Admitting: Family Medicine

## 2023-11-24 ENCOUNTER — Telehealth (INDEPENDENT_AMBULATORY_CARE_PROVIDER_SITE_OTHER): Admitting: Family Medicine

## 2023-11-24 DIAGNOSIS — J069 Acute upper respiratory infection, unspecified: Secondary | ICD-10-CM

## 2023-11-24 MED ORDER — AMOXICILLIN-POT CLAVULANATE 875-125 MG PO TABS: 1.0000 | ORAL_TABLET | Freq: Two times a day (BID) | ORAL | 0 refills | Status: AC

## 2023-11-24 MED ORDER — PREDNISONE 20 MG PO TABS
40.0000 mg | ORAL_TABLET | Freq: Every day | ORAL | 0 refills | Status: AC
Start: 2023-11-24 — End: 2023-11-29

## 2023-11-24 NOTE — Patient Instructions (Signed)
 Smoking Cessation: QuitlineNC 1-800-QUIT-NOW 670-772-4699); Espaol: 1-855-Djelo-Ya (1-737-271-8849) http://[email or web address]-[email or web address].info/

## 2023-11-24 NOTE — Progress Notes (Signed)
 Virtual Visit via Video Note  I connected with [name] on 11/24/23 at 3:28PM by a video enabled telemedicine application and verified that I am speaking with the correct person using two identifiers.  Patient Location: Home Provider Location: Office/Clinic  I discussed the limitations, risks, security, and privacy concerns of performing an evaluation and management service by video and the availability of in person appointments. I also discussed with the patient that there may be a patient responsible charge related to this service. The patient expressed understanding and agreed to proceed.  Subjective: PCP: Ziglar, Susan K, MD  Chief Complaint  Patient presents with   Telehealth Consent    Congested nasal and cough no fever and no bodyache started Sunday- taking xyxal    Discussed the use of AI scribe software for clinical note transcription with the patient, who gave verbal consent to proceed.  History of Present Illness    Doris Roberson is a 33 year old female who presents with nasal congestion and cough since Sunday.   She has been experiencing nasal congestion and cough since Sunday, which she describes as feeling like allergies. No body aches, fever, chills, or shortness of breath. She also reports no nausea or vomiting, although she mentions not eating much today but did eat yesterday.  She notes a headache, which is unusual for her as she typically does not get headaches. She feels a bit swollen in the sinus area and reports a headache, which is unusual for her.  She has taken a home test for COVID, flu A, and flu B, which returned negative results.  She has a history of allergies to Bactrim and azithromycin . She has tolerated Augmentin  in the past, although it can upset her stomach if taken without food.     ROS: Per HPI  Current Outpatient Medications:    amoxicillin -clavulanate (AUGMENTIN ) 875-125 MG tablet, Take 1 tablet by mouth 2 (two) times daily  for 7 days., Disp: 14 tablet, Rfl: 0   amphetamine -dextroamphetamine  (ADDERALL) 30 MG tablet, Take 1 tablet by mouth 2 (two) times daily., Disp: 60 tablet, Rfl: 0   amphetamine -dextroamphetamine  (ADDERALL) 30 MG tablet, Take 1 tablet by mouth 2 (two) times daily., Disp: 60 tablet, Rfl: 0   amphetamine -dextroamphetamine  (ADDERALL) 30 MG tablet, Take 1 tablet by mouth 2 (two) times daily., Disp: 60 tablet, Rfl: 0   amphetamine -dextroamphetamine  (ADDERALL) 30 MG tablet, Take 1 tablet by mouth 2 (two) times daily., Disp: 60 tablet, Rfl: 0   clindamycin  (CLEOCIN  T) 1 % external solution, Apply to face every morning for acne, Disp: 60 mL, Rfl: 3   clobetasol  cream (TEMOVATE ) 0.05 %, Apply to affected areas rash once to twice daily for itch. Avoid face, groin, underarms., Disp: 60 g, Rfl: 0   DIFFERIN  0.3 % gel, Apply a pea-sized amount to face every night as tolerated for acne., Disp: 45 g, Rfl: 3   ELIDEL  1 % cream, Apply to rash on arm twice a day until improved., Disp: 60 g, Rfl: 1   hydrOXYzine  (ATARAX ) 25 MG tablet, Take 1 tablet (25 mg total) by mouth 3 (three) times daily as needed., Disp: 75 tablet, Rfl: 2   methocarbamol  (ROBAXIN ) 750 MG tablet, Take 2 tablets (1,500 mg total) by mouth 4 (four) times daily., Disp: 80 tablet, Rfl: 0   nicotine  (NICODERM CQ  - DOSED IN MG/24 HOURS) 21 mg/24hr patch, 21 mg daily., Disp: , Rfl:    pantoprazole  (PROTONIX ) 20 MG tablet, Take 1 tablet (20 mg  total) by mouth daily., Disp: 90 tablet, Rfl: 1   predniSONE  (DELTASONE ) 20 MG tablet, Take 2 tablets (40 mg total) by mouth daily with breakfast for 5 days., Disp: 10 tablet, Rfl: 0   QUEtiapine  (SEROQUEL ) 100 MG tablet, Take 1 tablet (100 mg total) by mouth at bedtime., Disp: 90 tablet, Rfl: 0   sertraline  (ZOLOFT ) 100 MG tablet, Take 1 tablet (100 mg total) by mouth daily., Disp: 90 tablet, Rfl: 0   predniSONE  (STERAPRED UNI-PAK 48 TAB) 10 MG (48) TBPK tablet, Take by mouth daily. 12-day taper pack, use as directed  for taper (Patient not taking: Reported on 11/24/2023), Disp: 1 tablet, Rfl: 0   predniSONE  (STERAPRED UNI-PAK 48 TAB) 10 MG (48) TBPK tablet, Take by mouth daily. 12-day taper pack, use as directed for taper (Patient not taking: Reported on 11/24/2023), Disp: 48 tablet, Rfl: 0   SUBOXONE 8-2 MG FILM, Place under the tongue. (Patient not taking: Reported on 11/24/2023), Disp: , Rfl:   Observations/Objective: There were no vitals filed for this visit.  General: Alert and oriented x 4. Speaking in clear and full sentences, no audible heavy breathing, no acute distress.  Dry cough audible. Sounds alert and appropriately interactive.  Appears well.  Face symmetric.  Extraocular movements intact.  Pupils equal and round.  No nasal flaring or accessory muscle use visualized.  Assessment and Plan:  1. Acute URI (Primary) Symptoms suggest viral sinus infection. Negative for COVID, flu A, and flu B. Possible bacterial sinus infection if symptoms persist beyond 7-10 days. Prescribed Augmentin , and advised patient to pick up antibiotic if symptoms persist until Friday. Prescribed 40 mg prednisone  for 5 days. Work note available through [employer]. - predniSONE  (DELTASONE ) 20 MG tablet; Take 2 tablets (40 mg total) by mouth daily with breakfast for 5 days.  Dispense: 10 tablet; Refill: 0 - amoxicillin -clavulanate (AUGMENTIN ) 875-125 MG tablet; Take 1 tablet by mouth 2 (two) times daily for 7 days.  Dispense: 14 tablet; Refill: 0   Follow Up Instructions: Return if symptoms worsen or fail to improve.   I discussed the assessment and treatment plan with the patient. The patient was provided an opportunity to ask questions, and all were answered. The patient agreed with the plan and demonstrated an understanding of the instructions.   The patient was advised to call back or seek an in-person evaluation if the symptoms worsen or if the condition fails to improve as anticipated.  The above assessment and management  plan was discussed with the patient. The patient verbalized understanding of and has agreed to the management plan.   [name], FNP

## 2023-12-03 ENCOUNTER — Telehealth: Payer: Self-pay

## 2023-12-03 NOTE — Telephone Encounter (Signed)
 Copied from CRM #[id number]. Topic: Clinical - Medical Advice >> Dec 03, 2023 11:48 AM Anairis L wrote: Reason for RMF:[id number] was seen on 11/26/2023 in Urgent care and provider is requesting a referral to general surgery. Patient can be reach by Mychart or Phone.

## 2023-12-04 ENCOUNTER — Encounter: Payer: Self-pay | Admitting: Family Medicine

## 2023-12-04 ENCOUNTER — Other Ambulatory Visit: Payer: Self-pay | Admitting: Family Medicine

## 2023-12-04 DIAGNOSIS — L0591 Pilonidal cyst without abscess: Secondary | ICD-10-CM

## 2023-12-11 ENCOUNTER — Other Ambulatory Visit: Payer: Self-pay | Admitting: Family Medicine

## 2023-12-11 DIAGNOSIS — M62838 Other muscle spasm: Secondary | ICD-10-CM

## 2023-12-16 NOTE — Telephone Encounter (Signed)
 Pt called in because refill was requested on the 12th and she has been out of medication. She has not been updated and would like for a callback on the status of when she can receive her refill.

## 2023-12-31 ENCOUNTER — Ambulatory Visit: Payer: Self-pay | Admitting: General Surgery

## 2023-12-31 ENCOUNTER — Ambulatory Visit (INDEPENDENT_AMBULATORY_CARE_PROVIDER_SITE_OTHER): Payer: Self-pay | Admitting: General Surgery

## 2023-12-31 ENCOUNTER — Encounter: Payer: Self-pay | Admitting: General Surgery

## 2023-12-31 ENCOUNTER — Telehealth: Payer: Self-pay | Admitting: General Surgery

## 2023-12-31 VITALS — BP 123/84 | HR 97 | Temp 98.2°F | Ht 66.0 in | Wt 143.0 lb

## 2023-12-31 DIAGNOSIS — L0501 Pilonidal cyst with abscess: Secondary | ICD-10-CM

## 2023-12-31 DIAGNOSIS — L0591 Pilonidal cyst without abscess: Secondary | ICD-10-CM | POA: Diagnosis not present

## 2023-12-31 NOTE — Telephone Encounter (Signed)
 Patient has been advised of Pre-Admission date/time, and Surgery date at [facility].  Surgery Date: 01/15/24 Preadmission Testing Date: 01/12/24 (phone 8a-1p)  Patient informed of the scheduling process and surgery information given at time of office visit.   Patient has been made aware to call [phone], between 1-3:00pm the day before surgery, to find out what time to arrive for surgery.

## 2023-12-31 NOTE — Patient Instructions (Signed)
 You have been seen today for a Pilonidal Cyst. You have requested to have your pilonidal cyst excised. This will be done at [facility] with Dr. Marinda.  If you are on any injectable weight loss medication, you will need to stop taking your GLP-1 injectable (weight loss) medications 8 days before your surgery to avoid any complications with anesthesia.   You will need to arrange to be out of work for approximately 1-2 weeks and then have a family member change the dressing 1-2 times daily until this heals from the inside out.   If you have FMLA or disability paperwork that needs filled out you may drop this off at our office or this can be faxed to [phone].  Please see the (blue)pre-care form that you have been given today. Our surgery scheduler will call you to verify surgery date and to go over information.   If you have any questions, please call our office.   Pilonidal Cyst Removal Pilonidal cyst removal is a procedure to remove a fluid-filled sac (cyst) that forms under the skin near the tailbone, at the top of the crease between the buttocks (pilonidal area). This procedure is also called a pilonidal cystectomy.  Pilonidal cyst is caused by an ingrown hair that irritates the area. Sometimes a tunnel (sinus) forms under the skin from the cyst and makes a second opening in the skin. In that case, the sinus area may also be removed during the procedure. You may need this procedure if you have a cyst that is large, painful, or keeps getting infected. A cyst that becomes infected is called an abscess. The abscess may need to be opened, drained, and treated with antibiotics before the cyst is removed. Tell your health care provider about: Any allergies you have. All medicines you are taking, including vitamins, herbs, eye drops, creams, and over-the-counter medicines. Any problems you or family members have had with anesthetic medicines. Any bleeding problems you have. Any  surgeries you have had. Any medical conditions you have. Whether you are pregnant or may be pregnant. Any recent fever, increase in pain, or discharge from the cyst. What are the risks? Your health care provider will talk with you about risks. These may include: Delay in healing. This is the most common problem. Infection. Bleeding. Allergic reactions to medicines. A closed incision opening. The cyst coming back again (recurrence). What happens before the procedure? Medicines Ask your health care provider about: Changing or stopping your regular medicines. These include any diabetes medicines or blood thinners you take. Taking medicines such as aspirin and ibuprofen . These medicines can thin your blood. Do not take them unless your health care provider tells you to. Taking over-the-counter medicines, vitamins, herbs, and supplements. Surgery safety Ask your health care provider: How your surgery site will be marked. What steps will be taken to help prevent infection. These steps may include: Removing hair at the surgery site. Washing skin with a soap that kills germs. Taking antibiotics. General instructions Do not use any products that contain nicotine  or tobacco for at least 4 weeks before the procedure. These products include cigarettes, chewing tobacco, and vaping devices, such as e-cigarettes. If you need help quitting, ask your health care provider. If you will be going home right after the procedure, plan to have a responsible adult: Take you home from the hospital or clinic. You will not be allowed to drive. Care for you for the time you are told. You may need help with wound care  and dressing changes. What happens during the procedure?  An IV will be inserted into one of your veins. You may be given: A sedative. This helps you relax. Anesthesia. This will: Numb certain areas of your body. Make you fall asleep for surgery. Your surgeon will make an incision near the  cyst. Depending on the size of the cyst and if the sinus is infected, one of the following will be done: If there is an abscess, a small hole will be made in the cyst. The pus will be drained out. If the sinus is large or keeps getting infected, your surgeon may: Cut out the sinus and remove some of the skin around it. The wound will be left open to heal on its own. Remove the sinus and cut out a flap on either side of it. The two sides will be stitched together. A thin, flexible tube with a camera (endoscope) may be used before this procedure to better see the area. The surgeon may remove hair and infected tissue. The sinus will then be cleaned with a solution. Heat will be used to seal the sinus. The incision may be left open or closed. An open incision may be packed with gauze and covered with a bandage (dressing). An incision may be closed with stitches (sutures) and covered with a dressing. The area may be sealed with fibrin glue and covered with a dressing. The procedure may vary among health care providers and hospitals. What happens after the procedure? Your blood pressure, heart rate, breathing rate, and blood oxygen level will be monitored until you leave the hospital or clinic. You will be given medicine for pain as needed. If you were given a sedative during the procedure, it can affect you for several hours. Do not drive or operate machinery until your health care provider says that it is safe. Your health care provider will give you instructions for taking care of your dressing at home after the procedure. If your incision was left open and packed with gauze, you will need to change your dressing every day. Summary Pilonidal cyst removal is surgery to remove a fluid-filled sac (cyst) that forms in the crease between the buttocks. The incision used to remove the cyst may be closed with sutures or left open. If left open, it may be packed with gauze and covered with a dressing. You  will be given medicine for pain as needed. Your health care provider will give you instructions for taking care of your dressing at home. This information is not intended to replace advice given to you by your health care provider. Make sure you discuss any questions you have with your health care provider. Document Revised: 06/21/2021 Document Reviewed: 06/21/2021 Elsevier Patient Education  2024 [employer].

## 2024-01-01 ENCOUNTER — Ambulatory Visit: Payer: Self-pay

## 2024-01-01 ENCOUNTER — Telehealth (INDEPENDENT_AMBULATORY_CARE_PROVIDER_SITE_OTHER): Admitting: Family Medicine

## 2024-01-01 DIAGNOSIS — M542 Cervicalgia: Secondary | ICD-10-CM

## 2024-01-01 DIAGNOSIS — M62838 Other muscle spasm: Secondary | ICD-10-CM

## 2024-01-01 DIAGNOSIS — T7840XD Allergy, unspecified, subsequent encounter: Secondary | ICD-10-CM

## 2024-01-01 DIAGNOSIS — J301 Allergic rhinitis due to pollen: Secondary | ICD-10-CM

## 2024-01-01 DIAGNOSIS — T7840XA Allergy, unspecified, initial encounter: Secondary | ICD-10-CM | POA: Insufficient documentation

## 2024-01-01 MED ORDER — TIZANIDINE HCL 4 MG PO TABS: 4.0000 mg | ORAL_TABLET | Freq: Three times a day (TID) | ORAL | 2 refills | Status: AC

## 2024-01-01 MED ORDER — PREDNISONE 20 MG PO TABS: 40.0000 mg | ORAL_TABLET | Freq: Every day | ORAL | 0 refills | Status: DC

## 2024-01-01 NOTE — Assessment & Plan Note (Signed)
 Sinus congestion and headache.  Rhinorrhea is all clear .  Not responding to oral antihistamines.  Will try burst of steroids.

## 2024-01-01 NOTE — Assessment & Plan Note (Signed)
 Reports methocarbamol  makes her sleepy but it does work on her neck.  She has had no injury to her neck she does has tense muscles and her head feels heavy.  Will trial tizanidine 4 mg every 8 hours

## 2024-01-01 NOTE — Progress Notes (Signed)
 Established Patient Office Visit  Subjective   Patient ID: Doris Roberson, female    DOB: [birth year]  Age: 33 y.o. MRN: [id number]  No chief complaint on file. Virtual Visit via Video Note  I connected with Doris Roberson on 01/01/24 at 11:10 AM EDT by a video enabled telemedicine application and verified that I am speaking with the correct person using two identifiers.  Location: Patient: at work Provider: at work, in my office   I discussed the limitations of evaluation and management by telemedicine and the availability of in person appointments. The patient expressed understanding and agreed to proceed.  History of Present Illness: Delightful 33 year old with allergic rhinitis and chronic neck pain.  She does not know if the weather is changed and maybe that is why her sinuses are worse but she suddenly has congestion, headache, postnasal drip.  All of her congestion is clear.  She is not running a fever.  She does not have a sore throat.  She has been taking oral antihistamines and not getting enough benefit.  She had a cough for the last 2 nights which has kept her awake so she is very tired today.  She denies myalgias.  She complains that her neck hurts she thinks is bad because she keeps her neck muscles so tense or maybe it is the way that she sleeps.  Her head feels heavy.  She has no numbness and tingling in any extremity and does not feel any weakness in her arms or legs.  She has been taking methocarbamol  which is effective however it makes her very sleepy.   Observations/Objective:   Assessment and Plan:  Allergic rhinitis: Will try a burst of prednisone  40 mg daily for 5 days and see if this helps her.  Please continue to use antihistamines and Zyrtec 10 mg daily and Flonase  1 spray each nostril daily.  Cervical muscle spasms: Will do a trial of tizanidine 4 mg every 8 hours and see how she does with this.  Follow Up Instructions:    I discussed the  assessment and treatment plan with the patient. The patient was provided an opportunity to ask questions and all were answered. The patient agreed with the plan and demonstrated an understanding of the instructions.   The patient was advised to call back or seek an in-person evaluation if the symptoms worsen or if the condition fails to improve as anticipated.  I provided 8 minutes of non-face-to-face time during this encounter.   Devere MARLA Mock, MD           Objective:                Assessment & Plan:  Allergy, subsequent encounter -     predniSONE ; Take 2 tablets (40 mg total) by mouth daily with breakfast.  Dispense: 10 tablet; Refill: 0  Neck pain -     tiZANidine HCl; Take 1 tablet (4 mg total) by mouth 3 (three) times daily.  Dispense: 30 tablet; Refill: 2  Allergic rhinitis due to pollen, unspecified seasonality Assessment & Plan: Sinus congestion and headache.  Rhinorrhea is all clear .  Not responding to oral antihistamines.  Will try burst of steroids.   Neck muscle spasm Assessment & Plan: Reports methocarbamol  makes her sleepy but it does work on her neck.  She has had no injury to her neck she does has tense muscles and her head feels heavy.  Will trial tizanidine 4 mg every 8 hours  Return if symptoms worsen or fail to improve.    Tavien Chestnut K Advait Buice, MD

## 2024-01-01 NOTE — Telephone Encounter (Signed)
 FYI Only or Action Required?: FYI only for provider.  Patient was last seen in primary care on 11/24/2023 by Towana Small, FNP.  Called Nurse Triage reporting No chief complaint on file..  Symptoms began several days ago.  Interventions attempted: OTC medications: delsym.  Symptoms are: gradually worsening.  Triage Disposition: No disposition on file.  Patient/caregiver understands and will follow disposition?:   Copied from CRM [id number]. Topic: Clinical - Red Word Triage >> Jan 01, 2024  9:25 AM Adelita E wrote: [facility] that prompted transfer to Nurse Triage: Allergy flare-up. Productive cough with clear mucous, started yesterday. Reason for Disposition  [1] Continuous (nonstop) coughing interferes with work or school AND [2] no improvement using cough treatment per Care Advice  Answer Assessment - Initial Assessment Questions Pt began have cold sxs about a week ago, cough began two nights ago. States it is around consistently, and worsens at night. Taking Delsym OTC but requesting visit for potential sxs management or recommendations.   1. ONSET: When did the cough begin?      Two nights ago  2. SEVERITY: How bad is the cough today?      Constantly, worse at night 3. SPUTUM: Describe the color of your sputum (e.g., none, dry cough; clear, white, yellow, green)     Clear mucous 4. HEMOPTYSIS: Are you coughing up any blood? If Yes, ask: How much? (e.g., flecks, streaks, tablespoons, etc.)     denies 5. DIFFICULTY BREATHING: Are you having difficulty breathing? If Yes, ask: How bad is it? (e.g., mild, moderate, severe)      Congested difficulty breathing through nose 6. FEVER: Do you have a fever? If Yes, ask: What is your temperature, how was it measured, and when did it start?     denies 7. CARDIAC HISTORY: Do you have any history of heart disease? (e.g., heart attack, congestive heart failure)      denies 8. LUNG HISTORY: Do you have any history of  lung disease?  (e.g., pulmonary embolus, asthma, emphysema)     denies 9. PE RISK FACTORS: Do you have a history of blood clots? (or: recent major surgery, recent prolonged travel, bedridden)     denies 10. OTHER SYMPTOMS: Do you have any other symptoms? (e.g., runny nose, wheezing, chest pain)       congestion 11. PREGNANCY: Is there any chance you are pregnant? When was your last menstrual period?       denies 15. TRAVEL: Have you traveled out of the country in the last month? (e.g., travel history, exposures)       denies  Protocols used: Cough - Acute Productive-A-AH

## 2024-01-06 NOTE — Progress Notes (Signed)
 Patient ID: Doris Roberson, female   DOB: 12-18-1990, 33 y.o.   MRN: [id number] CC: Pilonidal Cyst Disease History of Present Illness Doris Roberson is a 33 y.o. female with .  Past medical history as below who presents in consultation for pilonidal cyst disease.  The patient reports that several weeks ago she had an area of swelling and pain in her gluteal cleft.  She went to the urgent care and there was found to have a pilonidal cyst abscess.  She had an incision and drainage of the abscess and was started on antibiotics.  She reports since then the area has healed well.  She has no further drainage.  She does report that previous to this episode she did have 1 episode of pilonidal cyst disease in the past.  She denies any blood in her stool.  She denies any fevers or  Past Medical History Past Medical History:  Diagnosis Date   Alcohol abuse    s/p inpatient rehab at [facility] in 2011, two DUI's   Bacteria in urine 04/14/2016   Depression    h/o Tylenol  pm overdose, subsequent admision to behavioral health   First trimester screening 10/22/2015   Hyperemesis complicating pregnancy, antepartum 03/15/2016   Labor and delivery, indication for care 05/05/2016   Mood disorder    Pregnant state, incidental 08/14/2010   Rh negative status during pregnancy in second trimester 01/31/2016   Overview:   Rhogam given on 02/15/16   Smoking (tobacco) complicating pregnancy, third trimester 01/31/2016   Overview:   Smoking 0.5 PPD, sometimes less 6 cigs/day   Growth US  every 4 weeks while smoking 0.5 PPD - encouraged to quit  NSTs starting at 36 weeks if she is still smoking 0.5 PPD  Pt is smoking < 1/2 pack a day. TJS stated we do not need NST weekly now.    Tobacco use in pregnancy, antepartum 10/05/2015   Overview:   Smoking 0.5 PPD - smoking cessation encouraged   11/06/15: Nicotine  patches prescribed        Past Surgical History:  Procedure Laterality Date   ANTERIOR CRUCIATE  LIGAMENT REPAIR Left 2008   CESAREAN SECTION N/A 05/07/2016   Procedure: CESAREAN SECTION;  Surgeon: Heather Penton, MD;  Location: [facility] ORS;  Service: Obstetrics;  Laterality: N/A;  Female born @ 28 Apgars: 9/9 Weight:7lb 11oz    Allergies  Allergen Reactions   Other Other (See Comments)    An abx, doesn't remember name. Stomach upset    Azithromycin  Diarrhea   Nitrofurantoin Rash    Other reaction(s): Other (See Comments)    Current Outpatient Medications  Medication Sig Dispense Refill   amphetamine -dextroamphetamine  (ADDERALL) 30 MG tablet Take 1 tablet by mouth 2 (two) times daily. 60 tablet 0   clindamycin  (CLEOCIN  T) 1 % external solution Apply to face every morning for acne 60 mL 3   clobetasol  cream (TEMOVATE ) 0.05 % Apply to affected areas rash once to twice daily for itch. Avoid face, groin, underarms. 60 g 0   ELIDEL  1 % cream Apply to rash on arm twice a day until improved. 60 g 1   hydrOXYzine  (ATARAX ) 25 MG tablet Take 1 tablet (25 mg total) by mouth 3 (three) times daily as needed. 75 tablet 2   nicotine  (NICODERM CQ  - DOSED IN MG/24 HOURS) 21 mg/24hr patch 21 mg daily.     pantoprazole  (PROTONIX ) 20 MG tablet Take 1 tablet (20 mg total) by mouth daily. 90 tablet 1  predniSONE  (DELTASONE ) 20 MG tablet Take 2 tablets (40 mg total) by mouth daily with breakfast. 10 tablet 0   QUEtiapine  (SEROQUEL ) 100 MG tablet Take 1 tablet (100 mg total) by mouth at bedtime. 90 tablet 0   sertraline  (ZOLOFT ) 100 MG tablet Take 1 tablet (100 mg total) by mouth daily. 90 tablet 0   tiZANidine (ZANAFLEX) 4 MG tablet Take 1 tablet (4 mg total) by mouth 3 (three) times daily. 30 tablet 2   No current facility-administered medications for this visit.    Family History Family History  Problem Relation Age of Onset   Alcohol abuse Other    Heart disease Father        Social History Social History   Tobacco Use   Smoking status: Every Day    Current packs/day: 0.25    Types:  Cigarettes    Passive exposure: Never   Smokeless tobacco: Never  Vaping Use   Vaping status: Never Used  Substance Use Topics   Alcohol use: No   Drug use: Not Currently    Comment: last use October 2020        ROS Full ROS of systems performed and is otherwise negative there than what is stated in the HPI  Physical Exam Blood pressure 123/84, pulse 97, temperature 98.2 F (36.8 C), temperature source Oral, height 5' 6 (1.676 m), weight 143 lb (64.9 kg), SpO2 98%, unknown if currently breastfeeding.  Alert and oriented x 3, no work of breathing room air, regular rate and rhythm, abdomen soft, nontender nondistended, gluteal cleft exam performed in the presence of a chaperone.  She has a low burden of hair on the gluteal cleft, there are 2-3 pits that do not have any hair emanating from them.  There is no fluctuance or erythema around these.  There is an area where there was an I&D that is healing up well. Data Reviewed I reviewed her data from the urgent care and she had an I&D for pilonidal cyst abscess.  I have personally reviewed the patient's imaging and medical records.    Assessment    Patient with pilonidal cyst disease.  She has had previous abscesses in the past but no evidence of abscess today on exam.  Plan    I discussed with him the natural history of pilonidal cyst disease.  I also discussed the treatment options to include minimally invasive excision of pilonidal cyst disease, wide local excision and flap techniques for treatment of the disease.  I discussed with her that given her low hair burden I would like to start with a gipps procedure or minimally invasive pilonidal cyst excision.  I discussed the risk, benefits alternatives of the procedure including risk of infection, bleeding and recurrence of disease.  She understands these risk and wishes to proceed.  A total of 45 minutes was spent reviewing the patient's chart, performing history and physical and  discussing treatment options with the patient    Jayson [name]

## 2024-01-12 ENCOUNTER — Encounter
Admission: RE | Admit: 2024-01-12 | Discharge: 2024-01-12 | Disposition: A | Discharge status: Home / Self Care | Source: Ambulatory Visit | Attending: General Surgery | Admitting: General Surgery

## 2024-01-12 ENCOUNTER — Other Ambulatory Visit: Payer: Self-pay

## 2024-01-12 VITALS — Ht 66.0 in | Wt 140.0 lb

## 2024-01-12 DIAGNOSIS — Z01818 Encounter for other preprocedural examination: Secondary | ICD-10-CM

## 2024-01-12 HISTORY — DX: Gastro-esophageal reflux disease without esophagitis: K21.9

## 2024-01-12 NOTE — Patient Instructions (Signed)
 Your procedure is scheduled on: Friday 01/15/24 Report to the Registration Desk on the 1st floor of the [facility]. To find out your arrival time, please call [phone] between 1PM - 3PM on: Thursday 01/14/24 If your arrival time is 6:00 am, do not arrive before that time as the [facility] entrance doors do not open until 6:00 am.  REMEMBER: Instructions that are not followed completely may result in serious medical risk, up to and including death; or upon the discretion of your surgeon and anesthesiologist your surgery may need to be rescheduled.  Do not eat food or drink any liquids after midnight the night before surgery.  No gum chewing or hard candies.  One week prior to surgery: Stop Anti-inflammatories (NSAIDS) such as Advil , Aleve, Ibuprofen , Motrin , Naproxen, Naprosyn and Aspirin based products such as Excedrin, Goody's Powder, BC Powder.  You may however, continue to take Tylenol  if needed for pain up until the day of surgery.  Stop ANY OVER THE COUNTER supplements and vitamins until after surgery.  Continue taking all of your other prescription medications up until the day of surgery.  ON THE DAY OF SURGERY ONLY TAKE THESE MEDICATIONS WITH SIPS OF WATER:  pantoprazole  (PROTONIX ) 20 MG tablet  hydrOXYzine  (ATARAX ) 25 MG tablet if needed  No Alcohol for 24 hours before or after surgery.  No Smoking including e-cigarettes for 24 hours before surgery.  No chewable tobacco products for at least 6 hours before surgery.  No nicotine  patches on the day of surgery.  Do not use any recreational drugs for at least a week (preferably 2 weeks) before your surgery.  Please be advised that the combination of cocaine and anesthesia may have negative outcomes, up to and including death. If you test positive for cocaine, your surgery will be cancelled.  On the morning of surgery brush your teeth with toothpaste and water, you may rinse your mouth with mouthwash if you wish. Do  not swallow any toothpaste or mouthwash.  Use CHG Soap or wipes as directed on instruction sheet.  Do not wear lotions, powders, or perfumes.  Do not shave body hair from the neck down 48 hours before surgery.  Wear comfortable clothing (specific to your surgery type) to the hospital.  Do not wear jewelry, make-up, hairpins, clips or nail polish.  For welded (permanent) jewelry: bracelets, anklets, waist bands, etc.  Please have this removed prior to surgery.  If it is not removed, there is a chance that hospital personnel will need to cut it off on the day of surgery.  Contact lenses, hearing aids and dentures may not be worn into surgery.  Do not bring valuables to the hospital. [facility] is not responsible for any missing/lost belongings or valuables.   Notify your doctor if there is any change in your medical condition (cold, fever, infection).  After surgery, you can help prevent lung complications by doing breathing exercises.  Take deep breaths and cough every 1-2 hours. Your doctor may order a device called an Incentive Spirometer to help you take deep breaths.  If you are being discharged the day of surgery, you will not be allowed to drive home. You will need a responsible individual to drive you home and stay with you for 24 hours after surgery.   Please call the Pre-admissions Testing Dept. at [phone] if you have any questions about these instructions.  Surgery Visitation Policy:  Patients having surgery or a procedure may have two visitors.  Children under  the age of 33 must have an adult with them who is not the patient.   Merchandiser, retail to address health-related social needs:  [email or web address]                                                                                                             Preparing for Surgery with CHLORHEXIDINE GLUCONATE (CHG) Soap  Chlorhexidine Gluconate (CHG) Soap  o An antiseptic cleaner  that kills germs and bonds with the skin to continue killing germs even after washing  o Used for showering the night before surgery and morning of surgery  Before surgery, you can play an important role by reducing the number of germs on your skin.  CHG (Chlorhexidine gluconate) soap is an antiseptic cleanser which kills germs and bonds with the skin to continue killing germs even after washing.  Please do not use if you have an allergy to CHG or antibacterial soaps. If your skin becomes reddened/irritated stop using the CHG.  1. Shower the NIGHT BEFORE SURGERY with CHG soap.  2. If you choose to wash your hair, wash your hair first as usual with your normal shampoo.  3. After shampooing, rinse your hair and body thoroughly to remove the shampoo.  4. Use CHG as you would any other liquid soap. You can apply CHG directly to the skin and wash gently with a clean washcloth.  5. Apply the CHG soap to your body only from the neck down. Do not use on open wounds or open sores. Avoid contact with your eyes, ears, mouth, and genitals (private parts). Wash face and genitals (private parts) with your normal soap.  6. Wash thoroughly, paying special attention to the area where your surgery will be performed.  7. Thoroughly rinse your body with warm water.  8. Do not shower/wash with your normal soap after using and rinsing off the CHG soap.  9. Do not use lotions, oils, etc., after showering with CHG.  10. Pat yourself dry with a clean towel.  11. Wear clean pajamas to bed the night before surgery.  12. Place clean sheets on your bed the night of your shower and do not sleep with pets.  13. Do not apply any deodorants/lotions/powders.  14. Please wear clean clothes to the hospital.  15. Remember to brush your teeth with your regular toothpaste.

## 2024-01-15 ENCOUNTER — Ambulatory Visit
Admission: RE | Admit: 2024-01-15 | Discharge: 2024-01-15 | Disposition: A | Discharge status: Home / Self Care | Attending: General Surgery | Admitting: General Surgery

## 2024-01-15 ENCOUNTER — Other Ambulatory Visit: Payer: Self-pay

## 2024-01-15 ENCOUNTER — Encounter: Payer: Self-pay | Admitting: General Surgery

## 2024-01-15 ENCOUNTER — Ambulatory Visit: Payer: Self-pay | Admitting: Urgent Care

## 2024-01-15 ENCOUNTER — Ambulatory Visit

## 2024-01-15 ENCOUNTER — Encounter
Admission: RE | Disposition: A | Discharge status: Home / Self Care | Payer: Self-pay | Source: Home / Self Care | Attending: General Surgery

## 2024-01-15 DIAGNOSIS — F419 Anxiety disorder, unspecified: Secondary | ICD-10-CM | POA: Insufficient documentation

## 2024-01-15 DIAGNOSIS — K219 Gastro-esophageal reflux disease without esophagitis: Secondary | ICD-10-CM | POA: Insufficient documentation

## 2024-01-15 DIAGNOSIS — Z01818 Encounter for other preprocedural examination: Secondary | ICD-10-CM

## 2024-01-15 DIAGNOSIS — L0591 Pilonidal cyst without abscess: Secondary | ICD-10-CM | POA: Insufficient documentation

## 2024-01-15 DIAGNOSIS — F32A Depression, unspecified: Secondary | ICD-10-CM | POA: Insufficient documentation

## 2024-01-15 DIAGNOSIS — F17210 Nicotine dependence, cigarettes, uncomplicated: Secondary | ICD-10-CM | POA: Diagnosis not present

## 2024-01-15 HISTORY — PX: PILONIDAL CYST EXCISION: SHX744

## 2024-01-15 LAB — POCT PREGNANCY, URINE: Preg Test, Ur: NEGATIVE

## 2024-01-15 SURGERY — EXCISION, PILONIDAL CYST, EXTENSIVE
Anesthesia: General

## 2024-01-15 MED ORDER — MIDAZOLAM HCL 2 MG/2ML IJ SOLN
INTRAMUSCULAR | Status: AC
  Filled 2024-01-15: qty 2

## 2024-01-15 MED ORDER — GLYCOPYRROLATE 0.2 MG/ML IJ SOLN
INTRAMUSCULAR | Status: AC
  Filled 2024-01-15: qty 1

## 2024-01-15 MED ORDER — CHLORHEXIDINE GLUCONATE CLOTH 2 % EX PADS
6.0000 | MEDICATED_PAD | Freq: Once | CUTANEOUS | Status: AC
  Administered 2024-01-15: 6 via TOPICAL

## 2024-01-15 MED ORDER — PROPOFOL 10 MG/ML IV BOLUS
INTRAVENOUS | Status: AC
  Filled 2024-01-15: qty 20

## 2024-01-15 MED ORDER — KETAMINE HCL 50 MG/5ML IJ SOSY
PREFILLED_SYRINGE | INTRAMUSCULAR | Status: AC
  Filled 2024-01-15: qty 5

## 2024-01-15 MED ORDER — CHLORHEXIDINE GLUCONATE 0.12 % MT SOLN
OROMUCOSAL | Status: AC
  Filled 2024-01-15: qty 15

## 2024-01-15 MED ORDER — GLYCOPYRROLATE 0.2 MG/ML IJ SOLN
INTRAMUSCULAR | Status: DC | PRN
  Administered 2024-01-15: .2 mg via INTRAVENOUS

## 2024-01-15 MED ORDER — CHLORHEXIDINE GLUCONATE 0.12 % MT SOLN
15.0000 mL | Freq: Once | OROMUCOSAL | Status: AC
  Administered 2024-01-15: 15 mL via OROMUCOSAL

## 2024-01-15 MED ORDER — BUPIVACAINE-EPINEPHRINE (PF) 0.5% -1:200000 IJ SOLN
INTRAMUSCULAR | Status: AC
  Filled 2024-01-15: qty 30

## 2024-01-15 MED ORDER — PROPOFOL 1000 MG/100ML IV EMUL
INTRAVENOUS | Status: AC
  Filled 2024-01-15: qty 100

## 2024-01-15 MED ORDER — LACTATED RINGERS IV SOLN: INTRAVENOUS | Status: DC

## 2024-01-15 MED ORDER — LIDOCAINE HCL (PF) 2 % IJ SOLN
INTRAMUSCULAR | Status: DC | PRN
  Administered 2024-01-15: 40 mg via INTRADERMAL

## 2024-01-15 MED ORDER — PROPOFOL 10 MG/ML IV BOLUS
INTRAVENOUS | Status: DC | PRN
  Administered 2024-01-15 (×2): 50 mg via INTRAVENOUS
  Administered 2024-01-15: 100 mg via INTRAVENOUS
  Administered 2024-01-15: 150 ug/kg/min via INTRAVENOUS

## 2024-01-15 MED ORDER — KETAMINE HCL 50 MG/5ML IJ SOSY
PREFILLED_SYRINGE | INTRAMUSCULAR | Status: DC | PRN
  Administered 2024-01-15: 20 mg via INTRAVENOUS
  Administered 2024-01-15: 10 mg via INTRAVENOUS

## 2024-01-15 MED ORDER — CHLORHEXIDINE GLUCONATE CLOTH 2 % EX PADS: 6.0000 | MEDICATED_PAD | Freq: Once | CUTANEOUS | Status: DC

## 2024-01-15 MED ORDER — CEFAZOLIN SODIUM-DEXTROSE 2-4 GM/100ML-% IV SOLN
2.0000 g | INTRAVENOUS | Status: AC
  Administered 2024-01-15: 2 g via INTRAVENOUS

## 2024-01-15 MED ORDER — ORAL CARE MOUTH RINSE: 15.0000 mL | Freq: Once | OROMUCOSAL | Status: AC

## 2024-01-15 MED ORDER — LIDOCAINE HCL (PF) 2 % IJ SOLN
INTRAMUSCULAR | Status: AC
  Filled 2024-01-15: qty 5

## 2024-01-15 MED ADMIN — Ondansetron HCl Inj 4 MG/2ML (2 MG/ML): 4 mg | INTRAVENOUS | NDC 60505613005

## 2024-01-15 MED ADMIN — Bupivacaine Inj 0.5% w/ Epinephrine 1:200000 (PF): 30 mL | NDC 00409174970

## 2024-01-15 MED ADMIN — Midazolam HCl Inj PF 2 MG/2ML (Base Equivalent): 2 mg | INTRAVENOUS | NDC 00409000125

## 2024-01-15 MED FILL — Cefazolin Sodium-Dextrose IV Solution 2 GM/100ML-4%: INTRAVENOUS | Qty: 100 | Status: AC

## 2024-01-15 SURGICAL SUPPLY — 26 items
BLADE CLIPPER SURG (BLADE) IMPLANT
BLADE SURG 15 STRL LF DISP TIS (BLADE) ×1 IMPLANT
BRIEF MESH DISP 2XL (UNDERPADS AND DIAPERS) ×1 IMPLANT
DRAPE LAPAROTOMY 100X77 ABD (DRAPES) ×1 IMPLANT
DRSG GAUZE FLUFF 36X18 (GAUZE/BANDAGES/DRESSINGS) ×1 IMPLANT
ELECTRODE REM PT RTRN 9FT ADLT (ELECTROSURGICAL) ×1 IMPLANT
GAUZE 4X4 16PLY ~~LOC~~+RFID DBL (SPONGE) IMPLANT
GAUZE SPONGE 4X4 12PLY STRL (GAUZE/BANDAGES/DRESSINGS) ×1 IMPLANT
GLOVE BIOGEL PI IND STRL 7.5 (GLOVE) ×1 IMPLANT
GLOVE SURG SYN 7.0 PF PI (GLOVE) ×1 IMPLANT
GOWN STRL REUS W/ TWL LRG LVL3 (GOWN DISPOSABLE) ×2 IMPLANT
IV CATH ANGIO 14GX3.25 ORG (MISCELLANEOUS) IMPLANT
MANIFOLD NEPTUNE II (INSTRUMENTS) ×1 IMPLANT
NDL HYPO 22X1.5 SAFETY MO (MISCELLANEOUS) ×1 IMPLANT
NEEDLE HYPO 22X1.5 SAFETY MO (MISCELLANEOUS) ×1 IMPLANT
NS IRRIG 500ML POUR BTL (IV SOLUTION) ×1 IMPLANT
PACK BASIN MINOR ARMC (MISCELLANEOUS) ×1 IMPLANT
PUNCH BIOPSY DISP 4 (MISCELLANEOUS) IMPLANT
SOLUTION PREP PVP 2OZ (MISCELLANEOUS) ×1 IMPLANT
SUT ETHILON 2 0 FS 18 (SUTURE) IMPLANT
SUT VIC AB 2-0 CT1 (SUTURE) IMPLANT
SUT VIC AB 2-0 CT1 TAPERPNT 27 (SUTURE) IMPLANT
SYR 10ML LL (SYRINGE) ×1 IMPLANT
SYR BULB IRRIG 60ML STRL (SYRINGE) ×1 IMPLANT
TRAP FLUID SMOKE EVACUATOR (MISCELLANEOUS) ×1 IMPLANT
WATER STERILE IRR 500ML POUR (IV SOLUTION) ×1 IMPLANT

## 2024-01-15 NOTE — H&P (Signed)
 No changes to below H and P, proceed as planned  CC: Pilonidal Cyst Disease History of Present Illness Doris Roberson is a 33 y.o. female with .  Past medical history as below who presents in consultation for pilonidal cyst disease.  The patient reports that several weeks ago she had an area of swelling and pain in her gluteal cleft.  She went to the urgent care and there was found to have a pilonidal cyst abscess.  She had an incision and drainage of the abscess and was started on antibiotics.  She reports since then the area has healed well.  She has no further drainage.  She does report that previous to this episode she did have 1 episode of pilonidal cyst disease in the past.  She denies any blood in her stool.  She denies any fevers or   Past Medical History     Past Medical History:  Diagnosis Date   Alcohol abuse      s/p inpatient rehab at [facility] in 2011, two DUI's   Bacteria in urine 04/14/2016   Depression      h/o Tylenol  pm overdose, subsequent admision to behavioral health   First trimester screening 10/22/2015   Hyperemesis complicating pregnancy, antepartum 03/15/2016   Labor and delivery, indication for care 05/05/2016   Mood disorder     Pregnant state, incidental 08/14/2010   Rh negative status during pregnancy in second trimester 01/31/2016    Overview:   Rhogam given on 02/15/16   Smoking (tobacco) complicating pregnancy, third trimester 01/31/2016    Overview:   Smoking 0.5 PPD, sometimes less 6 cigs/day   Growth US  every 4 weeks while smoking 0.5 PPD - encouraged to quit  NSTs starting at 36 weeks if she is still smoking 0.5 PPD  Pt is smoking < 1/2 pack a day. TJS stated we do not need NST weekly now.    Tobacco use in pregnancy, antepartum 10/05/2015    Overview:   Smoking 0.5 PPD - smoking cessation encouraged   11/06/15: Nicotine  patches prescribed                  Past Surgical History:  Procedure Laterality Date   ANTERIOR CRUCIATE LIGAMENT  REPAIR Left 2008   CESAREAN SECTION N/A 05/07/2016    Procedure: CESAREAN SECTION;  Surgeon: Heather Penton, MD;  Location: [facility] ORS;  Service: Obstetrics;  Laterality: N/A;  Female born @ [birth year] Apgars: 9/9 Weight:7lb 11oz          Allergies       Allergies  Allergen Reactions   Other Other (See Comments)      An abx, doesn't remember name. Stomach upset     Azithromycin  Diarrhea   Nitrofurantoin Rash      Other reaction(s): Other (See Comments)              Current Outpatient Medications  Medication Sig Dispense Refill   amphetamine -dextroamphetamine  (ADDERALL) 30 MG tablet Take 1 tablet by mouth 2 (two) times daily. 60 tablet 0   clindamycin  (CLEOCIN  T) 1 % external solution Apply to face every morning for acne 60 mL 3   clobetasol  cream (TEMOVATE ) 0.05 % Apply to affected areas rash once to twice daily for itch. Avoid face, groin, underarms. 60 g 0   ELIDEL  1 % cream Apply to rash on arm twice a day until improved. 60 g 1   hydrOXYzine  (ATARAX ) 25 MG tablet Take 1 tablet (25 mg total) by mouth 3 (  three) times daily as needed. 75 tablet 2   nicotine  (NICODERM CQ  - DOSED IN MG/24 HOURS) 21 mg/24hr patch 21 mg daily.       pantoprazole  (PROTONIX ) 20 MG tablet Take 1 tablet (20 mg total) by mouth daily. 90 tablet 1   predniSONE  (DELTASONE ) 20 MG tablet Take 2 tablets (40 mg total) by mouth daily with breakfast. 10 tablet 0   QUEtiapine  (SEROQUEL ) 100 MG tablet Take 1 tablet (100 mg total) by mouth at bedtime. 90 tablet 0   sertraline  (ZOLOFT ) 100 MG tablet Take 1 tablet (100 mg total) by mouth daily. 90 tablet 0   tiZANidine (ZANAFLEX) 4 MG tablet Take 1 tablet (4 mg total) by mouth 3 (three) times daily. 30 tablet 2      No current facility-administered medications for this visit.        Family History      Family History  Problem Relation Age of Onset   Alcohol abuse Other     Heart disease Father              Social History Social History  Social History          Tobacco Use   Smoking status: Every Day      Current packs/day: 0.25      Types: Cigarettes      Passive exposure: Never   Smokeless tobacco: Never  Vaping Use   Vaping status: Never Used  Substance Use Topics   Alcohol use: No   Drug use: Not Currently      Comment: last use October 2020            ROS Full ROS of systems performed and is otherwise negative there than what is stated in the HPI   Physical Exam Blood pressure 123/84, pulse 97, temperature 98.2 F (36.8 C), temperature source Oral, height 5' 6 (1.676 m), weight 143 lb (64.9 kg), SpO2 98%, unknown if currently breastfeeding.   Alert and oriented x 3, no work of breathing room air, regular rate and rhythm, abdomen soft, nontender nondistended, gluteal cleft exam performed in the presence of a chaperone.  She has a low burden of hair on the gluteal cleft, there are 2-3 pits that do not have any hair emanating from them.  There is no fluctuance or erythema around these.  There is an area where there was an I&D that is healing up well. Data Reviewed I reviewed her data from the urgent care and she had an I&D for pilonidal cyst abscess.   I have personally reviewed the patient's imaging and medical records.     Assessment Assessment Patient with pilonidal cyst disease.  She has had previous abscesses in the past but no evidence of abscess today on exam.   Plan Plan I discussed with him the natural history of pilonidal cyst disease.  I also discussed the treatment options to include minimally invasive excision of pilonidal cyst disease, wide local excision and flap techniques for treatment of the disease.  I discussed with her that given her low hair burden I would like to start with a gipps procedure or minimally invasive pilonidal cyst excision.  I discussed the risk, benefits alternatives of the procedure including risk of infection, bleeding and recurrence of disease.  She understands these risk and wishes to  proceed.   A total of 45 minutes was spent reviewing the patient's chart, performing history and physical and discussing treatment options with the patient  Jayson [name]

## 2024-01-15 NOTE — Anesthesia Postprocedure Evaluation (Signed)
 Anesthesia Post Note  Patient: Doris Roberson  Procedure(s) Performed: EXCISION, PILONIDAL CYST, EXTENSIVE  Patient location during evaluation: PACU Anesthesia Type: General Level of consciousness: awake and alert Pain management: pain level controlled Vital Signs Assessment: post-procedure vital signs reviewed and stable Respiratory status: spontaneous breathing, nonlabored ventilation, respiratory function stable and patient connected to nasal cannula oxygen Cardiovascular status: blood pressure returned to baseline and stable Postop Assessment: no apparent nausea or vomiting Anesthetic complications: no   No notable events documented.   Last Vitals:  Vitals:   01/15/24 1400 01/15/24 1419  BP: 96/78 119/85  Pulse: 78 74  Resp: 19 20  Temp:    SpO2: 100% 97%    Last Pain:  Vitals:   01/15/24 1349  TempSrc:   PainSc: 0-No pain                 Doris Roberson

## 2024-01-15 NOTE — Anesthesia Preprocedure Evaluation (Signed)
 Anesthesia Evaluation  Patient identified by MRN, date of birth, ID band Patient awake    Reviewed: Allergy & Precautions, H&P , NPO status , Patient's Chart, lab work & pertinent test results, reviewed documented beta blocker date and time   History of Anesthesia Complications Negative for: history of anesthetic complications  Airway Mallampati: III  TM Distance: >3 FB Neck ROM: full    Dental  (+) Dental Advidsory Given, Teeth Intact, Chipped   Pulmonary neg shortness of breath, neg sleep apnea, neg COPD, neg recent URI, Current Smoker   Pulmonary exam normal breath sounds clear to auscultation       Cardiovascular Exercise Tolerance: Good negative cardio ROS Normal cardiovascular exam Rhythm:regular Rate:Normal     Neuro/Psych  PSYCHIATRIC DISORDERS Anxiety Depression    negative neurological ROS     GI/Hepatic Neg liver ROS,GERD  ,,  Endo/Other  negative endocrine ROS    Renal/GU negative Renal ROS  negative genitourinary   Musculoskeletal   Abdominal   Peds  Hematology negative hematology ROS (+)   Anesthesia Other Findings Past Medical History: No date: Alcohol abuse     Comment:  s/p inpatient rehab at [facility] in 2011, two               DUI's 04/14/2016: Bacteria in urine No date: Depression     Comment:  h/o Tylenol  pm overdose, subsequent admision to               behavioral health 10/22/2015: First trimester screening No date: GERD (gastroesophageal reflux disease) 03/15/2016: Hyperemesis complicating pregnancy, antepartum 05/05/2016: Labor and delivery, indication for care No date: Mood disorder 08/14/2010: Pregnant state, incidental 01/31/2016: Rh negative status during pregnancy in second trimester     Comment:  Overview:   Rhogam given on 02/15/16 01/31/2016: Smoking (tobacco) complicating pregnancy, third trimester     Comment:  Overview:   Smoking 0.5 PPD, sometimes less 6 cigs/day                 Growth US  every 4 weeks while smoking 0.5 PPD -               encouraged to quit  NSTs starting at 36 weeks if she is               still smoking 0.5 PPD  Pt is smoking < 1/2 pack a day.               TJS stated we do not need NST weekly now.  10/05/2015: Tobacco use in pregnancy, antepartum     Comment:  Overview:   Smoking 0.5 PPD - smoking cessation               encouraged   11/06/15: Nicotine  patches prescribed    Reproductive/Obstetrics negative OB ROS                              Anesthesia Physical Anesthesia Plan  ASA: 2  Anesthesia Plan: General   Post-op Pain Management:    Induction: Intravenous  PONV Risk Score and Plan: 2 and Propofol infusion, TIVA and Treatment may vary due to age or medical condition  Airway Management Planned: Natural Airway and Simple Face Mask  Additional Equipment:   Intra-op Plan:   Post-operative Plan:   Informed Consent: I have reviewed the patients History and Physical, chart, labs and discussed the procedure including the risks, benefits and alternatives  for the proposed anesthesia with the patient or authorized representative who has indicated his/her understanding and acceptance.     Dental Advisory Given  Plan Discussed with: Anesthesiologist, CRNA and Surgeon  Anesthesia Plan Comments:          Anesthesia Quick Evaluation

## 2024-01-15 NOTE — Transfer of Care (Signed)
 Immediate Anesthesia Transfer of Care Note  Patient: Doris Roberson  Procedure(s) Performed: EXCISION, PILONIDAL CYST, EXTENSIVE  Patient Location: PACU  Anesthesia Type:MAC  Level of Consciousness: awake and drowsy  Airway & Oxygen Therapy: Patient Spontanous Breathing and Patient connected to face mask oxygen  Post-op Assessment: Report given to RN  Post vital signs: Reviewed and stable  Last Vitals:  Vitals Value Taken Time  BP 97/78 01/15/24 13:48  Temp    Pulse 78 01/15/24 13:50  Resp 14 01/15/24 13:50  SpO2 100 % 01/15/24 13:50  Vitals shown include unfiled device data.  Last Pain:  Vitals:   01/15/24 1130  TempSrc: Temporal  PainSc: 0-No pain         Complications: No notable events documented.

## 2024-01-15 NOTE — Op Note (Signed)
 Operative Note  Pre-Op Diagnosis: Pilnoidal Cyst  Post-Op Diagnosis: Pilonidal Cyst Surgeon: Jayson Hobby, MD EBL: 15 cc Procedure: Excision of pilonidal cyst disease  After informed consent was obtained the patient was brought to the operating room and she was placed prone on the operating table.  Monitored anesthesia care was then induced.  Her buttock was then prepped and draped in the usual sterile fashion.  A surgical timeout was called identifying correct patient, site, side and procedure.  There was an area of small pits where there had been an obvious abscess in the past.  Using a 6 mm punch biopsy this pit was cored out.  There was some very very small pits just medial to this in the midline and this was cored out with a 6 mm punch biopsy as well.  The underlying tissue was then debrided with a curette and there was return of fibrinous tissue but no hair  The wound was then irrigated and hemostasis is obtained.  There is noted to be no further pits or tracts.  Local anesthetic was injected into the surrounding skin.  The skin was dressed with gauze and fluff as well as a mesh panties.  The patient was then placed supine on the hospital gurney and for MAC.  She was transferred to the back in good condition.  Prior to termination of the procedure all sponge and instrument counts were correct x 2.

## 2024-01-18 ENCOUNTER — Encounter: Payer: Self-pay | Admitting: General Surgery

## 2024-01-26 ENCOUNTER — Encounter: Payer: Self-pay | Admitting: General Surgery

## 2024-02-02 ENCOUNTER — Other Ambulatory Visit: Payer: Self-pay | Admitting: Family Medicine

## 2024-02-02 DIAGNOSIS — M542 Cervicalgia: Secondary | ICD-10-CM

## 2024-02-11 ENCOUNTER — Encounter: Payer: Self-pay | Admitting: Family Medicine

## 2024-02-24 ENCOUNTER — Other Ambulatory Visit: Payer: Self-pay | Admitting: Family Medicine

## 2024-02-24 ENCOUNTER — Encounter: Payer: Self-pay | Admitting: Family Medicine

## 2024-02-24 DIAGNOSIS — S139XXD Sprain of joints and ligaments of unspecified parts of neck, subsequent encounter: Secondary | ICD-10-CM

## 2024-02-24 MED ORDER — TIZANIDINE HCL 4 MG PO TABS: 4.0000 mg | ORAL_TABLET | Freq: Every evening | ORAL | 1 refills | Status: DC

## 2024-03-30 ENCOUNTER — Encounter: Payer: Self-pay | Admitting: Internal Medicine

## 2024-03-30 ENCOUNTER — Ambulatory Visit: Admitting: Internal Medicine

## 2024-03-30 ENCOUNTER — Ambulatory Visit: Payer: Self-pay

## 2024-03-30 VITALS — BP 110/64 | Ht 66.0 in | Wt 147.2 lb

## 2024-03-30 DIAGNOSIS — M79644 Pain in right finger(s): Secondary | ICD-10-CM

## 2024-03-30 MED ORDER — MELOXICAM 7.5 MG PO TABS: 7.5000 mg | ORAL_TABLET | Freq: Every day | ORAL | 0 refills | Status: AC

## 2024-03-30 NOTE — Patient Instructions (Signed)
 Joint Pain  Joint pain can be caused by many things. It may go away if you follow instructions from your health care provider for taking care of yourself at home. Sometimes, you may need more treatment. Joint pain can be caused by: Bruises at the area of the joint. An injury caused by movements that are repeated. Wear and tear on the joint as you get older. Buildup of uric acid crystals in the joint. This is also called gout. Irritation and swelling of the joint. Types of arthritis. Infections of the joint or of the bone. Your provider may tell you to take pain medicine or wear an elastic bandage, sling, or splint. If your joint pain continues, you may need lab or imaging tests to find the cause of your joint pain. Follow these instructions at home: If you have an elastic bandage, sling, or splint that can be taken off: Wear the bandage, sling, or splint as told by your provider. Take it off only if your provider says you can. Check the skin under and around it every day. Tell your provider if you see problems. Loosen it if your fingers or toes tingle, are numb, or turn cold and blue. Keep it clean and dry. Ask your provider if you should remove it before bathing. If the bandage, sling, or splint is not waterproof: Do not let it get wet. Cover it when you take a bath or shower. Use a cover that does not let any water in. Managing pain, stiffness, and swelling     If told, put ice on the area. If you have an elastic bandage, sling, or splint that you can take off, remove it as told. Put ice in a plastic bag. Place a towel between your skin and the bag. Leave the ice on for 20 minutes, 2-3 times a day. If told, put heat on the area. Do this as often as told. Use the heat source that your provider recommends, such as a moist heat pack or a heating pad. Place a towel between your skin and the heat source. Leave the heat on for 20-30 minutes. If your skin turns bright red, take off the  ice or heat right away to prevent skin damage. The risk of damage is higher if you can't feel pain, heat, or cold. Move your fingers or toes often to reduce stiffness and swelling. Raise the injured area above the level of your heart while you're sitting or lying down. Use a pillow to support the painful area as needed. Activity Rest the painful joint as told. Do not do things that cause pain or make pain worse. Begin exercising or stretching the affected area as told by your provider. Return to normal activities when you are told. Ask what things are safe for you to do. General instructions Take your medicines as told by your provider. Treatment may include medicines for pain and swelling that are taken by mouth or applied to the skin. Do not smoke, vape, or use products with nicotine or tobacco in them. If you need help quitting, talk with your provider. Keep all follow-up visits. Your provider will want to check on your condition. Contact a health care provider if: You have pain that does not get better with medicine. Your joint pain does not improve within 3 days. You have more bruising or swelling. You have a fever. You lose 10 lb (4.5 kg) or more without trying. Get help right away if: You cannot move the joint. Your fingers  or toes tingle, become numb, or turn cold and blue. You have a fever along with a joint that's red, warm, and swollen. This information is not intended to replace advice given to you by your health care provider. Make sure you discuss any questions you have with your health care provider. Document Revised: 12/18/2022 Document Reviewed: 05/30/2022 Elsevier Patient Education  2024 [employer].

## 2024-03-30 NOTE — Progress Notes (Signed)
 "  Subjective:    Patient ID: Doris Roberson, female    DOB: 1991/02/02, 33 y.o.   MRN: [id number]  HPI  Discussed the use of AI scribe software for clinical note transcription with the patient, who gave verbal consent to proceed.  Doris Roberson is a 33 year old female who presents with right thumb pain and stiffness.  She has been experiencing right thumb pain and stiffness for the past two weeks. The symptoms initially presented as the thumb tensing up in the mornings, and despite taking Advil , the symptoms have worsened. The pain occurs primarily when the thumb has been in one position for a while, such as during sleep or rest, and then she attempts to move it. The pain is localized to a specific area on the thumb, and she has experienced episodes where the thumb locks in position, requiring her to manually release it.  There is no history of injury or fracture to the thumb. She notes a little swelling might be present, but there is no significant swelling. No pain in the rest of her fingers, nor any swelling, numbness, or tingling in the hand. She is left-handed but uses her right hand for typing, which does not exacerbate the symptoms.  She reports taking 800 mg of ibuprofen  twice daily, which helps alleviate the pain but causes stomach discomfort. She has previously taken meloxicam when prescribed by a clinician.       Review of Systems   Past Medical History:  Diagnosis Date   Alcohol abuse    s/p inpatient rehab at [facility] in 2011, two DUI's   Bacteria in urine 04/14/2016   Depression    h/o Tylenol  pm overdose, subsequent admision to behavioral health   First trimester screening 10/22/2015   GERD (gastroesophageal reflux disease)    Hyperemesis complicating pregnancy, antepartum 03/15/2016   Labor and delivery, indication for care 05/05/2016   Mood disorder    Pregnant state, incidental 08/14/2010   Rh negative status during pregnancy in second trimester  01/31/2016   Overview:   Rhogam given on 02/15/16   Smoking (tobacco) complicating pregnancy, third trimester 01/31/2016   Overview:   Smoking 0.5 PPD, sometimes less 6 cigs/day   Growth US  every 4 weeks while smoking 0.5 PPD - encouraged to quit  NSTs starting at 36 weeks if she is still smoking 0.5 PPD  Pt is smoking < 1/2 pack a day. TJS stated we do not need NST weekly now.    Tobacco use in pregnancy, antepartum 10/05/2015   Overview:   Smoking 0.5 PPD - smoking cessation encouraged   11/06/15: Nicotine  patches prescribed     Current Outpatient Medications  Medication Sig Dispense Refill   amphetamine -dextroamphetamine  (ADDERALL) 30 MG tablet Take 1 tablet by mouth 2 (two) times daily. 60 tablet 0   hydrOXYzine  (ATARAX ) 25 MG tablet Take 1 tablet (25 mg total) by mouth 3 (three) times daily as needed. 75 tablet 2   nicotine  (NICODERM CQ  - DOSED IN MG/24 HOURS) 21 mg/24hr patch 14 mg daily.     pantoprazole  (PROTONIX ) 20 MG tablet Take 1 tablet (20 mg total) by mouth daily. 90 tablet 1   QUEtiapine  (SEROQUEL ) 100 MG tablet Take 1 tablet (100 mg total) by mouth at bedtime. 90 tablet 0   sertraline  (ZOLOFT ) 100 MG tablet Take 1 tablet (100 mg total) by mouth daily. (Patient taking differently: Take 100 mg by mouth at bedtime.) 90 tablet 0   tiZANidine  (ZANAFLEX ) 4 MG  tablet Take 1 tablet (4 mg total) by mouth 3 (three) times daily. (Patient taking differently: Take 4 mg by mouth 3 (three) times daily as needed for muscle spasms.) 30 tablet 2   tiZANidine  (ZANAFLEX ) 4 MG tablet Take 1 tablet (4 mg total) by mouth Nightly. 90 tablet 1   No current facility-administered medications for this visit.    Allergies[1]  Family History  Problem Relation Age of Onset   Alcohol abuse Other    Heart disease Father     Social History   Socioeconomic History   Marital status: Single    Spouse name: Not on file   Number of children: Not on file   Years of education: Not on file   Highest  education level: Not on file  Occupational History   Not on file  Tobacco Use   Smoking status: Some Days    Current packs/day: 0.25    Types: Cigarettes    Passive exposure: Never   Smokeless tobacco: Never  Vaping Use   Vaping status: Never Used  Substance and Sexual Activity   Alcohol use: No   Drug use: Not Currently    Comment: last use October 2020   Sexual activity: Yes  Other Topics Concern   Not on file  Social History Narrative   Not on file   Social Drivers of Health   Tobacco Use: High Risk (01/15/2024)   Received from [facility]   Patient History    Smoking Tobacco Use: Every Day    Smokeless Tobacco Use: Never    Passive Exposure: Not on file  Financial Resource Strain: Not on file  Food Insecurity: Low Risk (01/15/2024)   Received from [facility]   Epic    Within the past 12 months, the food you bought just didn't last and you didn't have money to get more: Not on file    Within the past 12 months, you worried that your food would run out before you got money to buy more: Never true  Transportation Needs: No Transportation Needs (01/15/2024)   Received from [facility]    In the past 12 months, has lack of reliable transportation kept you from medical appointments, meetings, work or from getting things needed for daily living? : No  Physical Activity: Not on file  Stress: Not on file  Social Connections: Not on file  Intimate Partner Violence: Not At Risk (04/28/2023)   Humiliation, Afraid, Rape, and Kick questionnaire    Fear of Current or Ex-Partner: No    Emotionally Abused: No    Physically Abused: No    Sexually Abused: No  Depression (PHQ2-9): Low Risk (11/24/2023)   Depression (PHQ2-9)    PHQ-2 Score: 0  Alcohol Screen: Not on file  Housing: Low Risk (01/15/2024)   Received from [facility]   Epic    Think about the place you live. Do you have problems with any of the following? Choose all that apply:: Not on file     What is your living situation today?: I have a steady place to live  Utilities: Not At Risk (04/28/2023)   [facility] Utilities    Threatened with loss of utilities: No  Health Literacy: Adequate Health Literacy (04/28/2023)   B1300 Health Literacy    Frequency of need for help with medical instructions: Never     Constitutional: Denies fever, malaise, fatigue, headache or abrupt weight changes.  Respiratory: Denies difficulty breathing, shortness of breath, cough or sputum production.  Cardiovascular: Denies chest pain, chest tightness, palpitations or swelling in the hands or feet.  Musculoskeletal: Pt reports right thumb pain. Denies decrease in range of motion, difficulty with gait, muscle pain or joint swelling.  Skin: Denies redness, rashes, lesions or ulcercations.  Neurological: Denies numbness, tingling, weakness or problems with balance and coordination.    No other specific complaints in a complete review of systems (except as listed in HPI above).      Objective:   Physical Exam BP 110/64 (BP Location: Left Arm, Patient Position: Sitting, Cuff Size: Normal)   Ht 5' 6 (1.676 m)   Wt 147 lb 3.2 oz (66.8 kg)   LMP 03/16/2024 (Approximate)   BMI 23.76 kg/m   Wt Readings from Last 3 Encounters:  01/12/24 140 lb (63.5 kg)  12/31/23 143 lb (64.9 kg)  04/28/23 133 lb 12.8 oz (60.7 kg)    General: Appears her stated age, well developed, well nourished in NAD. Skin: Warm, dry and intact. No r redness or warmth noted. Cardiovascular: Normal rate and rhythm.  Radial pulse 2+ on the right. Pulmonary/Chest: Normal effort and positive vesicular breath sounds. No respiratory distress. No wheezes, rales or ronchi noted.  Musculoskeletal: Normal flexion and extension of the right thumb.  Pain with palpation of the MP and IP joint of the right thumb.  No signs of joint swelling. No difficulty with gait.  Neurological: Alert and oriented. Coordination normal of the right hand.     BMET    Component Value Date/Time   NA 140 10/15/2018 1951   K 4.0 10/15/2018 1951   CL 104 10/15/2018 1951   CO2 27 10/15/2018 1951   GLUCOSE 82 10/15/2018 1951   BUN 13 10/15/2018 1951   CREATININE 0.69 10/15/2018 1951   CALCIUM 9.2 10/15/2018 1951   GFRNONAA >60 10/15/2018 1951   GFRAA >60 10/15/2018 1951    Lipid Panel  No results found for: CHOL, TRIG, HDL, CHOLHDL, VLDL, LDLCALC  CBC    Component Value Date/Time   WBC 8.3 10/15/2018 1951   RBC 4.57 10/15/2018 1951   HGB 13.3 10/15/2018 1951   HCT 40.6 10/15/2018 1951   PLT 322 10/15/2018 1951   MCV 88.8 10/15/2018 1951   MCH 29.1 10/15/2018 1951   MCHC 32.8 10/15/2018 1951   RDW 13.6 10/15/2018 1951   LYMPHSABS 5.2 (H) 10/05/2018 1924   MONOABS 0.8 10/05/2018 1924   EOSABS 0.1 10/05/2018 1924   BASOSABS 0.1 10/05/2018 1924    Hgb A1C No results found for: HGBA1C          Assessment & Plan:  Assessment and Plan    Right thumb pain 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 Ibuprofen  provides relief but causes stomach discomfort.  - Prescribed meloxicam 7.5 mg, may increase to 15 mg if needed. - Advised against concurrent use of meloxicam and ibuprofen . - Discussed optional x-ray at [facility], however she would like to hold off at this time. - Recommended follow-up with primary care if no improvement.        Followup with your PCP as previously scheduled Angeline Laura, NP     [1]  Allergies Allergen Reactions   Other Other (See Comments)    An abx, doesn't remember name. Stomach upset    Azithromycin  Diarrhea   Nitrofurantoin Rash    Other reaction(s): Other (See Comments)   "

## 2024-03-30 NOTE — Telephone Encounter (Signed)
 FYI Only or Action Required?: FYI only for provider: appointment scheduled on 03/30/2024.  Patient was last seen in primary care on 01/01/2024 by Ziglar, Susan K, MD.  Called Nurse Triage reporting Pain.  Symptoms began x 2 weeks.  Interventions attempted: Nothing.  Symptoms are: gradually worsening.  Triage Disposition: See Physician Within 24 Hours  Patient/caregiver understands and will follow disposition?: Yes   Copied from CRM #[id number]. Topic: Clinical - Red Word Triage >> Mar 30, 2024 10:05 AM Viola FALCON wrote: Red Word that prompted transfer to Nurse Triage: Patient having severe pain - right thumb popping out of place Reason for Disposition  Looks infected (spreading redness, pus)  Answer Assessment - Initial Assessment Questions 1. ONSET: When did the pain start?      X 2 weeks and worsening 2. LOCATION and RADIATION: Where is the pain located?  (e.g., fingertip, around nail, joint, entire      Right thumb 3. SEVERITY: How bad is the pain? What does it keep you from doing?   (Scale 1-10; or mild, moderate, severe)     6/10 4. APPEARANCE: What does the finger look like? (e.g., redness, swelling, bruising, pallor)     Swelling, bruising 5. WORK OR EXERCISE: Has there been any recent work or exercise that involved this part (i.e., fingers or hand) of the body?     na 6. CAUSE: What do you think is causing the pain?     unsure 7. AGGRAVATING FACTORS: What makes the pain worse? (e.g., using computer)     na 8. OTHER SYMPTOMS: Do you have any other symptoms? (e.g., fever, neck pain, numbness)     na 9. PREGNANCY: Is there any chance you are pregnant? When was your last menstrual period?     Na  Possible trigger finger - popping out of place - pain has gotten worse: pt stated now losing function of hand  Protocols used: Finger Pain-A-AH
